# Patient Record
Sex: Female | Born: 1935 | Race: White | Hispanic: No | Smoking: Never smoker
Health system: Southern US, Community
[De-identification: ages and names within clinical notes are randomized; demographics above are authoritative.]

## PROBLEM LIST (undated history)

## (undated) DIAGNOSIS — E0590 Thyrotoxicosis, unspecified without thyrotoxic crisis or storm: Secondary | ICD-10-CM

## (undated) DIAGNOSIS — I509 Heart failure, unspecified: Secondary | ICD-10-CM

## (undated) DIAGNOSIS — J449 Chronic obstructive pulmonary disease, unspecified: Secondary | ICD-10-CM

## (undated) DIAGNOSIS — I10 Essential (primary) hypertension: Secondary | ICD-10-CM

## (undated) DIAGNOSIS — R12 Heartburn: Secondary | ICD-10-CM

## (undated) DIAGNOSIS — I499 Cardiac arrhythmia, unspecified: Secondary | ICD-10-CM

## (undated) DIAGNOSIS — E119 Type 2 diabetes mellitus without complications: Secondary | ICD-10-CM

## (undated) DIAGNOSIS — I519 Heart disease, unspecified: Secondary | ICD-10-CM

## (undated) DIAGNOSIS — I639 Cerebral infarction, unspecified: Secondary | ICD-10-CM

## (undated) DIAGNOSIS — J45909 Unspecified asthma, uncomplicated: Secondary | ICD-10-CM

## (undated) DIAGNOSIS — F32A Depression, unspecified: Secondary | ICD-10-CM

## (undated) DIAGNOSIS — E785 Hyperlipidemia, unspecified: Secondary | ICD-10-CM

## (undated) DIAGNOSIS — M1990 Unspecified osteoarthritis, unspecified site: Secondary | ICD-10-CM

## (undated) DIAGNOSIS — C801 Malignant (primary) neoplasm, unspecified: Secondary | ICD-10-CM

## (undated) DIAGNOSIS — I2510 Atherosclerotic heart disease of native coronary artery without angina pectoris: Secondary | ICD-10-CM

## (undated) DIAGNOSIS — N19 Unspecified kidney failure: Secondary | ICD-10-CM

## (undated) DIAGNOSIS — F329 Major depressive disorder, single episode, unspecified: Secondary | ICD-10-CM

## (undated) DIAGNOSIS — I4891 Unspecified atrial fibrillation: Secondary | ICD-10-CM

## (undated) DIAGNOSIS — I219 Acute myocardial infarction, unspecified: Secondary | ICD-10-CM

## (undated) DIAGNOSIS — G4730 Sleep apnea, unspecified: Secondary | ICD-10-CM

## (undated) HISTORY — DX: Heart disease, unspecified: I51.9

## (undated) HISTORY — DX: Heart failure, unspecified: I50.9

## (undated) HISTORY — DX: Unspecified asthma, uncomplicated: J45.909

## (undated) HISTORY — DX: Sleep apnea, unspecified: G47.30

## (undated) HISTORY — DX: Thyrotoxicosis, unspecified without thyrotoxic crisis or storm: E05.90

## (undated) HISTORY — DX: Acute myocardial infarction, unspecified: I21.9

## (undated) HISTORY — DX: Depression, unspecified: F32.A

## (undated) HISTORY — DX: Unspecified atrial fibrillation: I48.91

## (undated) HISTORY — DX: Cardiac arrhythmia, unspecified: I49.9

## (undated) HISTORY — DX: Hyperlipidemia, unspecified: E78.5

## (undated) HISTORY — DX: Unspecified osteoarthritis, unspecified site: M19.90

## (undated) HISTORY — DX: Unspecified kidney failure: N19

## (undated) HISTORY — PX: EYE SURGERY: SHX253

## (undated) HISTORY — DX: Heartburn: R12

## (undated) HISTORY — DX: Major depressive disorder, single episode, unspecified: F32.9

---

## 1961-05-30 HISTORY — PX: THYROID SURGERY: SHX805

## 1981-05-30 HISTORY — PX: CHOLECYSTECTOMY: SHX55

## 1997-12-05 ENCOUNTER — Emergency Department (HOSPITAL_COMMUNITY): Admission: EM | Admit: 1997-12-05 | Discharge: 1997-12-05 | Payer: Self-pay | Admitting: Emergency Medicine

## 2002-07-14 ENCOUNTER — Encounter: Payer: Self-pay | Admitting: Emergency Medicine

## 2002-07-15 ENCOUNTER — Encounter: Payer: Self-pay | Admitting: Cardiovascular Disease

## 2002-07-16 ENCOUNTER — Inpatient Hospital Stay (HOSPITAL_COMMUNITY): Admission: EM | Admit: 2002-07-16 | Discharge: 2002-07-19 | Payer: Self-pay | Admitting: Emergency Medicine

## 2002-07-19 ENCOUNTER — Inpatient Hospital Stay: Admission: RE | Admit: 2002-07-19 | Discharge: 2002-08-07 | Payer: Self-pay | Admitting: Internal Medicine

## 2002-07-26 ENCOUNTER — Encounter (HOSPITAL_COMMUNITY): Admission: RE | Admit: 2002-07-26 | Discharge: 2002-08-03 | Payer: Self-pay | Admitting: Internal Medicine

## 2002-07-30 ENCOUNTER — Encounter: Payer: Self-pay | Admitting: Neurology

## 2002-08-03 ENCOUNTER — Encounter: Payer: Self-pay | Admitting: Internal Medicine

## 2003-12-19 ENCOUNTER — Other Ambulatory Visit: Payer: Self-pay

## 2003-12-20 ENCOUNTER — Other Ambulatory Visit: Payer: Self-pay

## 2003-12-21 ENCOUNTER — Other Ambulatory Visit: Payer: Self-pay

## 2003-12-23 ENCOUNTER — Inpatient Hospital Stay (HOSPITAL_COMMUNITY): Admission: AD | Admit: 2003-12-23 | Discharge: 2003-12-24 | Payer: Self-pay | Admitting: Cardiovascular Disease

## 2004-02-18 ENCOUNTER — Inpatient Hospital Stay (HOSPITAL_COMMUNITY): Admission: RE | Admit: 2004-02-18 | Discharge: 2004-02-21 | Payer: Self-pay | Admitting: Cardiovascular Disease

## 2004-05-27 ENCOUNTER — Ambulatory Visit (HOSPITAL_COMMUNITY): Admission: RE | Admit: 2004-05-27 | Discharge: 2004-05-28 | Payer: Self-pay | Admitting: Cardiology

## 2005-02-22 ENCOUNTER — Ambulatory Visit: Payer: Self-pay | Admitting: Oncology

## 2005-03-09 ENCOUNTER — Encounter (HOSPITAL_COMMUNITY): Admission: RE | Admit: 2005-03-09 | Discharge: 2005-06-07 | Payer: Self-pay | Admitting: Oncology

## 2005-03-26 ENCOUNTER — Ambulatory Visit: Payer: Self-pay | Admitting: Internal Medicine

## 2005-04-18 ENCOUNTER — Ambulatory Visit: Payer: Self-pay | Admitting: Oncology

## 2005-06-21 ENCOUNTER — Ambulatory Visit: Payer: Self-pay | Admitting: Oncology

## 2005-08-02 ENCOUNTER — Ambulatory Visit: Payer: Self-pay | Admitting: Oncology

## 2005-08-15 ENCOUNTER — Inpatient Hospital Stay (HOSPITAL_COMMUNITY): Admission: EM | Admit: 2005-08-15 | Discharge: 2005-08-19 | Payer: Self-pay | Admitting: Emergency Medicine

## 2005-08-17 ENCOUNTER — Ambulatory Visit: Payer: Self-pay | Admitting: Pulmonary Disease

## 2005-08-19 ENCOUNTER — Encounter: Payer: Self-pay | Admitting: Pulmonary Disease

## 2005-08-26 ENCOUNTER — Ambulatory Visit: Admission: RE | Admit: 2005-08-26 | Discharge: 2005-10-10 | Payer: Self-pay | Admitting: Radiation Oncology

## 2005-08-29 ENCOUNTER — Ambulatory Visit: Payer: Self-pay | Admitting: Pulmonary Disease

## 2005-09-05 ENCOUNTER — Encounter (INDEPENDENT_AMBULATORY_CARE_PROVIDER_SITE_OTHER): Payer: Self-pay | Admitting: *Deleted

## 2005-09-05 ENCOUNTER — Ambulatory Visit (HOSPITAL_COMMUNITY): Admission: RE | Admit: 2005-09-05 | Discharge: 2005-09-05 | Payer: Self-pay | Admitting: Pulmonary Disease

## 2005-09-05 ENCOUNTER — Ambulatory Visit: Payer: Self-pay | Admitting: Pulmonary Disease

## 2005-09-12 ENCOUNTER — Ambulatory Visit: Payer: Self-pay | Admitting: Pulmonary Disease

## 2005-10-11 ENCOUNTER — Ambulatory Visit: Payer: Self-pay | Admitting: Pulmonary Disease

## 2005-10-14 ENCOUNTER — Other Ambulatory Visit: Admission: RE | Admit: 2005-10-14 | Discharge: 2005-10-14 | Payer: Self-pay | Admitting: Radiology

## 2005-10-14 ENCOUNTER — Encounter (INDEPENDENT_AMBULATORY_CARE_PROVIDER_SITE_OTHER): Payer: Self-pay | Admitting: Specialist

## 2005-10-14 ENCOUNTER — Encounter: Admission: RE | Admit: 2005-10-14 | Discharge: 2005-10-14 | Payer: Self-pay | Admitting: Orthopedic Surgery

## 2005-10-23 ENCOUNTER — Emergency Department (HOSPITAL_COMMUNITY): Admission: EM | Admit: 2005-10-23 | Discharge: 2005-10-23 | Payer: Self-pay | Admitting: Emergency Medicine

## 2005-10-25 ENCOUNTER — Ambulatory Visit: Payer: Self-pay | Admitting: Oncology

## 2005-10-26 LAB — CBC WITH DIFFERENTIAL (CANCER CENTER ONLY)
BASO#: 0.1 10*3/uL (ref 0.0–0.2)
Eosinophils Absolute: 0.3 10*3/uL (ref 0.0–0.5)
HCT: 35 % (ref 34.8–46.6)
HGB: 11.8 g/dL (ref 11.6–15.9)
LYMPH%: 24.2 % (ref 14.0–48.0)
MCH: 28.3 pg (ref 26.0–34.0)
MCV: 84 fL (ref 81–101)
MONO#: 0.7 10*3/uL (ref 0.1–0.9)
MONO%: 7.8 % (ref 0.0–13.0)
NEUT%: 63.3 % (ref 39.6–80.0)
Platelets: 213 10*3/uL (ref 145–400)
RBC: 4.17 10*6/uL (ref 3.70–5.32)
WBC: 8.7 10*3/uL (ref 3.9–10.0)

## 2005-10-31 ENCOUNTER — Ambulatory Visit: Payer: Self-pay | Admitting: Pulmonary Disease

## 2005-11-14 ENCOUNTER — Encounter: Admission: RE | Admit: 2005-11-14 | Discharge: 2005-11-14 | Payer: Self-pay | Admitting: Orthopedic Surgery

## 2005-11-14 ENCOUNTER — Encounter (INDEPENDENT_AMBULATORY_CARE_PROVIDER_SITE_OTHER): Payer: Self-pay | Admitting: Specialist

## 2005-12-05 LAB — CBC WITH DIFFERENTIAL (CANCER CENTER ONLY)
BASO#: 0 10*3/uL (ref 0.0–0.2)
HCT: 34.7 % — ABNORMAL LOW (ref 34.8–46.6)
HGB: 11.5 g/dL — ABNORMAL LOW (ref 11.6–15.9)
LYMPH#: 1.8 10*3/uL (ref 0.9–3.3)
MONO#: 0.4 10*3/uL (ref 0.1–0.9)
NEUT%: 65.8 % (ref 39.6–80.0)
RBC: 3.87 10*6/uL (ref 3.70–5.32)
WBC: 7.1 10*3/uL (ref 3.9–10.0)

## 2005-12-08 ENCOUNTER — Ambulatory Visit: Payer: Self-pay | Admitting: Pulmonary Disease

## 2005-12-23 ENCOUNTER — Ambulatory Visit: Payer: Self-pay | Admitting: Oncology

## 2005-12-26 LAB — CBC WITH DIFFERENTIAL (CANCER CENTER ONLY)
BASO#: 0 10*3/uL (ref 0.0–0.2)
EOS%: 3.2 % (ref 0.0–7.0)
Eosinophils Absolute: 0.2 10*3/uL (ref 0.0–0.5)
HCT: 36.6 % (ref 34.8–46.6)
HGB: 11.9 g/dL (ref 11.6–15.9)
LYMPH#: 1.4 10*3/uL (ref 0.9–3.3)
MCH: 28.2 pg (ref 26.0–34.0)
MCHC: 32.4 g/dL (ref 32.0–36.0)
NEUT%: 65.8 % (ref 39.6–80.0)

## 2006-01-04 ENCOUNTER — Encounter: Admission: RE | Admit: 2006-01-04 | Discharge: 2006-01-04 | Payer: Self-pay | Admitting: Orthopedic Surgery

## 2006-01-13 ENCOUNTER — Encounter: Admission: RE | Admit: 2006-01-13 | Discharge: 2006-01-13 | Payer: Self-pay | Admitting: Orthopedic Surgery

## 2006-01-16 LAB — CBC WITH DIFFERENTIAL (CANCER CENTER ONLY)
EOS%: 0.9 % (ref 0.0–7.0)
Eosinophils Absolute: 0.1 10*3/uL (ref 0.0–0.5)
LYMPH%: 10.9 % — ABNORMAL LOW (ref 14.0–48.0)
MCH: 28.3 pg (ref 26.0–34.0)
MCHC: 33.1 g/dL (ref 32.0–36.0)
MCV: 85 fL (ref 81–101)
MONO%: 5.6 % (ref 0.0–13.0)
Platelets: 296 10*3/uL (ref 145–400)
RDW: 12 % (ref 10.5–14.6)

## 2006-01-23 LAB — CBC WITH DIFFERENTIAL (CANCER CENTER ONLY)
BASO%: 0.6 % (ref 0.0–2.0)
LYMPH%: 18.2 % (ref 14.0–48.0)
MCV: 85 fL (ref 81–101)
MONO#: 0.7 10*3/uL (ref 0.1–0.9)
MONO%: 7.2 % (ref 0.0–13.0)
Platelets: 195 10*3/uL (ref 145–400)
RDW: 12.3 % (ref 10.5–14.6)
WBC: 9.6 10*3/uL (ref 3.9–10.0)

## 2006-01-24 ENCOUNTER — Inpatient Hospital Stay (HOSPITAL_COMMUNITY): Admission: RE | Admit: 2006-01-24 | Discharge: 2006-01-28 | Payer: Self-pay | Admitting: Orthopedic Surgery

## 2006-01-24 ENCOUNTER — Encounter (INDEPENDENT_AMBULATORY_CARE_PROVIDER_SITE_OTHER): Payer: Self-pay | Admitting: Specialist

## 2006-02-10 ENCOUNTER — Ambulatory Visit: Payer: Self-pay | Admitting: Oncology

## 2006-02-15 ENCOUNTER — Ambulatory Visit: Payer: Self-pay | Admitting: Orthopedic Surgery

## 2006-02-16 ENCOUNTER — Encounter (HOSPITAL_COMMUNITY): Admission: RE | Admit: 2006-02-16 | Discharge: 2006-02-22 | Payer: Self-pay | Admitting: Oncology

## 2006-02-16 LAB — CBC WITH DIFFERENTIAL (CANCER CENTER ONLY)
BASO%: 0.5 % (ref 0.0–2.0)
HCT: 28 % — ABNORMAL LOW (ref 34.8–46.6)
LYMPH#: 1.3 10*3/uL (ref 0.9–3.3)
MONO#: 0.5 10*3/uL (ref 0.1–0.9)
Platelets: 312 10*3/uL (ref 145–400)
RDW: 12.7 % (ref 10.5–14.6)
WBC: 7.5 10*3/uL (ref 3.9–10.0)

## 2006-04-06 ENCOUNTER — Ambulatory Visit: Payer: Self-pay | Admitting: Oncology

## 2006-04-10 LAB — CBC WITH DIFFERENTIAL (CANCER CENTER ONLY)
BASO#: 0 10*3/uL (ref 0.0–0.2)
Eosinophils Absolute: 0.4 10*3/uL (ref 0.0–0.5)
HGB: 12.2 g/dL (ref 11.6–15.9)
LYMPH%: 21.2 % (ref 14.0–48.0)
MCH: 28.1 pg (ref 26.0–34.0)
MCHC: 33.3 g/dL (ref 32.0–36.0)
MCV: 84 fL (ref 81–101)
MONO%: 4.5 % (ref 0.0–13.0)
NEUT%: 67.4 % (ref 39.6–80.0)
RBC: 4.35 10*6/uL (ref 3.70–5.32)

## 2006-04-10 LAB — IRON AND TIBC: Iron: 81 ug/dL (ref 42–145)

## 2006-05-08 LAB — CBC WITH DIFFERENTIAL (CANCER CENTER ONLY)
BASO#: 0.1 10*3/uL (ref 0.0–0.2)
BASO%: 0.5 % (ref 0.0–2.0)
EOS%: 6 % (ref 0.0–7.0)
Eosinophils Absolute: 0.6 10*3/uL — ABNORMAL HIGH (ref 0.0–0.5)
HCT: 29.6 % — ABNORMAL LOW (ref 34.8–46.6)
HGB: 9.8 g/dL — ABNORMAL LOW (ref 11.6–15.9)
LYMPH#: 1.7 10*3/uL (ref 0.9–3.3)
LYMPH%: 18.5 % (ref 14.0–48.0)
MCH: 27.6 pg (ref 26.0–34.0)
MCHC: 32.9 g/dL (ref 32.0–36.0)
MCV: 84 fL (ref 81–101)
MONO#: 0.5 10*3/uL (ref 0.1–0.9)
MONO%: 5.1 % (ref 0.0–13.0)
NEUT#: 6.4 10*3/uL (ref 1.5–6.5)
NEUT%: 69.9 % (ref 39.6–80.0)
Platelets: 280 10*3/uL (ref 145–400)
RBC: 3.54 10*6/uL — ABNORMAL LOW (ref 3.70–5.32)
RDW: 12.4 % (ref 10.5–14.6)
WBC: 9.2 10*3/uL (ref 3.9–10.0)

## 2006-05-12 ENCOUNTER — Inpatient Hospital Stay: Payer: Self-pay | Admitting: Internal Medicine

## 2006-05-13 ENCOUNTER — Other Ambulatory Visit: Payer: Self-pay

## 2006-05-30 HISTORY — PX: TOTAL HIP ARTHROPLASTY: SHX124

## 2006-06-02 ENCOUNTER — Ambulatory Visit: Payer: Self-pay | Admitting: Oncology

## 2006-06-05 LAB — CBC WITH DIFFERENTIAL (CANCER CENTER ONLY)
BASO#: 0 10*3/uL (ref 0.0–0.2)
Eosinophils Absolute: 0.3 10*3/uL (ref 0.0–0.5)
HGB: 12.2 g/dL (ref 11.6–15.9)
MCH: 28.3 pg (ref 26.0–34.0)
MONO#: 0.3 10*3/uL (ref 0.1–0.9)
NEUT#: 4.5 10*3/uL (ref 1.5–6.5)
Platelets: 164 10*3/uL (ref 145–400)
RBC: 4.31 10*6/uL (ref 3.70–5.32)
WBC: 6.4 10*3/uL (ref 3.9–10.0)

## 2006-06-28 ENCOUNTER — Encounter: Admission: RE | Admit: 2006-06-28 | Discharge: 2006-06-28 | Payer: Self-pay | Admitting: Thoracic Surgery

## 2006-07-04 LAB — CBC WITH DIFFERENTIAL (CANCER CENTER ONLY)
BASO#: 0 10*3/uL (ref 0.0–0.2)
EOS%: 5.7 % (ref 0.0–7.0)
HGB: 10.7 g/dL — ABNORMAL LOW (ref 11.6–15.9)
LYMPH%: 21.2 % (ref 14.0–48.0)
MCH: 29.4 pg (ref 26.0–34.0)
MCHC: 34.4 g/dL (ref 32.0–36.0)
MCV: 85 fL (ref 81–101)
MONO%: 6.2 % (ref 0.0–13.0)
NEUT#: 4.9 10*3/uL (ref 1.5–6.5)
NEUT%: 66.5 % (ref 39.6–80.0)

## 2006-07-21 ENCOUNTER — Ambulatory Visit: Payer: Self-pay | Admitting: Pulmonary Disease

## 2006-07-31 ENCOUNTER — Ambulatory Visit: Payer: Self-pay | Admitting: Oncology

## 2006-08-01 LAB — BASIC METABOLIC PANEL - CANCER CENTER ONLY
CO2: 32 mEq/L (ref 18–33)
Glucose, Bld: 150 mg/dL — ABNORMAL HIGH (ref 73–118)
Potassium: 3.6 mEq/L (ref 3.3–4.7)
Sodium: 137 mEq/L (ref 128–145)

## 2006-08-01 LAB — CBC WITH DIFFERENTIAL (CANCER CENTER ONLY)
BASO%: 0.3 % (ref 0.0–2.0)
HCT: 33.1 % — ABNORMAL LOW (ref 34.8–46.6)
LYMPH%: 23.7 % (ref 14.0–48.0)
MCH: 29.7 pg (ref 26.0–34.0)
MCV: 87 fL (ref 81–101)
MONO#: 0.3 10*3/uL (ref 0.1–0.9)
MONO%: 4.8 % (ref 0.0–13.0)
NEUT%: 66.4 % (ref 39.6–80.0)
Platelets: 251 10*3/uL (ref 145–400)
RDW: 14.1 % (ref 10.5–14.6)
WBC: 5.9 10*3/uL (ref 3.9–10.0)

## 2006-08-22 LAB — CBC WITH DIFFERENTIAL (CANCER CENTER ONLY)
BASO%: 0.5 % (ref 0.0–2.0)
EOS%: 4.3 % (ref 0.0–7.0)
HCT: 38 % (ref 34.8–46.6)
LYMPH#: 1.8 10*3/uL (ref 0.9–3.3)
LYMPH%: 23.6 % (ref 14.0–48.0)
MCHC: 33.6 g/dL (ref 32.0–36.0)
MCV: 87 fL (ref 81–101)
NEUT%: 64.6 % (ref 39.6–80.0)
Platelets: 212 10*3/uL (ref 145–400)
RDW: 11.9 % (ref 10.5–14.6)

## 2006-08-23 ENCOUNTER — Ambulatory Visit: Payer: Self-pay | Admitting: Pulmonary Disease

## 2006-09-12 LAB — CBC WITH DIFFERENTIAL (CANCER CENTER ONLY)
BASO%: 0.4 % (ref 0.0–2.0)
EOS%: 3.2 % (ref 0.0–7.0)
LYMPH#: 1.6 10*3/uL (ref 0.9–3.3)
MCHC: 34 g/dL (ref 32.0–36.0)
NEUT#: 5.1 10*3/uL (ref 1.5–6.5)
RDW: 11.9 % (ref 10.5–14.6)
WBC: 7.3 10*3/uL (ref 3.9–10.0)

## 2006-09-13 ENCOUNTER — Ambulatory Visit: Payer: BLUE CROSS/BLUE SHIELD

## 2006-09-27 ENCOUNTER — Ambulatory Visit: Payer: Self-pay | Admitting: Thoracic Surgery

## 2006-09-27 ENCOUNTER — Encounter: Admission: RE | Admit: 2006-09-27 | Discharge: 2006-09-27 | Payer: Self-pay | Admitting: Thoracic Surgery

## 2006-10-02 ENCOUNTER — Ambulatory Visit: Payer: Self-pay | Admitting: Oncology

## 2006-10-03 LAB — CBC WITH DIFFERENTIAL (CANCER CENTER ONLY)
BASO#: 0.1 10*3/uL (ref 0.0–0.2)
BASO%: 0.5 % (ref 0.0–2.0)
EOS%: 0.7 % (ref 0.0–7.0)
Eosinophils Absolute: 0.1 10*3/uL (ref 0.0–0.5)
HCT: 36.1 % (ref 34.8–46.6)
HGB: 12.1 g/dL (ref 11.6–15.9)
LYMPH#: 1.6 10*3/uL (ref 0.9–3.3)
LYMPH%: 14.5 % (ref 14.0–48.0)
MCH: 28.7 pg (ref 26.0–34.0)
MCHC: 33.4 g/dL (ref 32.0–36.0)
MCV: 86 fL (ref 81–101)
MONO#: 0.5 10*3/uL (ref 0.1–0.9)
MONO%: 4.2 % (ref 0.0–13.0)
NEUT#: 8.8 10*3/uL — ABNORMAL HIGH (ref 1.5–6.5)
NEUT%: 80.1 % — ABNORMAL HIGH (ref 39.6–80.0)
Platelets: 263 10*3/uL (ref 145–400)
RBC: 4.2 10*6/uL (ref 3.70–5.32)
RDW: 12.5 % (ref 10.5–14.6)
WBC: 11 10*3/uL — ABNORMAL HIGH (ref 3.9–10.0)

## 2006-10-11 ENCOUNTER — Ambulatory Visit: Payer: Self-pay | Admitting: Pulmonary Disease

## 2006-10-24 LAB — CBC WITH DIFFERENTIAL (CANCER CENTER ONLY)
BASO%: 0.7 % (ref 0.0–2.0)
LYMPH%: 24.2 % (ref 14.0–48.0)
MCH: 28.6 pg (ref 26.0–34.0)
MCHC: 34.4 g/dL (ref 32.0–36.0)
MCV: 83 fL (ref 81–101)
MONO%: 5.7 % (ref 0.0–13.0)
Platelets: 187 10*3/uL (ref 145–400)
RDW: 13.4 % (ref 10.5–14.6)

## 2006-11-20 ENCOUNTER — Ambulatory Visit: Payer: Self-pay | Admitting: Oncology

## 2006-11-21 LAB — CBC WITH DIFFERENTIAL (CANCER CENTER ONLY)
BASO%: 0.7 % (ref 0.0–2.0)
EOS%: 1.3 % (ref 0.0–7.0)
LYMPH#: 1.3 10*3/uL (ref 0.9–3.3)
LYMPH%: 11.6 % — ABNORMAL LOW (ref 14.0–48.0)
MCHC: 34.2 g/dL (ref 32.0–36.0)
MONO#: 0.7 10*3/uL (ref 0.1–0.9)
Platelets: 263 10*3/uL (ref 145–400)
RDW: 13.5 % (ref 10.5–14.6)
WBC: 11.6 10*3/uL — ABNORMAL HIGH (ref 3.9–10.0)

## 2006-12-18 LAB — CBC WITH DIFFERENTIAL (CANCER CENTER ONLY)
BASO%: 0.6 % (ref 0.0–2.0)
HCT: 37.5 % (ref 34.8–46.6)
HGB: 12.5 g/dL (ref 11.6–15.9)
LYMPH#: 1.7 10*3/uL (ref 0.9–3.3)
MONO#: 0.5 10*3/uL (ref 0.1–0.9)
NEUT#: 5.5 10*3/uL (ref 1.5–6.5)
NEUT%: 69.8 % (ref 39.6–80.0)
RDW: 12.9 % (ref 10.5–14.6)
WBC: 7.9 10*3/uL (ref 3.9–10.0)

## 2007-01-11 ENCOUNTER — Ambulatory Visit: Payer: Self-pay | Admitting: Oncology

## 2007-01-15 LAB — CBC WITH DIFFERENTIAL (CANCER CENTER ONLY)
BASO#: 0 10*3/uL (ref 0.0–0.2)
Eosinophils Absolute: 0.3 10*3/uL (ref 0.0–0.5)
HGB: 12 g/dL (ref 11.6–15.9)
MCH: 28.9 pg (ref 26.0–34.0)
MONO%: 5.6 % (ref 0.0–13.0)
NEUT#: 5.8 10*3/uL (ref 1.5–6.5)
RBC: 4.16 10*6/uL (ref 3.70–5.32)

## 2007-02-12 LAB — CBC WITH DIFFERENTIAL (CANCER CENTER ONLY)
BASO#: 0 10*3/uL (ref 0.0–0.2)
Eosinophils Absolute: 0.4 10*3/uL (ref 0.0–0.5)
HGB: 11.7 g/dL (ref 11.6–15.9)
LYMPH%: 22.6 % (ref 14.0–48.0)
MCH: 29.4 pg (ref 26.0–34.0)
MCV: 87 fL (ref 81–101)
MONO%: 6.5 % (ref 0.0–13.0)
NEUT%: 65.7 % (ref 39.6–80.0)
Platelets: 222 10*3/uL (ref 145–400)
RBC: 3.99 10*6/uL (ref 3.70–5.32)

## 2007-03-07 ENCOUNTER — Encounter: Admission: RE | Admit: 2007-03-07 | Discharge: 2007-03-07 | Payer: Self-pay | Admitting: Thoracic Surgery

## 2007-03-07 ENCOUNTER — Ambulatory Visit: Payer: Self-pay | Admitting: Thoracic Surgery

## 2007-03-09 ENCOUNTER — Ambulatory Visit: Payer: Self-pay | Admitting: Oncology

## 2007-03-13 LAB — CBC WITH DIFFERENTIAL (CANCER CENTER ONLY)
Eosinophils Absolute: 0.6 10*3/uL — ABNORMAL HIGH (ref 0.0–0.5)
LYMPH#: 1.9 10*3/uL (ref 0.9–3.3)
MONO#: 0.4 10*3/uL (ref 0.1–0.9)
NEUT#: 6.3 10*3/uL (ref 1.5–6.5)
Platelets: 275 10*3/uL (ref 145–400)
RBC: 4.02 10*6/uL (ref 3.70–5.32)
WBC: 9.2 10*3/uL (ref 3.9–10.0)

## 2007-03-15 DIAGNOSIS — J449 Chronic obstructive pulmonary disease, unspecified: Secondary | ICD-10-CM

## 2007-03-15 DIAGNOSIS — IMO0001 Reserved for inherently not codable concepts without codable children: Secondary | ICD-10-CM | POA: Insufficient documentation

## 2007-03-15 DIAGNOSIS — G4733 Obstructive sleep apnea (adult) (pediatric): Secondary | ICD-10-CM | POA: Insufficient documentation

## 2007-03-15 DIAGNOSIS — I2510 Atherosclerotic heart disease of native coronary artery without angina pectoris: Secondary | ICD-10-CM | POA: Insufficient documentation

## 2007-03-15 DIAGNOSIS — J4489 Other specified chronic obstructive pulmonary disease: Secondary | ICD-10-CM | POA: Insufficient documentation

## 2007-04-09 LAB — CBC WITH DIFFERENTIAL (CANCER CENTER ONLY)
Eosinophils Absolute: 0.5 10*3/uL (ref 0.0–0.5)
HCT: 32 % — ABNORMAL LOW (ref 34.8–46.6)
HGB: 10.9 g/dL — ABNORMAL LOW (ref 11.6–15.9)
LYMPH%: 19.9 % (ref 14.0–48.0)
MCV: 87 fL (ref 81–101)
MONO#: 0.5 10*3/uL (ref 0.1–0.9)
Platelets: 255 10*3/uL (ref 145–400)
RBC: 3.68 10*6/uL — ABNORMAL LOW (ref 3.70–5.32)
WBC: 8.1 10*3/uL (ref 3.9–10.0)

## 2007-05-02 ENCOUNTER — Ambulatory Visit: Payer: BLUE CROSS/BLUE SHIELD | Admitting: Gastroenterology

## 2007-05-04 ENCOUNTER — Ambulatory Visit: Payer: Self-pay | Admitting: Oncology

## 2007-05-07 LAB — CBC WITH DIFFERENTIAL (CANCER CENTER ONLY)
BASO%: 0.6 % (ref 0.0–2.0)
Eosinophils Absolute: 0.5 10*3/uL (ref 0.0–0.5)
HCT: 33.8 % — ABNORMAL LOW (ref 34.8–46.6)
LYMPH%: 21.3 % (ref 14.0–48.0)
MCV: 85 fL (ref 81–101)
MONO#: 0.5 10*3/uL (ref 0.1–0.9)
NEUT%: 65.6 % (ref 39.6–80.0)
RDW: 12.1 % (ref 10.5–14.6)
WBC: 7.5 10*3/uL (ref 3.9–10.0)

## 2007-06-04 LAB — CBC WITH DIFFERENTIAL (CANCER CENTER ONLY)
EOS%: 5.3 % (ref 0.0–7.0)
MCH: 28.1 pg (ref 26.0–34.0)
MCHC: 33.8 g/dL (ref 32.0–36.0)
MONO%: 5.2 % (ref 0.0–13.0)
NEUT#: 5.5 10*3/uL (ref 1.5–6.5)
Platelets: 251 10*3/uL (ref 145–400)
RBC: 4.21 10*6/uL (ref 3.70–5.32)

## 2007-06-28 ENCOUNTER — Ambulatory Visit: Payer: Self-pay | Admitting: Oncology

## 2007-07-02 LAB — CBC WITH DIFFERENTIAL (CANCER CENTER ONLY)
BASO#: 0.1 10*3/uL (ref 0.0–0.2)
BASO%: 0.7 % (ref 0.0–2.0)
EOS%: 5.2 % (ref 0.0–7.0)
HCT: 34.8 % (ref 34.8–46.6)
HGB: 11.7 g/dL (ref 11.6–15.9)
LYMPH#: 1.8 10*3/uL (ref 0.9–3.3)
MCHC: 33.5 g/dL (ref 32.0–36.0)
MONO#: 0.5 10*3/uL (ref 0.1–0.9)
NEUT#: 5.2 10*3/uL (ref 1.5–6.5)
NEUT%: 66.1 % (ref 39.6–80.0)
WBC: 7.9 10*3/uL (ref 3.9–10.0)

## 2007-07-04 ENCOUNTER — Encounter: Admission: RE | Admit: 2007-07-04 | Discharge: 2007-07-04 | Payer: Self-pay | Admitting: Thoracic Surgery

## 2007-07-04 ENCOUNTER — Ambulatory Visit: Payer: Self-pay | Admitting: Thoracic Surgery

## 2007-07-31 LAB — CBC WITH DIFFERENTIAL (CANCER CENTER ONLY)
BASO%: 0.5 % (ref 0.0–2.0)
EOS%: 3.7 % (ref 0.0–7.0)
LYMPH#: 2 10*3/uL (ref 0.9–3.3)
LYMPH%: 24 % (ref 14.0–48.0)
MCHC: 33 g/dL (ref 32.0–36.0)
MCV: 83 fL (ref 81–101)
MONO#: 0.6 10*3/uL (ref 0.1–0.9)
Platelets: 266 10*3/uL (ref 145–400)
RDW: 13.5 % (ref 10.5–14.6)
WBC: 8.4 10*3/uL (ref 3.9–10.0)

## 2007-07-31 LAB — IRON AND TIBC
%SAT: 27 % (ref 20–55)
Iron: 81 ug/dL (ref 42–145)
TIBC: 296 ug/dL (ref 250–470)
UIBC: 215 ug/dL

## 2007-07-31 LAB — FERRITIN: Ferritin: 90 ng/mL (ref 10–291)

## 2007-08-03 ENCOUNTER — Ambulatory Visit (HOSPITAL_COMMUNITY): Admission: RE | Admit: 2007-08-03 | Discharge: 2007-08-03 | Payer: Self-pay | Admitting: Thoracic Surgery

## 2007-08-03 ENCOUNTER — Encounter: Payer: Self-pay | Admitting: Thoracic Surgery

## 2007-08-07 ENCOUNTER — Ambulatory Visit: Payer: Self-pay | Admitting: Thoracic Surgery

## 2007-08-15 ENCOUNTER — Observation Stay (HOSPITAL_COMMUNITY): Admission: AD | Admit: 2007-08-15 | Discharge: 2007-08-17 | Payer: Self-pay | Admitting: Thoracic Surgery

## 2007-08-16 ENCOUNTER — Encounter (INDEPENDENT_AMBULATORY_CARE_PROVIDER_SITE_OTHER): Payer: Self-pay | Admitting: Interventional Radiology

## 2007-08-21 ENCOUNTER — Ambulatory Visit: Payer: Self-pay | Admitting: Thoracic Surgery

## 2007-08-21 ENCOUNTER — Encounter: Admission: RE | Admit: 2007-08-21 | Discharge: 2007-08-21 | Payer: Self-pay | Admitting: Thoracic Surgery

## 2007-08-22 ENCOUNTER — Ambulatory Visit: Payer: Self-pay | Admitting: Oncology

## 2007-08-23 ENCOUNTER — Ambulatory Visit (HOSPITAL_COMMUNITY): Admission: RE | Admit: 2007-08-23 | Discharge: 2007-08-23 | Payer: Self-pay | Admitting: Thoracic Surgery

## 2007-08-27 ENCOUNTER — Ambulatory Visit: Admission: RE | Admit: 2007-08-27 | Discharge: 2007-11-25 | Payer: Self-pay | Admitting: Radiation Oncology

## 2007-08-27 LAB — CBC WITH DIFFERENTIAL (CANCER CENTER ONLY)
BASO%: 0.6 % (ref 0.0–2.0)
EOS%: 5.3 % (ref 0.0–7.0)
LYMPH#: 1.8 10*3/uL (ref 0.9–3.3)
MCHC: 33.6 g/dL (ref 32.0–36.0)
MONO#: 0.5 10*3/uL (ref 0.1–0.9)
NEUT#: 5.6 10*3/uL (ref 1.5–6.5)
NEUT%: 67 % (ref 39.6–80.0)
Platelets: 248 10*3/uL (ref 145–400)
RDW: 14 % (ref 10.5–14.6)
WBC: 8.3 10*3/uL (ref 3.9–10.0)

## 2007-09-04 ENCOUNTER — Ambulatory Visit: Payer: Self-pay | Admitting: Thoracic Surgery

## 2007-09-11 LAB — CBC WITH DIFFERENTIAL (CANCER CENTER ONLY)
BASO%: 0.5 % (ref 0.0–2.0)
EOS%: 3.7 % (ref 0.0–7.0)
HCT: 35.4 % (ref 34.8–46.6)
LYMPH%: 21.2 % (ref 14.0–48.0)
MCHC: 33.4 g/dL (ref 32.0–36.0)
MCV: 83 fL (ref 81–101)
MONO#: 0.5 10*3/uL (ref 0.1–0.9)
MONO%: 6 % (ref 0.0–13.0)
NEUT%: 68.6 % (ref 39.6–80.0)
Platelets: 241 10*3/uL (ref 145–400)
RDW: 13.8 % (ref 10.5–14.6)
WBC: 8.2 10*3/uL (ref 3.9–10.0)

## 2007-09-24 LAB — CBC WITH DIFFERENTIAL (CANCER CENTER ONLY)
BASO%: 0.7 % (ref 0.0–2.0)
HCT: 34.8 % (ref 34.8–46.6)
LYMPH#: 2 10*3/uL (ref 0.9–3.3)
MONO#: 0.5 10*3/uL (ref 0.1–0.9)
NEUT#: 6.2 10*3/uL (ref 1.5–6.5)
NEUT%: 68 % (ref 39.6–80.0)
RDW: 14 % (ref 10.5–14.6)
WBC: 9.1 10*3/uL (ref 3.9–10.0)

## 2007-10-18 ENCOUNTER — Ambulatory Visit: Payer: Self-pay | Admitting: Oncology

## 2007-12-14 ENCOUNTER — Ambulatory Visit: Payer: Self-pay | Admitting: Oncology

## 2007-12-17 LAB — CBC WITH DIFFERENTIAL (CANCER CENTER ONLY)
BASO#: 0.1 10*3/uL (ref 0.0–0.2)
Eosinophils Absolute: 0.2 10*3/uL (ref 0.0–0.5)
HCT: 31.8 % — ABNORMAL LOW (ref 34.8–46.6)
LYMPH%: 22.5 % (ref 14.0–48.0)
MCHC: 34.2 g/dL (ref 32.0–36.0)
MONO%: 6.7 % (ref 0.0–13.0)
NEUT#: 8.2 10*3/uL — ABNORMAL HIGH (ref 1.5–6.5)
NEUT%: 68.7 % (ref 39.6–80.0)
Platelets: 275 10*3/uL (ref 145–400)
RDW: 13.8 % (ref 10.5–14.6)
WBC: 11.9 10*3/uL — ABNORMAL HIGH (ref 3.9–10.0)

## 2008-01-14 LAB — CBC WITH DIFFERENTIAL (CANCER CENTER ONLY)
BASO%: 0.5 % (ref 0.0–2.0)
LYMPH%: 24.4 % (ref 14.0–48.0)
MCH: 28.3 pg (ref 26.0–34.0)
MCV: 83 fL (ref 81–101)
MONO%: 5.2 % (ref 0.0–13.0)
Platelets: 307 10*3/uL (ref 145–400)
RDW: 13.9 % (ref 10.5–14.6)

## 2008-02-06 ENCOUNTER — Ambulatory Visit: Payer: Self-pay | Admitting: Oncology

## 2008-02-11 LAB — CBC WITH DIFFERENTIAL (CANCER CENTER ONLY)
BASO%: 0.5 % (ref 0.0–2.0)
EOS%: 3.6 % (ref 0.0–7.0)
HCT: 33.8 % — ABNORMAL LOW (ref 34.8–46.6)
LYMPH%: 20.9 % (ref 14.0–48.0)
MCHC: 33.7 g/dL (ref 32.0–36.0)
MCV: 85 fL (ref 81–101)
MONO#: 0.4 10*3/uL (ref 0.1–0.9)
NEUT%: 70 % (ref 39.6–80.0)
Platelets: 265 10*3/uL (ref 145–400)
RDW: 12.7 % (ref 10.5–14.6)
WBC: 8.2 10*3/uL (ref 3.9–10.0)

## 2008-03-10 LAB — CBC WITH DIFFERENTIAL (CANCER CENTER ONLY)
BASO%: 0.6 % (ref 0.0–2.0)
HCT: 36.5 % (ref 34.8–46.6)
LYMPH%: 20.1 % (ref 14.0–48.0)
MCH: 28.3 pg (ref 26.0–34.0)
MCV: 84 fL (ref 81–101)
MONO%: 4.1 % (ref 0.0–13.0)
NEUT%: 70.9 % (ref 39.6–80.0)
Platelets: 274 10*3/uL (ref 145–400)
RDW: 12.4 % (ref 10.5–14.6)

## 2008-04-04 ENCOUNTER — Ambulatory Visit: Payer: Self-pay | Admitting: Oncology

## 2008-04-07 LAB — CBC WITH DIFFERENTIAL (CANCER CENTER ONLY)
BASO#: 0 10*3/uL (ref 0.0–0.2)
Eosinophils Absolute: 0.3 10*3/uL (ref 0.0–0.5)
HCT: 33.1 % — ABNORMAL LOW (ref 34.8–46.6)
HGB: 11.1 g/dL — ABNORMAL LOW (ref 11.6–15.9)
LYMPH%: 21.1 % (ref 14.0–48.0)
MCH: 28.5 pg (ref 26.0–34.0)
MCV: 85 fL (ref 81–101)
MONO#: 0.4 10*3/uL (ref 0.1–0.9)
MONO%: 4.8 % (ref 0.0–13.0)
NEUT%: 69.2 % (ref 39.6–80.0)
RBC: 3.91 10*6/uL (ref 3.70–5.32)

## 2008-05-06 LAB — CBC WITH DIFFERENTIAL (CANCER CENTER ONLY)
BASO#: 0 10*3/uL (ref 0.0–0.2)
Eosinophils Absolute: 0.3 10*3/uL (ref 0.0–0.5)
HGB: 11.3 g/dL — ABNORMAL LOW (ref 11.6–15.9)
LYMPH#: 1.6 10*3/uL (ref 0.9–3.3)
MCH: 28.1 pg (ref 26.0–34.0)
MONO#: 0.4 10*3/uL (ref 0.1–0.9)
MONO%: 4.8 % (ref 0.0–13.0)
NEUT#: 5.2 10*3/uL (ref 1.5–6.5)
RBC: 4.03 10*6/uL (ref 3.70–5.32)
WBC: 7.5 10*3/uL (ref 3.9–10.0)

## 2008-06-09 ENCOUNTER — Ambulatory Visit: Payer: Self-pay | Admitting: Oncology

## 2008-06-10 LAB — CBC WITH DIFFERENTIAL (CANCER CENTER ONLY)
BASO#: 0 10*3/uL (ref 0.0–0.2)
EOS%: 4.5 % (ref 0.0–7.0)
HGB: 11.3 g/dL — ABNORMAL LOW (ref 11.6–15.9)
LYMPH#: 1.3 10*3/uL (ref 0.9–3.3)
MCHC: 33.6 g/dL (ref 32.0–36.0)
NEUT#: 6 10*3/uL (ref 1.5–6.5)
RBC: 4 10*6/uL (ref 3.70–5.32)

## 2008-07-03 ENCOUNTER — Ambulatory Visit (HOSPITAL_COMMUNITY): Admission: RE | Admit: 2008-07-03 | Discharge: 2008-07-03 | Payer: Self-pay | Admitting: Oncology

## 2008-07-08 ENCOUNTER — Ambulatory Visit: Admission: RE | Admit: 2008-07-08 | Discharge: 2008-08-18 | Payer: Self-pay | Admitting: Radiation Oncology

## 2008-07-08 LAB — CBC WITH DIFFERENTIAL (CANCER CENTER ONLY)
BASO%: 0.6 % (ref 0.0–2.0)
EOS%: 4.8 % (ref 0.0–7.0)
LYMPH#: 1.3 10*3/uL (ref 0.9–3.3)
MCHC: 33 g/dL (ref 32.0–36.0)
NEUT#: 5.1 10*3/uL (ref 1.5–6.5)
RDW: 12.8 % (ref 10.5–14.6)

## 2008-07-08 LAB — CMP (CANCER CENTER ONLY)
ALT(SGPT): 15 U/L (ref 10–47)
AST: 20 U/L (ref 11–38)
Albumin: 3.7 g/dL (ref 3.3–5.5)
BUN, Bld: 14 mg/dL (ref 7–22)
CO2: 32 mEq/L (ref 18–33)
Calcium: 9.6 mg/dL (ref 8.0–10.3)
Chloride: 102 mEq/L (ref 98–108)
Creat: 1 mg/dl (ref 0.6–1.2)
Potassium: 4.5 mEq/L (ref 3.3–4.7)

## 2008-07-08 LAB — LACTATE DEHYDROGENASE: LDH: 174 U/L (ref 94–250)

## 2008-07-15 ENCOUNTER — Ambulatory Visit (HOSPITAL_COMMUNITY): Admission: RE | Admit: 2008-07-15 | Discharge: 2008-07-15 | Payer: Self-pay | Admitting: Oncology

## 2008-07-21 ENCOUNTER — Ambulatory Visit: Payer: Self-pay | Admitting: Oncology

## 2008-07-22 LAB — CBC WITH DIFFERENTIAL/PLATELET
Basophils Absolute: 0 10*3/uL (ref 0.0–0.1)
EOS%: 4 % (ref 0.0–7.0)
Eosinophils Absolute: 0.3 10*3/uL (ref 0.0–0.5)
HCT: 31.9 % — ABNORMAL LOW (ref 34.8–46.6)
HGB: 11 g/dL — ABNORMAL LOW (ref 11.6–15.9)
MCH: 29.4 pg (ref 25.1–34.0)
MONO#: 0.4 10*3/uL (ref 0.1–0.9)
NEUT#: 6.3 10*3/uL (ref 1.5–6.5)
NEUT%: 77 % — ABNORMAL HIGH (ref 38.4–76.8)
RDW: 13.9 % (ref 11.2–14.5)
WBC: 8.2 10*3/uL (ref 3.9–10.3)
lymph#: 1.1 10*3/uL (ref 0.9–3.3)

## 2008-07-22 LAB — COMPREHENSIVE METABOLIC PANEL
AST: 20 U/L (ref 0–37)
Albumin: 3.6 g/dL (ref 3.5–5.2)
BUN: 16 mg/dL (ref 6–23)
CO2: 30 mEq/L (ref 19–32)
Calcium: 8.9 mg/dL (ref 8.4–10.5)
Chloride: 103 mEq/L (ref 96–112)
Creatinine, Ser: 0.77 mg/dL (ref 0.40–1.20)
Glucose, Bld: 221 mg/dL — ABNORMAL HIGH (ref 70–99)
Potassium: 3.7 mEq/L (ref 3.5–5.3)

## 2008-07-23 ENCOUNTER — Inpatient Hospital Stay (HOSPITAL_COMMUNITY): Admission: EM | Admit: 2008-07-23 | Discharge: 2008-07-27 | Payer: Self-pay | Admitting: Emergency Medicine

## 2008-07-24 ENCOUNTER — Encounter (INDEPENDENT_AMBULATORY_CARE_PROVIDER_SITE_OTHER): Payer: Self-pay | Admitting: Cardiology

## 2008-07-24 ENCOUNTER — Ambulatory Visit: Payer: Self-pay | Admitting: Thoracic Surgery

## 2008-07-25 ENCOUNTER — Ambulatory Visit: Payer: Self-pay | Admitting: Hematology and Oncology

## 2008-07-25 ENCOUNTER — Ambulatory Visit: Payer: Self-pay | Admitting: Oncology

## 2008-07-29 LAB — BASIC METABOLIC PANEL - CANCER CENTER ONLY
BUN, Bld: 48 mg/dL — ABNORMAL HIGH (ref 7–22)
Calcium: 9.5 mg/dL (ref 8.0–10.3)
Potassium: 5.4 mEq/L — ABNORMAL HIGH (ref 3.3–4.7)
Sodium: 139 mEq/L (ref 128–145)

## 2008-07-29 LAB — CBC WITH DIFFERENTIAL (CANCER CENTER ONLY)
BASO%: 0.6 % (ref 0.0–2.0)
EOS%: 4.5 % (ref 0.0–7.0)
LYMPH#: 1.7 10*3/uL (ref 0.9–3.3)
LYMPH%: 17.7 % (ref 14.0–48.0)
MCV: 84 fL (ref 81–101)
MONO#: 0.6 10*3/uL (ref 0.1–0.9)
Platelets: 239 10*3/uL (ref 145–400)
RDW: 13.3 % (ref 10.5–14.6)
WBC: 9.7 10*3/uL (ref 3.9–10.0)

## 2008-08-05 LAB — CBC WITH DIFFERENTIAL (CANCER CENTER ONLY)
BASO%: 0.3 % (ref 0.0–2.0)
HCT: 31 % — ABNORMAL LOW (ref 34.8–46.6)
LYMPH#: 0.7 10*3/uL — ABNORMAL LOW (ref 0.9–3.3)
MONO#: 0.3 10*3/uL (ref 0.1–0.9)
NEUT#: 5.7 10*3/uL (ref 1.5–6.5)
NEUT%: 82.3 % — ABNORMAL HIGH (ref 39.6–80.0)
RDW: 13.8 % (ref 10.5–14.6)
WBC: 7 10*3/uL (ref 3.9–10.0)

## 2008-08-05 LAB — CMP (CANCER CENTER ONLY)
ALT(SGPT): 17 U/L (ref 10–47)
Albumin: 3.4 g/dL (ref 3.3–5.5)
Alkaline Phosphatase: 55 U/L (ref 26–84)
CO2: 27 mEq/L (ref 18–33)
Glucose, Bld: 186 mg/dL — ABNORMAL HIGH (ref 73–118)
Potassium: 4.3 mEq/L (ref 3.3–4.7)
Sodium: 140 mEq/L (ref 128–145)
Total Protein: 6.8 g/dL (ref 6.4–8.1)

## 2008-08-13 LAB — CMP (CANCER CENTER ONLY)
AST: 19 U/L (ref 11–38)
Albumin: 3.3 g/dL (ref 3.3–5.5)
Alkaline Phosphatase: 53 U/L (ref 26–84)
BUN, Bld: 16 mg/dL (ref 7–22)
Glucose, Bld: 251 mg/dL — ABNORMAL HIGH (ref 73–118)
Potassium: 5 mEq/L — ABNORMAL HIGH (ref 3.3–4.7)
Total Bilirubin: 0.5 mg/dl (ref 0.20–1.60)

## 2008-08-13 LAB — CBC WITH DIFFERENTIAL (CANCER CENTER ONLY)
BASO%: 0.5 % (ref 0.0–2.0)
EOS%: 3.1 % (ref 0.0–7.0)
HCT: 33.1 % — ABNORMAL LOW (ref 34.8–46.6)
LYMPH%: 10.2 % — ABNORMAL LOW (ref 14.0–48.0)
MCHC: 33.1 g/dL (ref 32.0–36.0)
MCV: 85 fL (ref 81–101)
NEUT%: 77.2 % (ref 39.6–80.0)
RDW: 13.6 % (ref 10.5–14.6)

## 2008-09-03 LAB — CBC WITH DIFFERENTIAL (CANCER CENTER ONLY)
BASO#: 0 10*3/uL (ref 0.0–0.2)
Eosinophils Absolute: 0.1 10*3/uL (ref 0.0–0.5)
HGB: 10.9 g/dL — ABNORMAL LOW (ref 11.6–15.9)
LYMPH%: 13.1 % — ABNORMAL LOW (ref 14.0–48.0)
MCH: 29.7 pg (ref 26.0–34.0)
MCV: 87 fL (ref 81–101)
MONO%: 8.9 % (ref 0.0–13.0)
RBC: 3.66 10*6/uL — ABNORMAL LOW (ref 3.70–5.32)

## 2008-09-09 ENCOUNTER — Ambulatory Visit: Payer: Self-pay | Admitting: Oncology

## 2008-09-10 LAB — CMP (CANCER CENTER ONLY)
ALT(SGPT): 23 U/L (ref 10–47)
Albumin: 3.5 g/dL (ref 3.3–5.5)
CO2: 29 mEq/L (ref 18–33)
Glucose, Bld: 302 mg/dL — ABNORMAL HIGH (ref 73–118)
Potassium: 3.8 mEq/L (ref 3.3–4.7)
Sodium: 139 mEq/L (ref 128–145)
Total Protein: 6.8 g/dL (ref 6.4–8.1)

## 2008-09-10 LAB — CBC WITH DIFFERENTIAL (CANCER CENTER ONLY)
BASO%: 0.4 % (ref 0.0–2.0)
LYMPH%: 13.2 % — ABNORMAL LOW (ref 14.0–48.0)
MCH: 29.7 pg (ref 26.0–34.0)
MCV: 88 fL (ref 81–101)
MONO%: 8.4 % (ref 0.0–13.0)
Platelets: 188 10*3/uL (ref 145–400)
RDW: 16.6 % — ABNORMAL HIGH (ref 10.5–14.6)

## 2008-10-16 LAB — CMP (CANCER CENTER ONLY)
ALT(SGPT): 18 U/L (ref 10–47)
AST: 27 U/L (ref 11–38)
Alkaline Phosphatase: 57 U/L (ref 26–84)
Calcium: 9.3 mg/dL (ref 8.0–10.3)
Chloride: 100 mEq/L (ref 98–108)
Creat: 0.9 mg/dl (ref 0.6–1.2)
Total Bilirubin: 0.8 mg/dl (ref 0.20–1.60)

## 2008-10-16 LAB — CBC WITH DIFFERENTIAL (CANCER CENTER ONLY)
BASO#: 0 10*3/uL (ref 0.0–0.2)
EOS%: 3.5 % (ref 0.0–7.0)
Eosinophils Absolute: 0.2 10*3/uL (ref 0.0–0.5)
HGB: 12 g/dL (ref 11.6–15.9)
LYMPH#: 0.7 10*3/uL — ABNORMAL LOW (ref 0.9–3.3)
MCHC: 34 g/dL (ref 32.0–36.0)
MONO#: 0.3 10*3/uL (ref 0.1–0.9)
NEUT#: 4.6 10*3/uL (ref 1.5–6.5)
RBC: 4.02 10*6/uL (ref 3.70–5.32)
WBC: 5.8 10*3/uL (ref 3.9–10.0)

## 2008-10-23 ENCOUNTER — Ambulatory Visit: Payer: Self-pay | Admitting: Oncology

## 2008-10-23 LAB — CMP (CANCER CENTER ONLY)
BUN, Bld: 15 mg/dL (ref 7–22)
CO2: 30 mEq/L (ref 18–33)
Creat: 1.1 mg/dl (ref 0.6–1.2)
Glucose, Bld: 161 mg/dL — ABNORMAL HIGH (ref 73–118)
Total Bilirubin: 0.5 mg/dl (ref 0.20–1.60)

## 2008-10-23 LAB — CBC WITH DIFFERENTIAL (CANCER CENTER ONLY)
BASO#: 0 10*3/uL (ref 0.0–0.2)
Eosinophils Absolute: 0.2 10*3/uL (ref 0.0–0.5)
HCT: 32.7 % — ABNORMAL LOW (ref 34.8–46.6)
HGB: 11.4 g/dL — ABNORMAL LOW (ref 11.6–15.9)
MCH: 30.3 pg (ref 26.0–34.0)
MONO%: 6 % (ref 0.0–13.0)
NEUT#: 5.1 10*3/uL (ref 1.5–6.5)
RBC: 3.75 10*6/uL (ref 3.70–5.32)

## 2008-10-28 LAB — CBC WITH DIFFERENTIAL (CANCER CENTER ONLY)
BASO%: 0.5 % (ref 0.0–2.0)
Eosinophils Absolute: 0.2 10*3/uL (ref 0.0–0.5)
HCT: 32.6 % — ABNORMAL LOW (ref 34.8–46.6)
LYMPH%: 14.2 % (ref 14.0–48.0)
MCH: 30.1 pg (ref 26.0–34.0)
MCV: 87 fL (ref 81–101)
MONO#: 0.5 10*3/uL (ref 0.1–0.9)
MONO%: 7.3 % (ref 0.0–13.0)
NEUT%: 74.5 % (ref 39.6–80.0)
Platelets: 246 10*3/uL (ref 145–400)
RDW: 13 % (ref 10.5–14.6)
WBC: 6.8 10*3/uL (ref 3.9–10.0)

## 2008-11-05 LAB — CBC WITH DIFFERENTIAL (CANCER CENTER ONLY)
BASO%: 0.5 % (ref 0.0–2.0)
EOS%: 3.8 % (ref 0.0–7.0)
HCT: 33.5 % — ABNORMAL LOW (ref 34.8–46.6)
LYMPH%: 12.5 % — ABNORMAL LOW (ref 14.0–48.0)
MCHC: 34.7 g/dL (ref 32.0–36.0)
MCV: 86 fL (ref 81–101)
NEUT%: 75.4 % (ref 39.6–80.0)
Platelets: 286 10*3/uL (ref 145–400)
RDW: 13.1 % (ref 10.5–14.6)
WBC: 7.7 10*3/uL (ref 3.9–10.0)

## 2008-11-05 LAB — CMP (CANCER CENTER ONLY)
ALT(SGPT): 22 U/L (ref 10–47)
AST: 25 U/L (ref 11–38)
Calcium: 10 mg/dL (ref 8.0–10.3)
Chloride: 104 mEq/L (ref 98–108)
Creat: 1.2 mg/dl (ref 0.6–1.2)
Potassium: 4.3 mEq/L (ref 3.3–4.7)

## 2008-11-21 ENCOUNTER — Ambulatory Visit: Payer: Self-pay | Admitting: Oncology

## 2008-11-25 LAB — CBC WITH DIFFERENTIAL (CANCER CENTER ONLY)
BASO%: 0.5 % (ref 0.0–2.0)
EOS%: 3.1 % (ref 0.0–7.0)
LYMPH#: 0.9 10*3/uL (ref 0.9–3.3)
MCHC: 34.7 g/dL (ref 32.0–36.0)
MONO#: 0.4 10*3/uL (ref 0.1–0.9)
NEUT#: 5.7 10*3/uL (ref 1.5–6.5)
Platelets: 258 10*3/uL (ref 145–400)
RDW: 13.1 % (ref 10.5–14.6)
WBC: 7.3 10*3/uL (ref 3.9–10.0)

## 2008-11-25 LAB — BASIC METABOLIC PANEL - CANCER CENTER ONLY
CO2: 29 mEq/L (ref 18–33)
Calcium: 9.9 mg/dL (ref 8.0–10.3)
Creat: 0.8 mg/dl (ref 0.6–1.2)

## 2008-12-03 ENCOUNTER — Ambulatory Visit (HOSPITAL_COMMUNITY): Admission: RE | Admit: 2008-12-03 | Discharge: 2008-12-03 | Payer: Self-pay | Admitting: Oncology

## 2008-12-10 LAB — CBC WITH DIFFERENTIAL (CANCER CENTER ONLY)
BASO#: 0 10*3/uL (ref 0.0–0.2)
Eosinophils Absolute: 0.3 10*3/uL (ref 0.0–0.5)
HGB: 11.9 g/dL (ref 11.6–15.9)
LYMPH#: 0.7 10*3/uL — ABNORMAL LOW (ref 0.9–3.3)
MCH: 28.2 pg (ref 26.0–34.0)
MONO#: 0.4 10*3/uL (ref 0.1–0.9)
MONO%: 6 % (ref 0.0–13.0)
NEUT#: 5.1 10*3/uL (ref 1.5–6.5)
Platelets: 248 10*3/uL (ref 145–400)
RBC: 4.22 10*6/uL (ref 3.70–5.32)
WBC: 6.5 10*3/uL (ref 3.9–10.0)

## 2008-12-10 LAB — BASIC METABOLIC PANEL - CANCER CENTER ONLY
BUN, Bld: 15 mg/dL (ref 7–22)
Calcium: 9.1 mg/dL (ref 8.0–10.3)
Creat: 0.8 mg/dl (ref 0.6–1.2)

## 2008-12-22 ENCOUNTER — Ambulatory Visit: Payer: Self-pay | Admitting: Oncology

## 2008-12-24 LAB — CMP (CANCER CENTER ONLY)
ALT(SGPT): 18 U/L (ref 10–47)
AST: 24 U/L (ref 11–38)
Albumin: 3 g/dL — ABNORMAL LOW (ref 3.3–5.5)
Alkaline Phosphatase: 58 U/L (ref 26–84)
BUN, Bld: 17 mg/dL (ref 7–22)
CO2: 31 mEq/L (ref 18–33)
Calcium: 9.2 mg/dL (ref 8.0–10.3)
Chloride: 101 mEq/L (ref 98–108)
Creat: 0.8 mg/dl (ref 0.6–1.2)
Glucose, Bld: 197 mg/dL — ABNORMAL HIGH (ref 73–118)
Potassium: 4.6 mEq/L (ref 3.3–4.7)
Sodium: 143 mEq/L (ref 128–145)
Total Bilirubin: 0.4 mg/dl (ref 0.20–1.60)
Total Protein: 7 g/dL (ref 6.4–8.1)

## 2008-12-24 LAB — CBC WITH DIFFERENTIAL (CANCER CENTER ONLY)
BASO#: 0 10*3/uL (ref 0.0–0.2)
Eosinophils Absolute: 0.4 10*3/uL (ref 0.0–0.5)
HCT: 34.1 % — ABNORMAL LOW (ref 34.8–46.6)
HGB: 11.7 g/dL (ref 11.6–15.9)
LYMPH%: 13.3 % — ABNORMAL LOW (ref 14.0–48.0)
MCH: 27.6 pg (ref 26.0–34.0)
MCV: 80 fL — ABNORMAL LOW (ref 81–101)
MONO#: 0.4 10*3/uL (ref 0.1–0.9)
MONO%: 5.7 % (ref 0.0–13.0)
NEUT%: 75.1 % (ref 39.6–80.0)
Platelets: 267 10*3/uL (ref 145–400)
RBC: 4.24 10*6/uL (ref 3.70–5.32)
WBC: 6.9 10*3/uL (ref 3.9–10.0)

## 2008-12-24 LAB — URINALYSIS, MICROSCOPIC (CHCC SATELLITE)
Ketones: NEGATIVE mg/dL
Nitrite: NEGATIVE
Protein: NEGATIVE mg/dL
Specific Gravity, Urine: 1.02 (ref 1.003–1.035)
pH: 5 (ref 4.60–8.00)

## 2008-12-28 LAB — URINE CULTURE

## 2008-12-30 ENCOUNTER — Ambulatory Visit: Payer: BLUE CROSS/BLUE SHIELD | Admitting: Oncology

## 2009-03-09 ENCOUNTER — Ambulatory Visit: Payer: Self-pay | Admitting: Oncology

## 2009-03-10 LAB — CBC WITH DIFFERENTIAL (CANCER CENTER ONLY)
BASO%: 0.4 % (ref 0.0–2.0)
HCT: 28.5 % — ABNORMAL LOW (ref 34.8–46.6)
LYMPH%: 13.4 % — ABNORMAL LOW (ref 14.0–48.0)
MCHC: 33.8 g/dL (ref 32.0–36.0)
MCV: 86 fL (ref 81–101)
MONO#: 0.4 10*3/uL (ref 0.1–0.9)
NEUT%: 76.7 % (ref 39.6–80.0)
RDW: 13.8 % (ref 10.5–14.6)

## 2009-03-10 LAB — BASIC METABOLIC PANEL - CANCER CENTER ONLY
CO2: 31 mEq/L (ref 18–33)
Calcium: 9.3 mg/dL (ref 8.0–10.3)
Chloride: 99 mEq/L (ref 98–108)
Creat: 0.9 mg/dl (ref 0.6–1.2)
Glucose, Bld: 137 mg/dL — ABNORMAL HIGH (ref 73–118)

## 2009-06-08 ENCOUNTER — Ambulatory Visit: Payer: Self-pay | Admitting: Oncology

## 2009-06-10 LAB — CBC WITH DIFFERENTIAL (CANCER CENTER ONLY)
BASO%: 0.3 % (ref 0.0–2.0)
EOS%: 5.2 % (ref 0.0–7.0)
HCT: 30.2 % — ABNORMAL LOW (ref 34.8–46.6)
LYMPH#: 1 10*3/uL (ref 0.9–3.3)
MCHC: 33.4 g/dL (ref 32.0–36.0)
NEUT%: 76.1 % (ref 39.6–80.0)
RDW: 12.6 % (ref 10.5–14.6)

## 2009-06-10 LAB — BASIC METABOLIC PANEL - CANCER CENTER ONLY
Calcium: 9.3 mg/dL (ref 8.0–10.3)
Chloride: 103 mEq/L (ref 98–108)
Creat: 0.9 mg/dl (ref 0.6–1.2)

## 2009-06-25 ENCOUNTER — Emergency Department: Payer: BLUE CROSS/BLUE SHIELD | Admitting: Emergency Medicine

## 2009-09-10 ENCOUNTER — Ambulatory Visit: Payer: Self-pay | Admitting: Oncology

## 2009-10-06 LAB — CBC WITH DIFFERENTIAL (CANCER CENTER ONLY)
BASO#: 0 10*3/uL (ref 0.0–0.2)
Eosinophils Absolute: 0.2 10*3/uL (ref 0.0–0.5)
HGB: 11.1 g/dL — ABNORMAL LOW (ref 11.6–15.9)
LYMPH#: 0.9 10*3/uL (ref 0.9–3.3)
MCH: 29.8 pg (ref 26.0–34.0)
MONO#: 0.4 10*3/uL (ref 0.1–0.9)
NEUT#: 4.8 10*3/uL (ref 1.5–6.5)
RBC: 3.72 10*6/uL (ref 3.70–5.32)

## 2009-10-06 LAB — CMP (CANCER CENTER ONLY)
Albumin: 3.7 g/dL (ref 3.3–5.5)
BUN, Bld: 15 mg/dL (ref 7–22)
CO2: 31 mEq/L (ref 18–33)
Calcium: 9.3 mg/dL (ref 8.0–10.3)
Chloride: 100 mEq/L (ref 98–108)
Glucose, Bld: 157 mg/dL — ABNORMAL HIGH (ref 73–118)
Potassium: 4.1 mEq/L (ref 3.3–4.7)

## 2009-11-02 ENCOUNTER — Ambulatory Visit: Payer: Self-pay | Admitting: Oncology

## 2009-12-15 ENCOUNTER — Ambulatory Visit: Payer: Self-pay | Admitting: Oncology

## 2010-01-19 ENCOUNTER — Ambulatory Visit: Payer: BLUE CROSS/BLUE SHIELD | Admitting: Ophthalmology

## 2010-01-25 ENCOUNTER — Ambulatory Visit: Payer: BLUE CROSS/BLUE SHIELD | Admitting: Ophthalmology

## 2010-01-26 ENCOUNTER — Ambulatory Visit: Payer: Self-pay | Admitting: Cardiovascular Disease

## 2010-02-04 ENCOUNTER — Ambulatory Visit: Payer: Self-pay | Admitting: Oncology

## 2010-04-01 ENCOUNTER — Ambulatory Visit: Payer: Self-pay | Admitting: Oncology

## 2010-06-20 ENCOUNTER — Encounter: Payer: Self-pay | Admitting: Thoracic Surgery

## 2010-08-11 IMAGING — PT NM PET TUM IMG RESTAG (PS) SKULL BASE T - THIGH
1 of 5 series · 1 of 25 positions shown · non-contrast
Comparison: 08/23/2007

CLINICAL DATA: Subsequent treatment strategy for lung carcinoma.

NUCLEAR MEDICINE PET CT RESTAGING (PS) SKULL BASE TO THIGH
TECHNIQUE: 19.3 mCi F-18 FDG was injected intravenously via the
right hand.  Full-ring PET imaging was performed from the skull
base through the mid-thighs 60  minutes after injection.  CT data
was obtained and used for attenuation correction and anatomic
localization only.  (This was not acquired as a diagnostic CT
examination.)
Fasting Blood Glucose:  78

[Series 1: pet ac · axial · 3.3mm · 4.69mm/px · 1 of 267 slices shown]
[im 153/267]
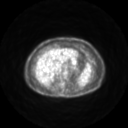

[1 of 25 positions shown; findings below may reference images not displayed]

FINDINGS: In the neck, there is increased hypermetabolic activity
seen in the in the left thyroid lobe, which now has a maximum SUV
11.1 compared with 3.4 on prior study.  This corresponds with a
small left-sided nodule in the left thyroid bed, which may
represent a small thyroid nodule or paratracheal lymph node.  No
other abnormal sites of hypermetabolic activity are identified in
the neck.

In the thorax, there is hypermetabolic adenopathy in the
mediastinum and right hilum.  This corresponds with increased in
size of adenopathy seen at these sites on corresponding noncontrast
CT images. The largest area of the right hilar region now has a SUV
max of 7.5 compared to 3.8 on prior exam.

No hypermetabolic soft tissue masses or adenopathy are identified
within the abdomen or pelvis.  Increased hypermetabolic activity is
seen throughout the bone marrow in the axial skeleton, which is
consistent with response to chemotherapy or granulocyte stimulating
factor.
IMPRESSION: 1. Increased hypermetabolic adenopathy in mediastinum and right
hilar region.
2.  Increased hypermetabolic activity within the tiny soft tissue
nodule in the left thyroid bed, which may represent a small thyroid
nodule or paratracheal lymph node.  Consider thyroid ultrasound for
further evaluation if clinically warranted.

REF:W2 DICTATED: 07/03/2008 [DATE]

## 2010-08-16 ENCOUNTER — Other Ambulatory Visit: Payer: Self-pay | Admitting: Oncology

## 2010-08-16 ENCOUNTER — Encounter (HOSPITAL_BASED_OUTPATIENT_CLINIC_OR_DEPARTMENT_OTHER): Payer: Medicare Other | Admitting: Oncology

## 2010-08-16 DIAGNOSIS — D649 Anemia, unspecified: Secondary | ICD-10-CM

## 2010-08-16 DIAGNOSIS — C3430 Malignant neoplasm of lower lobe, unspecified bronchus or lung: Secondary | ICD-10-CM

## 2010-08-16 DIAGNOSIS — N289 Disorder of kidney and ureter, unspecified: Secondary | ICD-10-CM

## 2010-08-16 LAB — CBC WITH DIFFERENTIAL/PLATELET
Basophils Absolute: 0 10*3/uL (ref 0.0–0.1)
EOS%: 1.6 % (ref 0.0–7.0)
Eosinophils Absolute: 0.1 10*3/uL (ref 0.0–0.5)
HGB: 10.4 g/dL — ABNORMAL LOW (ref 11.6–15.9)
NEUT#: 3.8 10*3/uL (ref 1.5–6.5)
RBC: 3.45 10*6/uL — ABNORMAL LOW (ref 3.70–5.45)
RDW: 13.2 % (ref 11.2–14.5)
lymph#: 1.1 10*3/uL (ref 0.9–3.3)

## 2010-08-16 LAB — COMPREHENSIVE METABOLIC PANEL
AST: 17 U/L (ref 0–37)
Albumin: 4.2 g/dL (ref 3.5–5.2)
BUN: 22 mg/dL (ref 6–23)
Calcium: 9.4 mg/dL (ref 8.4–10.5)
Chloride: 100 mEq/L (ref 96–112)
Potassium: 4.8 mEq/L (ref 3.5–5.3)
Sodium: 139 mEq/L (ref 135–145)
Total Protein: 6.7 g/dL (ref 6.0–8.3)

## 2010-09-14 LAB — APTT: aPTT: 27 seconds (ref 24–37)

## 2010-09-14 LAB — BASIC METABOLIC PANEL
BUN: 21 mg/dL (ref 6–23)
BUN: 35 mg/dL — ABNORMAL HIGH (ref 6–23)
CO2: 23 mEq/L (ref 19–32)
CO2: 33 mEq/L — ABNORMAL HIGH (ref 19–32)
Calcium: 8.9 mg/dL (ref 8.4–10.5)
Calcium: 9.4 mg/dL (ref 8.4–10.5)
Chloride: 93 mEq/L — ABNORMAL LOW (ref 96–112)
Creatinine, Ser: 1.03 mg/dL (ref 0.4–1.2)
Creatinine, Ser: 1.16 mg/dL (ref 0.4–1.2)
GFR calc Af Amer: 52 mL/min — ABNORMAL LOW (ref >60)
GFR calc Af Amer: >60 mL/min (ref >60)
GFR calc non Af Amer: 43 mL/min — ABNORMAL LOW (ref >60)
GFR calc non Af Amer: 45 mL/min — ABNORMAL LOW (ref >60)
Glucose, Bld: 105 mg/dL — ABNORMAL HIGH (ref 70–99)
Glucose, Bld: 253 mg/dL — ABNORMAL HIGH (ref 70–99)
Potassium: 4 mEq/L (ref 3.5–5.1)
Potassium: 4.1 mEq/L (ref 3.5–5.1)
Sodium: 135 mEq/L (ref 135–145)
Sodium: 139 mEq/L (ref 135–145)

## 2010-09-14 LAB — CBC
HCT: 35.8 % — ABNORMAL LOW (ref 36.0–46.0)
HCT: 37.3 % (ref 36.0–46.0)
Hemoglobin: 11.5 g/dL — ABNORMAL LOW (ref 12.0–15.0)
Hemoglobin: 12.4 g/dL (ref 12.0–15.0)
Hemoglobin: 12.8 g/dL (ref 12.0–15.0)
MCHC: 34.6 g/dL (ref 30.0–36.0)
MCV: 85.8 fL (ref 78.0–100.0)
Platelets: 236 10*3/uL (ref 150–400)
Platelets: 250 10*3/uL (ref 150–400)
RBC: 4.17 MIL/uL (ref 3.87–5.11)
RDW: 13.8 % (ref 11.5–15.5)
WBC: 9.8 10*3/uL (ref 4.0–10.5)
WBC: 9.8 10*3/uL (ref 4.0–10.5)

## 2010-09-14 LAB — TSH: TSH: 0.633 u[IU]/mL (ref 0.350–4.500)

## 2010-09-14 LAB — GLUCOSE, CAPILLARY
Glucose-Capillary: 105 mg/dL — ABNORMAL HIGH (ref 70–99)
Glucose-Capillary: 111 mg/dL — ABNORMAL HIGH (ref 70–99)
Glucose-Capillary: 130 mg/dL — ABNORMAL HIGH (ref 70–99)
Glucose-Capillary: 184 mg/dL — ABNORMAL HIGH (ref 70–99)
Glucose-Capillary: 85 mg/dL (ref 70–99)

## 2010-09-14 LAB — COMPREHENSIVE METABOLIC PANEL
ALT: 16 U/L (ref 0–35)
AST: 20 U/L (ref 0–37)
Albumin: 3.4 g/dL — ABNORMAL LOW (ref 3.5–5.2)
CO2: 27 mEq/L (ref 19–32)
Chloride: 102 mEq/L (ref 96–112)
Creatinine, Ser: 1.02 mg/dL (ref 0.4–1.2)
GFR calc Af Amer: >60 mL/min (ref >60)
GFR calc non Af Amer: 52 mL/min — ABNORMAL LOW (ref >60)
Sodium: 137 mEq/L (ref 135–145)
Total Bilirubin: 0.2 mg/dL — ABNORMAL LOW (ref 0.3–1.2)

## 2010-09-14 LAB — LIPID PANEL
HDL: 31 mg/dL — ABNORMAL LOW (ref >39)
Triglycerides: 174 mg/dL — ABNORMAL HIGH (ref <150)
VLDL: 35 mg/dL (ref 0–40)

## 2010-09-14 LAB — POCT CARDIAC MARKERS

## 2010-09-14 LAB — CARDIAC PANEL(CRET KIN+CKTOT+MB+TROPI)
CK, MB: 1.6 ng/mL (ref 0.3–4.0)
Total CK: 51 U/L (ref 7–177)
Total CK: 62 U/L (ref 7–177)
Troponin I: 0.01 ng/mL (ref 0.00–0.06)

## 2010-09-14 LAB — DIFFERENTIAL
Basophils Absolute: 0.5 10*3/uL — ABNORMAL HIGH (ref 0.0–0.1)
Basophils Relative: 3 % — ABNORMAL HIGH (ref 0–1)
Lymphocytes Relative: 5 % — ABNORMAL LOW (ref 12–46)
Neutro Abs: 13.2 10*3/uL — ABNORMAL HIGH (ref 1.7–7.7)
Neutrophils Relative %: 88 % — ABNORMAL HIGH (ref 43–77)

## 2010-09-14 LAB — BRAIN NATRIURETIC PEPTIDE: Pro B Natriuretic peptide (BNP): <30 pg/mL (ref 0.0–100.0)

## 2010-09-14 LAB — TROPONIN I: Troponin I: <0.01 ng/mL (ref 0.00–0.06)

## 2010-09-14 LAB — CK TOTAL AND CKMB (NOT AT ARMC): CK, MB: 1.5 ng/mL (ref 0.3–4.0)

## 2010-09-14 LAB — PROTIME-INR
INR: 1.1 (ref 0.00–1.49)
Prothrombin Time: 14 seconds (ref 11.6–15.2)

## 2010-10-12 NOTE — Letter (Signed)
August 07, 2007   Evelene Croon, M.D.  [address] [place].  [place], [place]  [address]   Re:  CHARLETT, MERKLE                 DOB:  [birth year]   Dear Dr. Lacie Scotts:   I saw Mr. Trentham back today after her bronchoscopy.  Fortunately, the 2  other times it was done, our biopsies were negative.  Since this  segmental right lower lobe lesion has gotten bigger.  I think we need to  consider a needle biopsy of this.  I have made arrangements to admit her  to the hospital to have a needle biopsy done in the next week or two.  I  will let you know our findings.   Her blood pressure was 195/85.  Pulse 8.  Respirations 22.  Saturations  were 96% on 2 L of oxygen.  Her temperature was 98.5.   I did give her a prescription for 5 Levaquin, as she has been coughing  up purulent sputum since her bronchoscopy.   Ines Bloomer, M.D.  Electronically Signed   DPB/MEDQ  D:  08/07/2007  T:  08/08/2007  Job:  [id number]

## 2010-10-12 NOTE — Letter (Signed)
July 04, 2007   [name]  [address]  [place], [place] [address]   Re:  [name], [name]                 DOB:  Nov 14, 1925   Dear Dr. Lacie Scotts:   I saw [name] back for followup today and repeated her CT scan.  Unfortunately this right hilar mass has still slowly increased in size.  This has increased gradually over almost 2 years.  She still has some  intermittent lower lobe bronchitis and there is some narrowing of the  bronchus intermedius in the right lower lobe and right middle lobe  bronchus.  I think it would be worth repeating her bronchoscopy, and I  plan to do this February 25, and try to do a transbronchial biopsy of  this to try to determine what we are dealing with.  I will let you know  what our findings are.   Her blood pressure was 141/66, pulse 60, respirations 18.  Saturations  were 98%.   Ines Bloomer, M.D.  Electronically Signed   DPB/MEDQ  D:  07/04/2007  T:  07/04/2007  Job:  [id number]   cc:   Cristy Hilts. Jacinto Halim, MD

## 2010-10-12 NOTE — Letter (Signed)
March 07, 2007   Dr. Evelene Croon  [address]  [place], [place] [address]   Re:  [name], [name]                 DOB:  1926-03-07   Dr. Lacie Scotts:   I saw [name] back in the office today and reviewed her CT scan.  It  has been 6 months since her last CT scan and the nodule on the right  lower lobe may be a little more prominent, but did not feel that it was  enlarged that much.  We will elect to just continue to follow her and we  will see her back again in 4 months with a CT scan.  She is having some  more problems as far as her pulmonary problems.  Her saturations were  98% on oxygen, but her blood pressure is 144/78, pulse 88 and  respirations 20.  Lungs were clear to auscultation and percussion.   Ines Bloomer, M.D.  Electronically Signed   DPB/MEDQ  D:  03/07/2007  T:  03/08/2007  Job:  [id number]

## 2010-10-12 NOTE — Consult Note (Signed)
[name], [name]                 ACCOUNT NO.:  [id number]   MEDICAL RECORD NO.:  [id number]          PATIENT TYPE:  INP   LOCATION:  2921                         FACILITY:  [facility]   PHYSICIAN:  Ines Bloomer, M.D. DATE OF BIRTH:  [birth year]   DATE OF CONSULTATION:  DATE OF DISCHARGE:                                 CONSULTATION   CHIEF COMPLAINT:  Chest pain.   HISTORY OF PRESENT ILLNESS:  This is an 77 year old patient who is well  known to me.  She has a slowly enlarging right perihilar mass, the  biopsy shows as adenocarcinoma.  It was very difficult to get the  diagnosis as it required multiple bronchoscopies and finally was  obtained on a needle biopsy.  Since then, she has been followed by Dr.  Welton Flakes.  She also has a history of coronary artery disease and chronic  obstructive pulmonary disease and is on oxygen.  She decided to have her  first chemo, and had her first chemo approximately 2 days ago and  immediately the next day developed severe chest pain after using  albuterol rather than Xopenex with radiation into her shoulders and  arms.  She came to the emergency room, had a CT scan that showed slight  increase in adenocarcinoma of the lung, and a small right pericardial  effusion.  A 2-D echo done today showed small effusion with no evidence  of tamponade.  There was some thickening of the pericardium.  She also  had some mild aortic stenosis.  She is still having some ongoing chest  pain, but has been hemodynamically stable.  She is intolerant to  multiple medications.  The surgery she has had are cholecystectomy,  tonsillectomy.  She had anemia, hypertension, dyslipidemia.  She had  atherosclerotic cardiovascular disease and a history of 3 CVAs.   MEDICATIONS:  norvase, benazepril, Plavix, Crestor, , aspirin,  levothyroxine, Protonix, torsemide, Xopenex.  She is also on Imdur,  nitroglycerin, insulin, Spiriva, hydrochlorothiazide, Crestor,  Aldactone, Glucotrol, and  ferrous sulfate.   FAMILY HISTORY:  Positive for coronary artery disease and cancer.   SOCIAL HISTORY:  Lives at home with her son.  She does not smoke, does  not drink alcohol.   REVIEW OF SYSTEMS:  CARDIAC:  See history of present illness.  PULMONARY:  See history of present illness.  GI:  No nausea, vomiting,  constipation, or diarrhea.  GU:  No kidney disease, dysuria, or frequent  urination.  VASCULAR:  No claudication.  There are previous CVAs.  No  DVT.  MUSCULOSKELETAL:  Severe arthritis.  HEMATOLOGICAL:  Anemia.  No  clotting disorders.  PSYCHIATRIC:  No psychiatric illness.  EYES/ENT:  No change in her eyesight or hearing.   PHYSICAL EXAMINATION:  VITAL SIGNS:  Blood pressure is 160/80, pulse  100, respirations 18, sats are 99% on 2 L.  HEAD, EYES, EARS, NOSE, AND THROAT:  Unremarkable.  NECK:  Supple without thyromegaly.  No supraclavicular or axillary  adenopathy.  CHEST:  Clear to auscultation and percussion.  HEART:  Regular sinus rhythm with a 2/6 systolic ejection murmur.  ABDOMEN:  Obese.  Bowel sounds are normal.  EXTREMITIES:  Pulses are 1+ edema.  No clubbing.  NEUROLOGIC:  She is oriented x3.  Sensory and motor intact.   IMPRESSION:  1. Non-small cell lung cancer.  2. Coronary artery disease.  3. New small pericardial effusion.  4. Hypertension.  5. Diabetes mellitus type 2.  6. Dyslipidemia.  7. Severe chronic obstructive pulmonary disease.   PLAN:  I do not feel that she needs any type of a pericardial window.  If anything, a paracentesis might be necessary for diagnosis, but there  is a good chance that this may be related either to pericarditis or  possibly given this occurred after chemotherapy some type of reaction to  chemotherapy and I am not sure whether she got Neulasta or not, which  does have cause a lot of muscular aches.  We suggested to have Dr. Welton Flakes  see her.  We will continue to follow her.      Ines Bloomer, M.D.   Electronically Signed     DPB/MEDQ  D:  07/24/2008  T:  07/25/2008  Job:  [id number]

## 2010-10-12 NOTE — Assessment & Plan Note (Signed)
[facility]                             PULMONARY OFFICE NOTE   NAME:[name], [name]                        MRN:          [id number]  DATE:10/11/2006                            DOB:          08/03/1925    HISTORY OF PRESENT ILLNESS:  [name] is a very pleasant 77 year old  female who I follow here abnormal chest x-ray with previously negative  bronchoscopy in April of 2007. She also has O2 dependency due to obesity  with obesity hypoventilation syndrome, and mild obstructive sleep apnea,  and she also has issues with chronic obstructive asthma. THE PATIENT IS  INTOLERANT OF LONG ACTING BETA AGONISTS and is maintained on Spiriva due  to this. She uses Singulair for rhinitis symptoms. Patient presents  today for followup also followup on her sleep study which was done at  [facility] read by Dr. Rexene Edison. Meredeth Ide.  This showed  that the patient had very mild obstructive sleep apnea, however, she did  have nocturnal desaturations which require her to continue on oxygen.  The patient did not meet split night criteria because of this. The  patient continues to have issues with daytime sleepiness and fatigue  also. She has a miriad of symptoms which appear to be related to  fibromyalgia and does feel increased issues with pain particularly upon  awaking.   As far as her dyspnea is concerned she is doing relatively well, voices  no complaint in this regard.   CURRENT MEDICATIONS:  Are as noted on the intake sheet.   REVIEW OF SYSTEMS:  Again is remarkable for arthralgias and myalgias  throughout. In the past she has been tried on Lyrica which she considers  extremely helpful for her pain but she was concerned about taking due to  her cardiac issues.   PHYSICAL EXAMINATION:  VITALS: Are as noted. Oxygen saturation is 98% on  2 liters nasal cannula O2.  IN GENERAL: This is an obese white female who is in no acute distress.  HEENT  EXAMINATION: Unremarkable for age.  NECK: Supple. No JVD noted.  LUNGS: Clear to auscultation bilaterally.  CARDIAC EXAMINATION: Regular rate and rhythm. No rubs, murmurs, or  gallops heard.  EXTREMITIES: Patient has no cyanosis, no clubbing, no edema noted.   IMPRESSION:  1. Mild obstructive sleep apnea with an RDI of 12. The patient however      is exceedingly symptomatic with this despite nocturnal oxygen      therapy.  2. Abnormal chest x-ray being followed currently by Dr. Edwyna Shell. The      patient has had chronic changes that have been stable by CT. As      noted previously she has had negative bronchoscopy in April of      2007.  3. Chronic pain syndrome, possibly secondary to fibromyalgia.   PLAN:  1. Because of the patient's symptoms of daytime somnolence and fatigue      on oxygen therapy and despite the fact that she has mild      obstructive apnea, I think that a therapeutic trial of CPAP  in this      situation is indicated, and therefore we will try her on Aut[employer]et      for 2 weeks with download . She will be tried with Opus Fisher      Paykel mask with oxygen bled in to see how she does with regards to      her symptoms.  2. The patient would like to try Lyrica again for her chronic pain.      She states that this helped her in the past. Since she is concerned      about potential side effects I recommended that she start at a very      small dose of 25 mg at bedtime, and discuss further dose adjustment      with her primary care physician Dr. Lacie Scotts.  3. Continue other medications as they are.  4. Follow up will be at this office on a p.r.n. basis. The patient      requests her follow up will maintained with Dr. Lacie Scotts with only      sub specialty consultation as needed. She is trying to consolidate      her visits to physicians.  5. Patient is to contact Dr. Lacie Scotts should any new problems arise      given the fact that I will be leaving the practice. The  patient and      her daughter understand this.     Gailen Shelter, MD  Electronically Signed    CLG/MedQ  DD: 10/11/2006  DT: 10/11/2006  Job #: [id number]   cc:   [name], MD  [place] Lacie Scotts

## 2010-10-12 NOTE — Discharge Summary (Signed)
[name], [name]                 ACCOUNT NO.:  [id number]   MEDICAL RECORD NO.:  [id number]          PATIENT TYPE:  INP   LOCATION:  3741                         FACILITY:  [facility]   PHYSICIAN:  Ritta Slot, MD     DATE OF BIRTH:  08/24/1925   DATE OF ADMISSION:  07/23/2008  DATE OF DISCHARGE:  07/27/2008                               DISCHARGE SUMMARY   DISCHARGE DIAGNOSES:  1. Chest pain, atypical, but does have history of coronary artery      disease.  She ruled out for an myocardial infarction.  2. Small pericardial effusion.  3. History of nonsmall cell lung carcinoma, stage IV, being treated      currently with radiation and chemotherapy.  4. Diabetes mellitus type 2.  5. Hypertension.  6. Hypothyroidism.  7. History of anemia.  8. History of cerebrovascular accident.  9. History of surgeries including partial thyroidectomy,      cholecystectomy, and tonsillectomy.   LABORATORY DATA:  Hemoglobin was on admission 11.5 and hematocrit 33.2;  at discharge hemoglobin 12.8 and hematocrit 37.3, WBCs 9.8, platelets  236.  ESR is 27.  Sodium 135, potassium 4.1, chloride 93, glucose 110,  BUN 35, creatinine 1.21, calcium was 9.4, albumin 3.4, and total protein  6.1.  Hemoglobin A1c 7.0.  CK-MBs and troponins were negative x3.  Her  BNP was 186 and then on the day of discharge, less than 30.  Total  cholesterol 126, triglycerides 174, HDL was 31, and LDL was 60.  TSH was  0.633.   CT of the chest showed no evidence of acute pulmonary embolism.  No  pericardial effusion, slight decreased in the right parahilar mass.   HOSPITAL COURSE:  [name] is a 77 year old female, patient of  Dr. Jacinto Halim, who had coronary artery disease.  She came to the [facility]  Emergency Room by EMS because of chest pain.  She has a history of  coronary artery disease with overlapping-type stent to circumflex and  Taxus stent to RCA proximal and cutting balloon angioplasty to PDA on  May 27, 2004.  Her last cath was in 2007.  Her stents were patent  at that time.  She does have some chronic angina, thought to be related  to peripheral vascular disease.  She apparently had had a nap and it was  just being watched.  Apparently more recently there had been some  changes, and she is now being treated for lung CA.  She had started  chemo the day before admission and also requests to start radiation on  the day of admission.  She noted that she had some wheezing and went  home to use an albuterol treatment recommended by her oncologist and  after that she has had chest pain.  Currently, she has had chest pain  before, was using albuterol.  She tried taking nitroglycerin.  Apparently, the pain kept coming back, thus she came to the emergency  room.  She ruled out for MI because of the pericardial effusion and her  lung CA, Dr. Edwyna Shell was consulted.  He felt that Dr. Welton Flakes should be  notified and consulted.  He was wondering if her chemotherapy agent  could be related to her chest discomfort.  Dr. Welton Flakes was out of town and  then another one of the oncologist came, they felt that her chest pain  was not related to her chemotherapy.  The patient was encouraged to  follow up with Dr. Welton Flakes at discharge.  No changes were made to her  medications.  She is apparently on single agent, carboplatin.  During  the hospital course, her chest pain symptoms improved.  She was taking  some Dilaudid.  On July 27, 2008, she was seen by Dr. Lynnea Ferrier, who  thought she was stable for discharge home.  To note, she did not get a  CT scan during the hospitalization and this ruled out her pulmonary  embolus.  Dr. Lynnea Ferrier recommended that she have a Myoview done as an  outpatient and then follow up with Dr. Jacinto Halim.  She also was treated with  Dilaudid for pain until she is able to go back to see her oncologist and  further pain medications should be ordered by them.   DISCHARGE MEDICATIONS:  She is to stop  her amlodipine and benazepril.  1. She will take plain amlodipine 5 mg a day.  2. Plavix 75 mg a day.  3. Crestor 10 mg a day.  4. Metoprolol succinate 25 mg a day.  5. Meclizine 25 mg a day.  6. Aspirin 81 mg a day.  7. Levothyroxine 100 mcg a day.  8. Protonix 40 mg a day.  9. __________ 10 mg MDI p.r.n.  10.Torsemide 20 mg a day.  11.Potassium chloride 10 mEq a day.  12.Xopenex 0.63 mg 3 mL as needed.  13.Iron 1 daily.  14.Imdur 60 mg every morning.  15.Imdur 30 mg every evening.  16.Atrovent daily.  17.Glipizide 5 mg daily.  18.Telmisartan 40/12.5 mg daily.  19.Nitroglycerin sublingual p.r.n.  20.__________ inhaler daily.  21.Spironolactone 25 mg daily.   Office will give her a call for an appointment for a stress test and  then she will follow up with Dr. Jacinto Halim.      Lezlie Octave, N.P.      Ritta Slot, MD  Electronically Signed    BB/MEDQ  D:  08/19/2008  T:  08/20/2008  Job:  [id number]   cc:   [name], MD  Ines Bloomer, M.D.  [place] Lacie Scotts

## 2010-10-12 NOTE — Letter (Signed)
August 21, 2007   [name], MD  [address] [place], [place] [address]   Re:  [name], [name]                 DOB:  [birth year]   Dear. Kalsoom:   I saw [name] back today. You have been following her for her  hematological problems. I have been following her for this right lower  lobe sphere segmental lesion, which she has had multiple biopsies, which  have been negative. I went ahead and since this has increased in size  since her last time, got a needle biopsy done of this and it turns out  this is adenocarcinoma.  Will repeat her PET scan and then refer her to  you. She is obviously not an operative candidate. She may be a candidate  for some radiation chemotherapy or possibly even Tarceva.   I will see her back again in 2 weeks.   Ines Bloomer, M.D.  Electronically Signed   DPB/MEDQ  D:  08/21/2007  T:  08/21/2007  Job:  [id number]   cc:   Evelene Croon

## 2010-10-12 NOTE — Letter (Signed)
September 04, 2007   [name], MD  [address] [place], [place] [address]   Re:  Debbie, [name]                   DOB:  28-Sep-1925   Dear Konrad Dolores:   I saw [name] back today and we discussed her options regarding her  adenocarcinoma.  She is definitely not an operative candidate given her  dependency, her chronic obstructive pulmonary disease, and her other  medical problems.  Apparently, she saw Dr. Roselind Messier who did not feel that  radiation would be beneficial although I do think she might be a  candidate for Cyberknife and I have suggested that she go to [facility]  for Cyberknife consultation.  Her only other option that we discussed  today would be doing nothing or doing chemotherapy and I encouraged to  consider chemotherapy, although I did tell her that I thought given the  growth rate of her cancer and since her PET scan only showed maybe a  small hilar node that she would have a year to 2 years before she would  have significant widespread disease.  We have been following this for  over 2-1/2 to 3 years with minimal change.  Of course, I did tell her  that I could not really predict the future.  I will be happy to see her  again if there I can be of any further assistance.   Ines Bloomer, M.D.  Electronically Signed   DPB/MEDQ  D:  09/04/2007  T:  09/04/2007  Job:  [id number]   cc:   Billie Lade, M.D.

## 2010-10-12 NOTE — Op Note (Signed)
[name], [name]                 ACCOUNT NO.:  [id number]   MEDICAL RECORD NO.:  [id number]          PATIENT TYPE:  AMB   LOCATION:  SDS                          FACILITY:  [facility]   PHYSICIAN:  Ines Bloomer, M.D. DATE OF BIRTH:  28-Aug-1925   DATE OF PROCEDURE:  08/03/2007  DATE OF DISCHARGE:                               OPERATIVE REPORT   PREOPERATIVE DIAGNOSIS:  Enlarging superior segmental mass.   POSTOPERATIVE DIAGNOSIS:  Enlarging superior segmental mass.   OPERATION PERFORMED:  Video bronchoscopy.   This patient has had two previous bronchoscopies trying to get a  diagnosis for a large superior segmental mass.  It was very slow growing  but appeared to progress with successive CT scans.  We brought her to  the operating room for her third bronchoscopy. After local anesthesia  with Cetacaine, Xylocaine, and IV sedation, the video bronchoscope was  passed through the mouth.  The carina was in the midline.  The right  main stem and right upper lobe orifices were normal.  The left main  stem, left lower lobe, and left upper lobe orifices were normal.  The  right middle lobe orifice was normal and the basilar segments of the  right lower lobe were normal.  There was some extrinsic narrowing of the  takeoff of the superior segment of the right upper lobe.  We put a brush  in there but could not get the brush to go very far because of the angle  but brushed that and also did biopsies of the takeoff of the right  superior segment and then using a Wang needle going posteriorly to try  and obtain cytology from the posterior superior segmental mass.  The  patient tolerated the procedure well and was returned to the recovery  room in stable condition.      Ines Bloomer, M.D.  Electronically Signed     DPB/MEDQ  D:  08/03/2007  T:  08/03/2007  Job:  [id number]

## 2010-10-15 NOTE — Cardiovascular Report (Signed)
[name], [name]                 ACCOUNT NO.:  [id number]   MEDICAL RECORD NO.:  [id number]          PATIENT TYPE:  INP   LOCATION:  2006                         FACILITY:  [facility]   PHYSICIAN:  Cristy Hilts. Jacinto Halim, MD       DATE OF BIRTH:  Mar 18, 1926   DATE OF PROCEDURE:  08/15/2005  DATE OF DISCHARGE:                              CARDIAC CATHETERIZATION   PROCEDURE PERFORMED:  1.  Left ventriculography.  2.  Selective right and left coronary angiography.  3.  Ascending aortogram.  4.  Abdominal aortogram.  5.  Right femoral angiography.   INDICATIONS FOR PROCEDURE:  [name] is a 77 year old fairly active  female with a history of known coronary artery disease with history of PTCA  and stenting of the circumflex coronary artery on May 27, 2004, with a  3 by 23 mm and 2.5 by 28 mm overlapping stents in the mid circumflex and a 3  by 32 mm Taxus stent implantation in the proximal RCA and a cutting balloon  angioplasty with a 2.25 by 10 mm into the PDA branch of the RCA done on December 23, 2003, who was admitted through [facility]  complaining of chest pain.  She had typical angina that she had prior to her  angioplasties.  She also had positive cardiac markers.  Given her ongoing  chest pain, she was brought to the cardiac catheterization lab for  definitive diagnosis of coronary artery disease.  The patient has been  compliant with her medications.   The ascending aortogram was performed to verify ascending aortic aneurysm  and aortic dissection and abdominal aortogram was performed for abdominal  atherosclerosis and to evaluate for renal artery stenosis.   HEMODYNAMIC DATA:  Left ventricular  pressure 129/1 with end diastolic  pressure of 75 mmHg.  Post LV-gram, the hemodynamics were 141/9 with end  diastolic pressure of 18 mmHg.  Central aortic pressure was 120/45 with a  mean of 89 mmHg.  There was no pressure gradient across the aortic valve.   ANGIOGRAPHIC DATA:  Left ventricle:  The left ventricular systolic function was normal with  ejection fraction estimated at 60-65%.  There was no significant mitral  regurgitation.  There was no wall motion abnormality.   Right coronary artery:  The right coronary artery is a large vessel and  dominant vessel.  It gives origin to a large PDA and large PLA.  The  previously placed stent in the proximal to mid RCA and the cutting balloon  angioplasty site at the PDA ostium are widely patent.  There is mild luminal  irregularity in the ostium.   Left main:  The left main is large caliber vessel.  It is normal.   Circumflex:  The circumflex is a large caliber vessel.  This gives origin to  a large obtuse marginal one, a small size obtuse marginal two, and continues  at [facility].  The previously placed stent in the mid circumflex is widely patent.  There is mild disease noted in the obtuse marginal one which was previously  noted and actually looks better.   Left anterior descending:  The left anterior descending  is a large caliber  vessel.  It gives origin to a tiny diagonal one and a moderate to large size  diagonal two.  It is mildly tortuous.  The proximal segment of the LAD has a  10-20% luminal irregularity.  The distal LAD  has 60-70% diffuse disease.  This is unchanged from prior cardiac catheterization, in fact, it looks even  better.   Ascending aortogram:  The ascending aortogram revealed the presence of three  aortic valve cusps.  There was no evidence of aortic dissection or ascending  aortic aneurysm or aortic regurgitation.   Abdominal aortogram:  The abdominal aortogram revealed the presence of two  renal arteries, one on either side that are widely patent.  The aortoiliac  bifurcation is widely patent.  There was no evidence of abdominal aortic  aneurysm.   Right femoral angiography:  Right femoral angiography revealed widely patent  right iliac and right femoral artery.   This was done to evaluate for patency  as the patient previously has had significant trauma to her right groin  during catheterization.   IMPRESSION:  Widely patent stents, no new disease, no loss of any major  branches compared to the catheterization done in 2005. I suspect small  vessel disease for unstable angina and non-Q wave myocardial infarction.  We  will also check a D-dimer, if they are highly elevated, may need CT scan of  her chest to evaluate for  pulmonary embolism as an etiology for elevated  cardiac enzymes and troponins.  Of significance, she has no significant  shortness of breath or syncope.   A total of 100 mL of contrast was utilized for diagnostic angiography.   TECHNIQUES OF PROCEDURE:  In the usual sterile technique, using a 6 French  right femoral artery access,  a 6 [place] multipurpose B2 catheter was  advanced through the ascending aorta over a 0.035 inch J-wire.  The catheter  was gently advanced to the left ventricle.  Left ventricular pressure was  monitored.  Hand contrast injection of the left ventricle was performed,  both in LAO and RAO projections.  The catheter was flushed with saline and  pulled back into the ascending aorta and pressure gradient across the aortic  valve was monitored.  The right coronary artery was selectively engaged and  angiography was performed.  Then, the left main coronary artery was  selectively engaged and angiography was performed.  Then, the catheter was  pulled into the root of the aorta and ascending aortogram in the LAO  projection was performed.  Then, the catheter was pulled back into the  abdominal aorta and abdominal aortogram was performed.  Right femoral  angiography was performed through the arterial access sheath.  The patient  tolerated the procedure well.  No immediate complications were noted.      Cristy Hilts. Jacinto Halim, MD  Electronically Signed     JRG/MEDQ  D:  08/15/2005  T:  08/16/2005  Job:  [id number]    cc:   Dr. Lacie Scotts  Fax [phone]

## 2010-10-15 NOTE — H&P (Signed)
NAMESUZANE, VANDERWEIDE                 ACCOUNT NO.:  [id number]   MEDICAL RECORD NO.:  [id number]          PATIENT TYPE:   LOCATION:                                 FACILITY:   PHYSICIAN:  Madlyn Frankel. Charlann Boxer, M.D.  DATE OF BIRTH:  Oct 01, 1925   DATE OF ADMISSION:  01/24/2006  DATE OF DISCHARGE:                                HISTORY & PHYSICAL   PRINCIPAL DIAGNOSIS:  Left hip pain with radiographic findings concerning  for an impending fracture in the femoral neck.   HISTORY OF PRESENT ILLNESS:  Ms. Keeler is a very pleasant 77 year old female  who was initially seen in consultation for left hip pain.  At the time of  the initial consultation, she had been diagnosed with a concern for  pulmonary nodule.  With the onset of hip pain, a CT scan was ordered which  revealed a lesion into the anterior lateral aspect or tension portion of the  femoral neck.  The concern with this was a metastatic lesion.  Further  workup and evaluation with Ms. Banta included a bronchial lavage which was  negative for any potential malignancy but was concerning for fungus.  Further workup of the hip included further imaging time as well as biopsy  which was negative for cancer or infection.  Nonetheless, Ms. Trovato has  continued to have pain in the left hip, worse with activities and quite  severe at times.  We have had discussions along the way about her overall  health and condition.  At times she is very certain that her pain is quite  severe and limiting her function and she wishes to have something done.  At  times she thinks that she is too old or too sick to have something done.  Nonetheless, we have provided ourselves and the service to help.  Initially,  I thought with the concern for metastatic lesion then surgery would clearly  have been helpful in identifying pathology with biopsy.  At this point with  the workup we have right now, the pure purpose of any surgical intervention  would be to obtain  information to rule out pathology but to help with pain.   PAST MEDICAL HISTORY:  1. COPD.  2. Asthma.  3. History of bronchitis.  4. She has had a history of stroke in 1979 and 1996, with some right sided      weakness that has improved with time.  5. She has a history of migraines.  6. History of hypertension.  7. History of MI.  8. Congestive heart failure.  9. Peripheral vascular disease.  10.Generalized coronary artery disease.  11.She has a history of reflux disease.  12.Gastritis.  13.Diverticulitis.  14.She has had a history of urinary tract infections.   PAST SURGICAL HISTORY:  1. Thyroidectomy, 1963.  2. Cholecystectomy.   FAMILY MEDICAL HISTORY:  Includes coronary artery disease, hypertension,  cancer, stroke.   DRUG ALLERGIES:  1. CODEINE.  2. TETRACYCLINE.  3. ULTRAM.  4. COMBIVENT.  5. LATEX ON HER SKIN.   PRIMARY CARE PHYSICIANS:  1. Valentino Hue. Magrinat, M.D.  2. Dr. Haywood Filler.   CURRENT MEDICATIONS:  At this point which will be listed and identified  through the medical reconciliation form include, amlodipine, benazepril,  aspirin, oxycodone, lorazepam, meclizine, levalbuterol, metoprolol,  pantoprazole, Protonix, Spiriva, Hyzaar, iron replacement, Singulair,  Crestor, Demadex, Plavix, K-Tabs, Synthroid, Imdur.   REVIEW OF SYSTEMS:  Otherwise unremarkable other than that noted in the HPI.   Please note that a recent CT scan had been ordered of her hip to evaluate  for any pathologic fracture given her persistent pain, none was identified.  The lesion in the proximal femoral neck is still present.  It is unchanged.   PHYSICAL EXAMINATION:  GENERAL:  In the office today Ms. Barnhard is awake,  alert, and oriented, and very cooperative.  She presents with her daughter.  She does use oxygen in transport.  VITAL SIGNS:  Her temperature is 98.4, pulse 68, blood pressure 160/82.  CHEST:  Clear to auscultation bilaterally.  HEART:  Regular rate and rhythm.   MUSCULOSKELETAL:  Examination of her hip revealed that she tolerates gentle  hip range of motion but flexion, internal rotation produce pain.  She has  more pain with weightbearing and certain activities.  She has palpable  pulses, intact sensibility.  She has no pain with right hip range of motion.   Review of all her radiographs was carried out including the most recent CT  scan report.   ASSESSMENT:  Left hip pain with a presumptive diagnosis of a left proximal  femoral lesion resulting in a near impending or stress type reaction.   The differential diagnoses would include metastatic disease versus infection  versus a stress reaction within a resulting cyst.   PLAN:  At this point, we are going to continue to watch Ms. Wesolowski.  She  wished to have an injection prior to her surgical intervention and this will  be scheduled as possible.  The surgery may be cancelled prior to that date.  If she has persistent recurrent problems without relief from the injection,  she may wish to consider surgical intervention and that is the reason for  this visit today.  Questions were encouraged, answered and reviewed.      Madlyn Frankel Charlann Boxer, M.D.  Electronically Signed     MDO/MEDQ  D:  01/10/2006  T:  01/10/2006  Job:  [id number]

## 2011-02-17 ENCOUNTER — Ambulatory Visit: Payer: BLUE CROSS/BLUE SHIELD | Admitting: Cardiovascular Disease

## 2011-02-21 LAB — COMPREHENSIVE METABOLIC PANEL
Albumin: 4
BUN: 16
Calcium: 9.8
Creatinine, Ser: 0.86
Total Protein: 7

## 2011-02-21 LAB — AFB CULTURE WITH SMEAR (NOT AT ARMC): Acid Fast Smear: NONE SEEN

## 2011-02-21 LAB — CBC
HCT: 38.2
HCT: 37.2
MCHC: 33.8
MCHC: 34.2
MCV: 83.6
MCV: 83
Platelets: 292
Platelets: 255
RBC: 4.49
RDW: 14.6
WBC: 10.3

## 2011-02-21 LAB — APTT: aPTT: 28

## 2011-02-21 LAB — PROTIME-INR: INR: 1

## 2011-02-21 LAB — FUNGUS CULTURE W SMEAR

## 2011-02-21 LAB — BASIC METABOLIC PANEL
BUN: 15
CO2: 29
Chloride: 102
Potassium: 3.4 — ABNORMAL LOW

## 2011-02-21 LAB — URINALYSIS, ROUTINE W REFLEX MICROSCOPIC
Glucose, UA: NEGATIVE
pH: 6

## 2011-02-21 LAB — CULTURE, RESPIRATORY W GRAM STAIN

## 2011-02-21 LAB — URINE MICROSCOPIC-ADD ON

## 2011-11-08 ENCOUNTER — Emergency Department: Payer: Self-pay | Admitting: Emergency Medicine

## 2011-11-08 LAB — CBC
HCT: 31.8 % — ABNORMAL LOW (ref 35.0–47.0)
MCHC: 33.3 g/dL (ref 32.0–36.0)
MCV: 89 fL (ref 80–100)
Platelet: 250 10*3/uL (ref 150–440)
RBC: 3.59 10*6/uL — ABNORMAL LOW (ref 3.80–5.20)
WBC: 4.6 10*3/uL (ref 3.6–11.0)

## 2011-11-08 LAB — COMPREHENSIVE METABOLIC PANEL
Alkaline Phosphatase: 54 U/L (ref 50–136)
Anion Gap: 8 (ref 7–16)
BUN: 17 mg/dL (ref 7–18)
Chloride: 105 mmol/L (ref 98–107)
Co2: 26 mmol/L (ref 21–32)
EGFR (Non-African Amer.): 59 — ABNORMAL LOW
Potassium: 4 mmol/L (ref 3.5–5.1)
SGOT(AST): 25 U/L (ref 15–37)
Sodium: 139 mmol/L (ref 136–145)

## 2011-11-08 LAB — PROTIME-INR: Prothrombin Time: 12.8 secs (ref 11.5–14.7)

## 2011-11-08 LAB — URINALYSIS, COMPLETE
Ketone: NEGATIVE
Specific Gravity: 1.004 (ref 1.003–1.030)
WBC UR: 1 /HPF (ref 0–5)

## 2012-05-18 ENCOUNTER — Encounter: Payer: Self-pay | Admitting: Cardiovascular Disease

## 2012-10-29 ENCOUNTER — Inpatient Hospital Stay: Payer: Self-pay | Admitting: Internal Medicine

## 2012-10-29 LAB — CBC WITH DIFFERENTIAL/PLATELET
Basophil %: 0.8 %
Eosinophil %: 1.2 %
Lymphocyte %: 19 %
MCH: 30.6 pg (ref 26.0–34.0)
Monocyte #: 0.3 x10 3/mm (ref 0.2–0.9)
Monocyte %: 7.9 %
Neutrophil %: 71.1 %
Platelet: 282 10*3/uL (ref 150–440)
RBC: 3.64 10*6/uL — ABNORMAL LOW (ref 3.80–5.20)

## 2012-10-29 LAB — COMPREHENSIVE METABOLIC PANEL
Alkaline Phosphatase: 38 U/L — ABNORMAL LOW (ref 50–136)
Anion Gap: 8 (ref 7–16)
BUN: 26 mg/dL — ABNORMAL HIGH (ref 7–18)
Bilirubin,Total: 0.3 mg/dL (ref 0.2–1.0)
Calcium, Total: 9.6 mg/dL (ref 8.5–10.1)
Chloride: 104 mmol/L (ref 98–107)
Creatinine: 1.07 mg/dL (ref 0.60–1.30)
EGFR (African American): 55 — ABNORMAL LOW
Glucose: 121 mg/dL — ABNORMAL HIGH (ref 65–99)
Potassium: 4.7 mmol/L (ref 3.5–5.1)
SGPT (ALT): 21 U/L (ref 12–78)

## 2012-10-29 LAB — RAPID INFLUENZA A&B ANTIGENS

## 2012-10-29 LAB — URINALYSIS, COMPLETE
Bilirubin,UR: NEGATIVE
Protein: NEGATIVE
Squamous Epithelial: 1

## 2012-10-30 LAB — CBC WITH DIFFERENTIAL/PLATELET
Basophil %: 0.6 %
Eosinophil %: 1.3 %
MCH: 30.9 pg (ref 26.0–34.0)
MCV: 91 fL (ref 80–100)
Monocyte #: 0.4 x10 3/mm (ref 0.2–0.9)
Monocyte %: 10.8 %
Neutrophil %: 64.1 %
Platelet: 267 10*3/uL (ref 150–440)
RDW: 13.4 % (ref 11.5–14.5)

## 2012-11-04 LAB — CULTURE, BLOOD (SINGLE)

## 2013-01-29 ENCOUNTER — Ambulatory Visit: Payer: Self-pay | Admitting: Internal Medicine

## 2013-03-11 ENCOUNTER — Ambulatory Visit: Payer: Self-pay | Admitting: Oncology

## 2013-03-11 LAB — FERRITIN: Ferritin (ARMC): 174 ng/mL (ref 8–388)

## 2013-03-11 LAB — IRON AND TIBC
Iron: 91 ug/dL (ref 50–170)
Unbound Iron-Bind.Cap.: 322 ug/dL

## 2013-03-11 LAB — CBC CANCER CENTER
Basophil #: 0 x10 3/mm (ref 0.0–0.1)
Basophil %: 0.4 %
Eosinophil #: 0.1 x10 3/mm (ref 0.0–0.7)
HCT: 32.7 % — ABNORMAL LOW (ref 35.0–47.0)
HGB: 11.2 g/dL — ABNORMAL LOW (ref 12.0–16.0)
Lymphocyte %: 15.9 %
MCH: 31.1 pg (ref 26.0–34.0)
MCHC: 34.3 g/dL (ref 32.0–36.0)
Monocyte #: 0.4 x10 3/mm (ref 0.2–0.9)
Monocyte %: 7 %
Platelet: 268 x10 3/mm (ref 150–440)
WBC: 5.8 x10 3/mm (ref 3.6–11.0)

## 2013-03-11 LAB — FOLATE: Folic Acid: 65.9 ng/mL (ref 3.1–100.0)

## 2013-03-30 ENCOUNTER — Ambulatory Visit: Payer: Self-pay | Admitting: Oncology

## 2013-04-29 ENCOUNTER — Ambulatory Visit: Payer: Self-pay | Admitting: Oncology

## 2013-05-06 ENCOUNTER — Ambulatory Visit: Payer: Self-pay | Admitting: Oncology

## 2013-05-30 ENCOUNTER — Ambulatory Visit: Payer: Self-pay | Admitting: Oncology

## 2013-06-04 LAB — CANCER CENTER HEMOGLOBIN: HGB: 10 g/dL — ABNORMAL LOW (ref 12.0–16.0)

## 2013-06-30 ENCOUNTER — Ambulatory Visit: Payer: Self-pay | Admitting: Oncology

## 2013-07-02 LAB — CBC CANCER CENTER
Basophil #: 0 x10 3/mm (ref 0.0–0.1)
Basophil %: 0.7 %
Eosinophil #: 0.1 x10 3/mm (ref 0.0–0.7)
Eosinophil %: 1.6 %
HCT: 31.8 % — AB (ref 35.0–47.0)
HGB: 10.7 g/dL — AB (ref 12.0–16.0)
LYMPHS ABS: 0.9 x10 3/mm — AB (ref 1.0–3.6)
Lymphocyte %: 17.8 %
MCH: 30.1 pg (ref 26.0–34.0)
MCHC: 33.7 g/dL (ref 32.0–36.0)
MCV: 89 fL (ref 80–100)
MONOS PCT: 8.6 %
Monocyte #: 0.4 x10 3/mm (ref 0.2–0.9)
Neutrophil #: 3.6 x10 3/mm (ref 1.4–6.5)
Neutrophil %: 71.3 %
PLATELETS: 313 x10 3/mm (ref 150–440)
RBC: 3.56 10*6/uL — ABNORMAL LOW (ref 3.80–5.20)
RDW: 13.5 % (ref 11.5–14.5)
WBC: 5.1 x10 3/mm (ref 3.6–11.0)

## 2013-07-28 ENCOUNTER — Ambulatory Visit: Payer: Self-pay | Admitting: Oncology

## 2013-10-11 ENCOUNTER — Ambulatory Visit: Payer: Self-pay | Admitting: Oncology

## 2013-10-24 LAB — BASIC METABOLIC PANEL
ANION GAP: 9 (ref 7–16)
BUN: 22 mg/dL — ABNORMAL HIGH (ref 7–18)
Calcium, Total: 8.9 mg/dL (ref 8.5–10.1)
Chloride: 100 mmol/L (ref 98–107)
Co2: 30 mmol/L (ref 21–32)
Creatinine: 1.21 mg/dL (ref 0.60–1.30)
EGFR (African American): 47 — ABNORMAL LOW
GFR CALC NON AF AMER: 41 — AB
Glucose: 194 mg/dL — ABNORMAL HIGH (ref 65–99)
Osmolality: 286 (ref 275–301)
Potassium: 4.5 mmol/L (ref 3.5–5.1)
Sodium: 139 mmol/L (ref 136–145)

## 2013-10-24 LAB — CBC CANCER CENTER
BASOS ABS: 0 x10 3/mm (ref 0.0–0.1)
BASOS PCT: 0.6 %
EOS ABS: 0.1 x10 3/mm (ref 0.0–0.7)
Eosinophil %: 1.1 %
HCT: 28.9 % — AB (ref 35.0–47.0)
HGB: 9.9 g/dL — ABNORMAL LOW (ref 12.0–16.0)
LYMPHS PCT: 15.6 %
Lymphocyte #: 0.9 x10 3/mm — ABNORMAL LOW (ref 1.0–3.6)
MCH: 31 pg (ref 26.0–34.0)
MCHC: 34.2 g/dL (ref 32.0–36.0)
MCV: 91 fL (ref 80–100)
MONO ABS: 0.5 x10 3/mm (ref 0.2–0.9)
Monocyte %: 7.6 %
NEUTROS PCT: 75.1 %
Neutrophil #: 4.5 x10 3/mm (ref 1.4–6.5)
Platelet: 292 x10 3/mm (ref 150–440)
RBC: 3.19 10*6/uL — ABNORMAL LOW (ref 3.80–5.20)
RDW: 12.6 % (ref 11.5–14.5)
WBC: 6 x10 3/mm (ref 3.6–11.0)

## 2013-10-24 LAB — IRON AND TIBC
Iron Bind.Cap.(Total): 417 ug/dL (ref 250–450)
Iron Saturation: 16 %
Iron: 66 ug/dL (ref 50–170)
UNBOUND IRON-BIND. CAP.: 351 ug/dL

## 2013-10-24 LAB — FERRITIN: FERRITIN (ARMC): 205 ng/mL (ref 8–388)

## 2013-10-28 ENCOUNTER — Ambulatory Visit: Payer: Self-pay | Admitting: Oncology

## 2013-12-10 ENCOUNTER — Observation Stay: Payer: Self-pay | Admitting: Internal Medicine

## 2013-12-10 LAB — URINALYSIS, COMPLETE
BILIRUBIN, UR: NEGATIVE
BLOOD: NEGATIVE
Bacteria: NONE SEEN
Glucose,UR: NEGATIVE mg/dL (ref 0–75)
KETONE: NEGATIVE
Nitrite: NEGATIVE
PROTEIN: NEGATIVE
Ph: 5 (ref 4.5–8.0)
RBC,UR: <1 /HPF (ref 0–5)
SPECIFIC GRAVITY: 1.01 (ref 1.003–1.030)
Squamous Epithelial: 1

## 2013-12-10 LAB — CBC
HCT: 29.1 % — AB (ref 35.0–47.0)
HGB: 9.6 g/dL — ABNORMAL LOW (ref 12.0–16.0)
MCH: 30.4 pg (ref 26.0–34.0)
MCHC: 33.1 g/dL (ref 32.0–36.0)
MCV: 92 fL (ref 80–100)
PLATELETS: 253 10*3/uL (ref 150–440)
RBC: 3.17 10*6/uL — ABNORMAL LOW (ref 3.80–5.20)
RDW: 13.1 % (ref 11.5–14.5)
WBC: 4.6 10*3/uL (ref 3.6–11.0)

## 2013-12-10 LAB — COMPREHENSIVE METABOLIC PANEL
ALBUMIN: 3.5 g/dL (ref 3.4–5.0)
ALT: 27 U/L (ref 12–78)
ANION GAP: 7 (ref 7–16)
AST: 31 U/L (ref 15–37)
Alkaline Phosphatase: 34 U/L — ABNORMAL LOW
BUN: 29 mg/dL — AB (ref 7–18)
Bilirubin,Total: 0.2 mg/dL (ref 0.2–1.0)
CHLORIDE: 107 mmol/L (ref 98–107)
CO2: 25 mmol/L (ref 21–32)
Calcium, Total: 8.8 mg/dL (ref 8.5–10.1)
Creatinine: 1.38 mg/dL — ABNORMAL HIGH (ref 0.60–1.30)
EGFR (African American): 40 — ABNORMAL LOW
GFR CALC NON AF AMER: 35 — AB
GLUCOSE: 99 mg/dL (ref 65–99)
Osmolality: 283 (ref 275–301)
POTASSIUM: 5 mmol/L (ref 3.5–5.1)
Sodium: 139 mmol/L (ref 136–145)
Total Protein: 7.2 g/dL (ref 6.4–8.2)

## 2013-12-10 LAB — TROPONIN I

## 2013-12-10 LAB — PROTIME-INR
INR: 1
PROTHROMBIN TIME: 13.2 s (ref 11.5–14.7)

## 2013-12-11 ENCOUNTER — Telehealth: Payer: Self-pay

## 2013-12-11 LAB — CBC WITH DIFFERENTIAL/PLATELET
BASOS ABS: 0 10*3/uL (ref 0.0–0.1)
Basophil %: 0.8 %
Eosinophil #: 0.1 10*3/uL (ref 0.0–0.7)
Eosinophil %: 1.2 %
HCT: 29.1 % — ABNORMAL LOW (ref 35.0–47.0)
HGB: 9.6 g/dL — AB (ref 12.0–16.0)
LYMPHS ABS: 1.2 10*3/uL (ref 1.0–3.6)
Lymphocyte %: 27.2 %
MCH: 30.1 pg (ref 26.0–34.0)
MCHC: 33 g/dL (ref 32.0–36.0)
MCV: 91 fL (ref 80–100)
MONO ABS: 0.5 x10 3/mm (ref 0.2–0.9)
Monocyte %: 11.4 %
Neutrophil #: 2.6 10*3/uL (ref 1.4–6.5)
Neutrophil %: 59.4 %
Platelet: 256 10*3/uL (ref 150–440)
RBC: 3.19 10*6/uL — ABNORMAL LOW (ref 3.80–5.20)
RDW: 13.3 % (ref 11.5–14.5)
WBC: 4.3 10*3/uL (ref 3.6–11.0)

## 2013-12-11 LAB — BASIC METABOLIC PANEL
ANION GAP: 6 — AB (ref 7–16)
BUN: 26 mg/dL — ABNORMAL HIGH (ref 7–18)
CALCIUM: 9 mg/dL (ref 8.5–10.1)
CREATININE: 1.32 mg/dL — AB (ref 0.60–1.30)
Chloride: 107 mmol/L (ref 98–107)
Co2: 25 mmol/L (ref 21–32)
EGFR (African American): 42 — ABNORMAL LOW
EGFR (Non-African Amer.): 36 — ABNORMAL LOW
Glucose: 98 mg/dL (ref 65–99)
Osmolality: 280 (ref 275–301)
POTASSIUM: 4.9 mmol/L (ref 3.5–5.1)
Sodium: 138 mmol/L (ref 136–145)

## 2013-12-11 LAB — LIPID PANEL
Cholesterol: 168 mg/dL (ref 0–200)
HDL: 35 mg/dL — AB (ref 40–60)
LDL CHOLESTEROL, CALC: 75 mg/dL (ref 0–100)
Triglycerides: 291 mg/dL — ABNORMAL HIGH (ref 0–200)
VLDL Cholesterol, Calc: 58 mg/dL — ABNORMAL HIGH (ref 5–40)

## 2013-12-11 LAB — HEMOGLOBIN A1C: HEMOGLOBIN A1C: 7.2 % — AB (ref 4.2–6.3)

## 2013-12-11 NOTE — Telephone Encounter (Signed)
Received by fax dtd 12/11/13 from [place] - Echocardiogram.  Original to scan.  Copy to [place].

## 2014-03-11 ENCOUNTER — Other Ambulatory Visit: Payer: Self-pay | Admitting: Orthopedic Surgery

## 2014-03-11 DIAGNOSIS — G8929 Other chronic pain: Secondary | ICD-10-CM

## 2014-03-11 DIAGNOSIS — M545 Low back pain: Principal | ICD-10-CM

## 2014-07-31 ENCOUNTER — Ambulatory Visit
Admit: 2014-07-31 | Disposition: A | Discharge status: Home / Self Care | Payer: Self-pay | Attending: Oncology | Admitting: Oncology

## 2014-08-07 ENCOUNTER — Ambulatory Visit: Payer: Self-pay | Admitting: Ophthalmology

## 2014-08-18 ENCOUNTER — Ambulatory Visit: Payer: Self-pay | Admitting: Ophthalmology

## 2014-08-29 ENCOUNTER — Ambulatory Visit
Admit: 2014-08-29 | Disposition: A | Discharge status: Home / Self Care | Payer: Self-pay | Attending: Oncology | Admitting: Oncology

## 2014-09-19 NOTE — H&P (Signed)
PATIENT NAMEWALAA, [name] MR#:  [id number] DATE OF BIRTH:  10-Sep-1925  DATE OF ADMISSION:  10/29/2012  ADDENDUM   HOME MEDICATIONS:  1.  Xopenex 1 puff 4 times a day. 2.  Trilipix 135 mg oral once a day. 3.  Torsemide 20 mg tablet once a day as needed. 4.  Spironolactone 25 mg once a day. 5.  Spiriva 18 mcg inhalation capsule once a day daily. 6.  Montelukast 10 mg oral tablet once a day. 7.  Metoprolol 25 mg 2 times a day. 8.  Meclizine 25 mg oral tablet 2 times a day. 9.  Levothyroxine 100 mcg once a day. 10.  Januvia 100 mg oral tablet once a day.  11.  Integra  once a day.  12.  Imdur 60 mg 2 times a day.  13.  Glipizide 10 mg once a day. 14.  Gabapentin 100 mg oral tablet 2 times a day.  15.  Crestor 10 mg once a day.  16.  Aggrenox 25 mg/200 mg take 1 capsule 2 times a day.     ____________________________ Ceasar Lund Anselm Jungling, MD vgv:jm D: 10/29/2012 17:14:08 ET T: 10/29/2012 19:03:43 ET JOB#: [id number]  cc: Ceasar Lund. Anselm Jungling, MD, <Dictator> Vaughan Basta MD ELECTRONICALLY SIGNED 10/30/2012 20:11

## 2014-09-19 NOTE — H&P (Signed)
PATIENT NAMELAASYA, [name] MR#:  [id number] DATE OF BIRTH:  [birth year]  DATE OF ADMISSION:  10/29/2012  PRIMARY CARE PHYSICIAN: Lorelee Market, MD  ER REFERRING PHYSICIAN: Orlie Dakin, MD  CHIEF COMPLAINT: Shortness of breath and weakness.   HISTORY OF PRESENT ILLNESS: The patient is an 81 year old female with history of COPD, diabetes, hypothyroidism, chronic oxygen use at home who is living a very independent and active life, walking with a walker.  For the last 2 to 3 days, she has started feeling weak and tired with some shortness of breath. She has been using oxygen chronically at home up to 3 liters via nasal cannula, but with the same oxygen use she has been feeling short of breath for the last 2 to 3 days. She tried her regular nebulizer treatment at home but was not helping enough and so decided to come to the Emergency Room. She has a history of lung cancer which was treated by chemotherapy and then radiation 3 years ago, and after that she had follow-up scans which were cancer free almost every year.  Now for the last few months, she started following again with oncologist and hematologist for her low cell count and anemia, and the plan was to do a CAT scan to see the cancer relapse in the near future. On work-up over here on CAT scan, she was found having some questionable infiltrate on the perihilar region, and so she is being admitted with a diagnosis of pneumonia for further management.   REVIEW OF SYSTEMS:  CONSTITUTIONAL: Negative for fever, fatigue, but positive for weakness. No pain or weight loss.  EYES: No blurring or double vision or discharge.  EARS, NOSE, THROAT: No tinnitus, ear pain or hearing loss.  RESPIRATORY: Has no cough or wheezing but feeling mildly short of breath.  CARDIOVASCULAR: No chest pain, orthopnea, edema or palpitations.  GASTROINTESTINAL: Has been feeling nauseous but no vomiting, diarrhea, abdominal pain.  GENITOURINARY: No dysuria, hematuria or  increased frequency. No polyuria, heat or cold intolerance.  SKIN: No acne or rashes.  MUSCULOSKELETAL: No pain, no swelling in the joints.  NEUROLOGICAL: No numbness, weakness, dysarthria or tremors.  PSYCHIATRIC: No anxiety, insomnia.  PAST MEDICAL HISTORY: COPD, has been using oxygen chronically 3 liters, diabetes, hypertension, hyperlipidemia, coronary artery disease status post stent x 3.   PAST SURGICAL HISTORY: None.   ALLERGIES: MULTIPLE ALLERGIES REVIEWED, ALBUTEROL, DARVON, FENTANYL, TETRACYCLINE, LATEX.    SOCIAL HISTORY: Lives with her son, walks with walker and almost independent in her day-to-day activities. She was never a smoker but was exposed to secondhand smoking, has been using supplemental oxygen for the last few years. Denies drinking alcohol or illegal drug use.   FAMILY HISTORY: Positive for diabetes and coronary artery disease in multiple family members.   PHYSICAL EXAMINATION: VITAL SIGNS: In ER, temperature 98, pulse rate 75, respirations 20, and blood pressure 131/67 and pulse ox 98 on 4 liters nasal cannula supplementation,   HEENT: Head and neck atraumatic. Conjunctivae pink. Oral mucosa moist.  NECK: Supple.  CARDIOVASCULAR: S1, S2 present, regular. No murmur.  RESPIRATORY: Bilateral clear and equal air entry.  ABDOMEN: Soft, nontender. Bowel sounds present. No organomegaly.  SKIN: No rashes.  MUSCULOSKELETAL:  Legs: No edema. Joints: No swelling or tenderness.  NEUROLOGICAL: Power 5 out of 5, moves all 4 limbs. No tremors.  PSYCHIATRIC: Does not appear in any acute psychiatric illness.   LABORATORY AND RADIOLOGICAL DATA: Glucose 121. BNP 443.  BUN 26, creatinine 1.07,  sodium 138, potassium 4.7, total protein 7.5, albumin 3.9, bilirubin 0.3, SGOT 26, SGPT 21. Troponin less than 0.02. WBC 4.1, hemoglobin 11.1, platelet count 282, MCV 90.  Urinalysis is grossly negative with 1 WBC and 1+ leukocyte esterase.   X-ray of the chest, portable, showed spiculated  density in the right hilar region, absence of clinical signs and symptoms of infection.  This finding is concerning for a mass.  The patient has a reported history of prior lung neoplasm, and recurrence of the diagnosis is a consideration.  CT of the chest with contrast was done due to the above mentioned finding on x-ray and reported as no thoracic aortic aneurysm or thoracic artery dissection. No pulmonary embolism.  Perihilar infiltrate likely present in right.  Malignancy is felt to be less likely but cannot be excluded.  Subpleural nodular density in  lower lobe.  Following in 3 to 6 months is recommended    ASSESSMENT/PLAN:  An 81 year old female with a history of chronic obstructive pulmonary disease, diabetes, hypertension, coronary artery disease, oxygen dependent at home, history of lung cancer successfully treated 3 years ago, came to the hospital for the last 2 to 3 days with complaint of not feeling well, tired, fatigued, and short of breath, found having some infiltrate in perihilar region on chest CT.   1.  Pneumonia:  We will give her Levaquin, get blood culture and follow for the improvement.  2.  Diabetes:  We will not give oral antidiabetic medication at this time and just need a glucose check, insulin sliding scale coverage.  3.  History of chronic obstructive pulmonary disease and chronic oxygen use: Currently, there is no wheezing and no exacerbation symptoms. We will continue baseline Spiriva and nebulizer treatment and oxygen supplementation here.  4.  History of coronary artery disease:  We will continue Aggrenox, statin, and metoprolol as she was taking at home.  5.  Hypertension:  We will give metoprolol and monitor the blood pressure for now.  6.  History of lung cancer:  Status post chemo and radiation 3 years ago. Chest CT does not show any recurrence of the malignancy. The patient is already following with her oncologist.  She was advised to give her report of the CAT scan on  discharge to be taken with her oncologist, and so he can compare with the previous scan.   TOTAL TIME SPENT ON THIS ADMISSION: 50 minutes.   CODE STATUS:  FULL CODE.     ____________________________ Ceasar Lund Anselm Jungling, MD vgv:cb D: 10/29/2012 17:12:03 ET T: 10/29/2012 17:39:24 ET JOB#: [id number]  cc: Ceasar Lund. Anselm Jungling, MD, <Dictator>   Lorelee Market, MD  Vaughan Basta MD ELECTRONICALLY SIGNED 10/30/2012 20:11

## 2014-09-19 NOTE — Discharge Summary (Signed)
PATIENT NAMEKEILEY, [name] MR#:  [id number] DATE OF BIRTH:  [birth year]  DATE OF ADMISSION:  10/29/2012 DATE OF DISCHARGE:  11/01/2012  PRIMARY CARE PHYSICIAN: Dr. Brunetta Genera.   ONCOLOGIST: Dr. Murvin Natal.   FINAL DIAGNOSES:  1. Acute on chronic respiratory failure.  2. Pneumonia.  3. Weakness.  4. History of lung cancer.  5. Diabetes.  6. Hypothyroidism.  7. Headaches.  8. Right shoulder pain.   9. Constipation.   I do recommend a followup CT scan of the chest in 6 weeks to ensure pneumonia clearing.   MEDICATIONS ON DISCHARGE: Include Lotrel 10/20 one tablet daily, spironolactone 25 mg daily, Trilipix 135 mg daily, levothyroxine 100 mcg daily, Imdur 60 mg twice a day, glipizide 10 mg daily, Singulair 10 mg daily, Januvia 100 mg daily, Dexilant 60 mg daily, Aggrenox 25/200 one tablet twice a day, Integra F oral capsule 1 capsule daily, Crestor 20 mg 1/2 tablet daily, gabapentin 100 mg twice a day, metoprolol ER 50 mg 1/2 tablet twice a day, meloxicam 7.5 mg every other day, Linzess 290 mcg capsule 1 capsule daily, torsemide 20 mg 1/2 to 1 tablet daily as needed, Spiriva 1 inhalation daily, levalbuterol 3 mL inhalation every 4 hours as needed, Fioricet 1 tablet every 4 hours as needed for headache, Flonase 50 mcg per inhalation 1 to 2 sprays each nostril daily, Xopenex HFA CFC 1 puff every 4 hours as needed, meclizine 25 mg twice a day as needed for dizziness, levofloxacin 750 mg every 48 hours for 4 more doses, hydromorphone 2 mg 1 tablet twice a day as needed for pain.   HOME HEALTH: Yes. Physical therapy, occupational therapy, nurse and nurse aid.   OXYGEN: 2 liters nasal cannula.   DIET: Low-sodium, carbohydrate-controlled diet, regular consistency.   ACTIVITY: As tolerated.   FOLLOWUP: With home health, Dr. Murvin Natal, oncology, as scheduled, 1 to 2 weeks with Dr. Brunetta Genera.   HOSPITAL COURSE: The patient was admitted 10/29/2012 and discharged 11/01/2012. Came in with shortness of breath  and weakness. Admitted with pneumonia and started on IV Levaquin.   LABORATORY AND RADIOLOGICAL DATA DURING THE HOSPITAL COURSE: Included an EKG that showed sinus rhythm, first-degree AV block. Chest x-ray showed spiculated density right hilar region. Troponin negative. Glucose 121, BUN 26, creatinine 1.07, sodium 138, potassium 4.7, chloride 104, CO2 26, calcium 9.6. Liver function tests normal. White blood cell count 4.1, H and H 11.1 and 32.8, platelet count of 282. Urinalysis 1+ leukocyte esterase. CT angiography chest showed no thoracic aortic aneurysm. No pulmonary embolism. Perihilar infiltrate present on the right. Malignancy less likely but cannot be excluded. Subpleural nodular density left lower lobe. Followup 3 to 6 months recommended. Blood cultures negative. Influenza A and B negative. White blood cell count upon discharge 4.1.   HOSPITAL COURSE PER PROBLEM LIST:  1. For the patient's acute on chronic respiratory failure, the patient is on chronic oxygen via nasal cannula. COPD had remained relatively stable on her inhalers. No need for steroids.  2. Pneumonia: The patient was treated with IV Levaquin. Developed a swelling at the IV site. The patient was switched over to p.o. Levaquin. The patient tolerated this well and will be discharged home on that. A CT scan of the chest did show pneumonia. I do recommend a CT scan of the chest in 6 weeks to ensure pneumonia clearing with her history of lung cancer.  3. Weakness: Physical therapy saw her. Recommended rehab. The patient did not want to go to rehab  and wanted to go home. She was set up with home health. She does live with her son who is there. The patient, again, wanted to go home.  4. History of lung cancer: Recommend a repeat CT scan of the chest in 6 weeks to ensure pneumonia clearing.  5. Diabetes: She was kept on her usual meds.  6. Hypothyroidism: She is on levothyroxine.  7. For her headache, she is on Fioricet.   8. Right  shoulder pain: Will get occupational therapy to see.  9. Constipation: She does use p.r.n. Senna at home.   TIME SPENT ON DISCHARGE: 35 minutes.   ____________________________ Tana Conch. Leslye Peer, MD rjw:gb D: 11/01/2012 17:38:47 ET T: 11/01/2012 22:55:01 ET JOB#: [id number]  cc: Tana Conch. Leslye Peer, MD, <Dictator> Meindert A. Brunetta Genera, MD Levonne Hubert, MD  Marisue Brooklyn MD ELECTRONICALLY SIGNED 11/03/2012 15:13

## 2014-09-20 NOTE — Discharge Summary (Signed)
PATIENT NAMEDELOROS, [name] MR#:  [id number] DATE OF BIRTH:  1925/06/06  DATE OF ADMISSION:  12/10/2013 DATE OF DISCHARGE:  12/11/2013  DISCHARGE DIAGNOSES:  1. B12 deficiency. 2. Hisotory  of previous stroke. 3. History of hypertension. 4. History of lung cancer. 5. Diabetes mellitus type 2. 6. Allergies.  MEDICATIONS: Spironolactone 25 mg p.o. daily, lotrel 10-20 mg p.o. daily, trilipix 135 mg p.o. daily, Imdur 60 mg p.o. b.i.d., glipizide 10 mg p.o. daily, montelukast 10 mg p.o. daily, Januvia 100 mg p.o. daily, Dexilant 60 mg p.o. daily, aggrenox 25 mg p.o. b.i.d., integra F oral capsule 1 capsule daily, crestor 20 mg half tablet daily, gabapentin 100 mg 1 capsule p.o. b.i.d., metoprolol succinate 50 mg half tablet p.o. b.i.d., Mobic 7.5 mg every other day, torsemide 20 mg half to 1 tablet p.o. daily as needed, Spiriva 18 mcg p.o. daily, Levalbuterol 1.25 mg/3 mL inhaled every 4 hours, fioricet 325 mg 1 tablet every 4 hours for headache, flonese 50 mcg 1-2 sprays each nostril daily, Xopenex HFA CFC Free 45 mcg 1 puff inhaled every 4 hours, meclizine 25 mg p.o. b.i.d., levothyroxine 150 mcg p.o. daily, vitamin B12 injection every week for  4 weeks.  DISPOSITION:  Discharge home with home physical therapy.  HOSPITAL COURSE:  This patient  with  multiple medical problems of htn, hyperlipidemia, type 2 diabetes mellitus,  history of previous strokes, h.olung cancer who follows up with  dr.Neimeyer comes in with confusion and slurred speech.please look at H` P for full details.  CT head was unremarkable.MRI brain did not show any new strokes.carotid sono was negative for hemodynimically significant stenosis.echo showed EF more than 55%.MRi brain negative for new strokes.b12 level was 132. advised her to follow  up with ENT for vestbular vertigo.b12 injection was given and gave prescription for Vit b12.     ____________________________ Epifanio Lesches, MD sk:dd D: 12/11/2013 22:18:00  ET T: 12/12/2013 04:27:33 ET JOB#: [id number]  cc: Epifanio Lesches, MD, <Dictator> Epifanio Lesches MD ELECTRONICALLY SIGNED 12/27/2013 8:29

## 2014-09-20 NOTE — Discharge Summary (Signed)
PATIENT NAME:  [name], [name] MR#:  [id number] DATE OF BIRTH:  1925-07-20  DATE OF ADMISSION:  12/10/2013 DATE OF DISCHARGE:  12/11/2013  The patient's original discharge summary done by me on July 15th, but with the dictation anomalies please disregard that and  consider this as  the corrected  discharge summary.    DISCHARGE DIAGNOSES: 1.  Left facial numbness due to B12 deficiency. 2.  History of previous stroke.  3.  Hypertension.  4.  History of lung cancer. 5.  Hyperlipidemia.  DISCHARGE MEDICATIONS: 1.  Lotrel 10/20 mg 1 capsule daily.  2.  Aldactone 25 mg p.o. daily.  3.  Trilipix 135 mg p.o. daily.  4.  Imdur 60 mg p.o. b.i.d.  5.  Glipizide XL 10 mg p.o. daily.  6.  Singulair 10 mg p.o. daily.  7.  Januvia 100 mg p.o. daily.  8.  Dexilant 60 mg p.o. daily.  9.  Aggrenox 25/200 mg 1 capsule p.o. b.i.d.  10.  Integra 1 capsule daily. 11.  Crestor 20 mg half tablet daily.  12.  Metoprolol succinate 50 mg half tablet p.o. b.i.d.  13.  Mobic 7.5 mg p.o. every other day.  14.  Torsemide 20 mg half to 1 tablet every day as needed.  15.  Spiriva 18 mcg inhalation daily.  16.  Albuterol nebulizer as needed for wheezing every 4 hours.  17.  Fioricet 1 tablet every 4 hours as needed for headache.  18.  Flonase 50 mcg 1 to 2 sprays each nostril daily.  19.  Xopenex 1 puff every 4 hours as needed.  20.  Meclizine 25 mg p.o. b.i.d.  21.  Levothyroxine 150 mcg p.o. daily.  22.  Vitamin B12 1000 mcg injection every week for 4 weeks, and she also had 1000 mcg one dose here IM.   CONSULTATIONS: None.   HOSPITAL COURSE: The patient is an 81 year old female with multiple medical problems of hypertension, hyperlipidemia, and previous stroke who came in because of left-sided numbness. The patient noted to have left-sided numbness and some slurred speech, as per the daughter. Look at the history and physical for full details. The patient was admitted to hospitalist service for TIA  evaluation. The patient's vitals were normal. The patient's CT head was unremarkable on admission. EKG did not show any acute ST-T changes. Labs were normal. The patient continued on aspirin and also statins, and the patient was continued on Aggrenox and her statins and neuro checks were followed every 4 hours. The patient had a MRI of the brain, carotid ultrasound and echocardiogram. The patient's echo showed an EF of 65% to 70% . The patient's ultrasound of the carotids showed no hemodynamically significant stenosis. MRA of the brain showed previous strokes effecting thalamus and basal ganglia, but no acute stroke. Brain atrophy was evident. The patient's LDL was at goal at 75. The patient's chest x-ray did not reveal any pneumonias. The patient has history of lung cancer and it showed lung cancer only. B12 levels were sent and B12 level was very low at 132. The patient's workup was negative and I told her the numbness could be secondary to B12 and I ordered a B12 injection for her at 1000 mcg and I wrote a prescription to take the injection of 1000 mcg of B12 every week for 4 weeks and then follow up with her primary doctor, Dr. Brunetta Genera. The patient also has an appointment with Dr. Jennings Books from neurology. The patient is battling with vertigo,  which is refractory to meclizine. We asked physical therapy to see her and physical therapy recommended home physical therapy and the physical therapist spoke to me and said she needs vestibular physical therapy and also ENT appointment, so I told the patient that she needs to see the ENT doctor regarding her vertigo and possible vestibular exercises. The patient is discharged home with home health.  She is advised to continue medications for her previous stroke, hyperlipidemia, COPD and hypertension.   DISCHARGE VITALS: Temperature 98.3, heart rate 62, blood pressure 120/77, and sats 96%.  TIME SPENT ON DISCHARGE PREPARATION: More than 30 minutes.    ____________________________ Epifanio Lesches, MD sk:sb D: 12/13/2013 11:38:45 ET T: 12/13/2013 13:52:41 ET JOB#: [id number]  cc: Epifanio Lesches, MD, <Dictator> Epifanio Lesches MD ELECTRONICALLY SIGNED 12/20/2013 11:17

## 2014-09-20 NOTE — H&P (Signed)
PATIENT NAME:  [name], REID MR#:  [id number] DATE OF BIRTH:  07-31-1925  DATE OF ADMISSION:  12/10/2013  PRIMARY CARE PROVIDER: Dr. Brunetta Genera   PRIMARY CARDIOLOGIST: Dr. Humphrey Rolls   PRIMARY NEUROLOGY: Dr. Manuella Ghazi  CHIEF COMPLAINT: Left-sided numbness.   HISTORY OF PRESENTING ILLNESS: An 81 year old Caucasian female patient with history of chronic headaches, vertigo, and balance problems who walks with a walker, presents to the Emergency Room complaining of persistent left-sided numbness since today morning. Her daughter, who is presently at bedside, was talking to her on the phone in the morning, thought she had slurred speech and was confused. Presently, she does not have the slurred speech.  This has resolved and the patient is alert and oriented x 3.   The patient has also been feeling a little weak with left leg weakness. Presently feels numb all through the left side. The patient has had balance problems for a long time, is on B12 shots, walks with rollator.   She also has history of lung cancer, which is not being treated by Dr. Grayland Ormond.   She had a CT scan of the head done today in the ER which showed no evidence of acute intracranial abnormality.   PAST MEDICAL HISTORY:  1. Chronic obstructive pulmonary disease.  2. Coronary artery disease.  3. Lung cancer, not on treatment.  4. Anemia.  5. Hypertension.  6. CVA.  7. Diabetes mellitus.  8. CHF. 9. Total left hip replacement.  10. Left cataract surgery. 11. Cholecystectomy.  12. Partial thyroidectomy.  13. B12 deficiency.  14. Chronic vertigo. 15. Pernicious anemia.   FAMILY HISTORY:  Son had lung cancer.   SOCIAL HISTORY: Does not smoke. No alcohol. No illicit drugs. Walks with a rollator.   CODE STATUS: Full code.   REVIEW OF SYSTEMS:  CONSTITUTIONAL: Complains of some fatigue.  EYES: No blurred vision, pain or redness.  EARS, NOSE, AND THROAT:  No tinnitus. Has hearing loss. Has vertigo.  RESPIRATORY: Has chronic  obstructive pulmonary disease.  CARDIOVASCULAR: No chest pain, orthopnea, edema.  GASTROINTESTINAL: No nausea, vomiting, diarrhea, abdominal pain.  GENITOURINARY: No dysuria, hematuria, or frequency.  ENDOCRINE: No polyuria, nocturia, thyroid problems.  HEMATOLOGIC AND LYMPHATIC: No anemia, easy bruising, dizziness.  INTEGUMENTARY: No acne, rash, lesion.  MUSCULOSKELETAL: Had some neck pain.  NEUROLOGIC: Has numbness on the left side along with left lower extremity weakness.  PSYCHIATRIC: Has anxiety.   HOME MEDICATIONS:  1. Aggrenox 25/200 one capsule 2 times a day.  2. Crestor 20 mg half a tablet daily.  3. Dexilant 60 mg daily.  4. Fioricet 1 tablet every 4 hours as needed.  5. Flonase 1 or 2 sprays nasal each nostril once a day.  6. Gabapentin 100 mg 2 times a day.  7. Glipizide XL 10 mg daily.  8. Cymbalta 60 mg 2 times a day.  9. Integra F oral once a day.  10. Januvia 100 mg daily.  11. Xopenex 3 mL inhaled every 4 hours as needed.  12. Levothyroxine 150 mcg daily.  13. Lotrel 10/20 one tablet oral once a day. 14. Meclizine 25 mg oral 2 times a day as needed for dizziness.  15. Meloxicam 7.5 mg oral once a day.  16. Metoprolol succinate 50 mg extended release 0.5 tablets oral 2 times a day.  17. Montelukast 10 mg oral once a day.   18. Spiriva 18 mcg inhaled once a day.  19. Torsemide 20 mg half a tablet every day as needed.  20.  Trilipix 135 mg oral once a day.   PHYSICAL EXAMINATION:  VITAL SIGNS: Temperature 98.4, pulse 83, blood pressure 148/68, saturating 100% on oxygen.  GENERAL: Obese Caucasian female patient lying in bed, seems comfortable, conversational, cooperative with exam.  PSYCHIATRIC: Alert and oriented x 3. Mood and affect appropriate. Judgment intact.  HEENT:  Head normocephalic.  oral mucosa moist and pink. External ears and nose normal. No pallor. No icterus. Pupils were equal and react to light.  NECK: Supple. No thyromegaly or palpable lymph nodes. .  Trachea midline. No carotid bruit, JVD.  CARDIOVASCULAR: S1, S2, without any murmurs. Peripheral pulses 2+. No edema.  RESPIRATORY: Normal work of breathing. Clear to auscultation on both sides.  GASTROINTESTINAL: Soft abdomen, nontender. Bowel sounds present. No hepatosplenomegaly palpable.  GENITOURINARY: No CVA tenderness or bladder distention.  SKIN:  Warm and dry.  No petechiae, rash, ulcers.  MUSCULOSKELETAL: No joint swelling, redness or effusion in large joints. Normal muscle tone.  NEUROLOGICAL: Motor strength 5/5 on the right side and left upper extremity; 4/5 in the left lower extremity.  Numbness on the left side with decreased sensation to fine touch. Cranial nerves II-XII intact.  LYMPHATIC: No cervical lymphadenopathy.   LABORATORY AND RADIOLOGICAL DATA:   Showed chest x-ray with right infrahilar mass. CT scan of the head without contrast showed nothing acute.   Glucose 99, BUN 29, creatinine 1.38, with AST, ALT, alkaline phosphatase, and bilirubin normal. Troponin less than 0.02. WBC 4.8, hemoglobin 9.6, platelets of 253,000 with INR of 1.   Urinalysis shows no bacteria.   EKG shows normal sinus rhythm. No acute ST-T wave changes.   ASSESSMENT AND PLAN:  1. Left-sided numbness with left lower extremity weakness. Concerning for cerebrovascular accident or mets from her prior existing lung cancer. We will put her on a tele floor with neuro checks q. 4 hours. Continue the Aggrenox, statin patient is on. CT does not show any bleed. Will be on Lovenox for deep vein thrombosis prophylaxis. We will get an MRI, carotid Doppler, echocardiogram. We will also have physical therapy see the patient. Her numbness and the weakness could be from a B12 deficiency that she has had for a long time. We will check a B12 level. The patient will need physical therapy while going home and maybe home health.  2. Chronic vertigo.  The patient is following up with Dr. Manuella Ghazi of neurology who she saw 2 days  prior. We will need followup as outpatient. Continue meclizine p.r.n.  3. Chronic obstructive pulmonary disease.  4. Chronic respiratory failure, stable.  5. Diabetes mellitus. Put her on sliding scale insulin. Continue glipizide.  6. Congestive heart failure, stable. No fluid overload.  7. Lung cancer. Follow up with Dr. Grayland Ormond.  8. Deep venous thrombosis prophylaxis, Lovenox.  CODE STATUS: Full code.    TIME SPENT ON THIS CASE:  Was 45 minutes.    ____________________________ Leia Alf Mickelle Goupil, MD srs:dd D: 12/10/2013 17:55:05 ET T: 12/10/2013 18:09:54 ET JOB#: [id number]  cc: Alveta Heimlich R. Jens Siems, MD, <Dictator> Meindert A. Brunetta Genera, MD Dionisio David, MD Kathlene November. Grayland Ormond, MD Hemang K. Manuella Ghazi, MD Neita Carp MD ELECTRONICALLY SIGNED 12/18/2013 17:21

## 2014-09-20 NOTE — Discharge Summary (Signed)
PATIENT NAMECALINA, [name] MR#:  [id number] DATE OF BIRTH:  June 24, 1925  DATE OF ADMISSION:  12/10/2013 DATE OF DISCHARGE:  12/11/2013  ADDENDUM:  Patient seen by physical therapy.   They recommended home physical therapy.   Patient needs ENT followup for vestibular  cause of vertigo.> .   I have also told the patient that patient needs ENT workup ,her dizziness thought to be secondary to her vertigo. The patient has been having terrible vertigo and she was taking meclizine without any relief. The patient needs ENT and that can be arranged through Dr. Brunetta Genera and we have requested physical therapy evaluation here and physical therapy saw the patient and they recommended home physical therapy for balance issues and also same is discussed with the daughter.   TIME SPENT: More than 35 minutes.   DISCHARGE VITAL SIGNS: Temperature 98.3, heart rate 62, blood pressure is 120/77, sats 96% on 3 liters.   ____________________________ Epifanio Lesches, MD sk:dd D: 12/11/2013 22:20:48 ET T: 12/12/2013 04:58:18 ET JOB#: [id number]  cc: Epifanio Lesches, MD, <Dictator> Epifanio Lesches MD ELECTRONICALLY SIGNED 12/27/2013 8:17

## 2014-09-28 NOTE — Op Note (Signed)
PATIENT NAMESHARISSA, [name] MR#:  [id number] DATE OF BIRTH:  1925/12/31  DATE OF PROCEDURE:  08/18/2014  PREOPERATIVE DIAGNOSIS:  Nuclear sclerotic cataract, right eye.   POSTOPERATIVE DIAGNOSIS:  Nuclear sclerotic cataract, right eye.  PROCEDURE PERFORMED:  Extracapsular cataract extraction using phacoemulsification with placement of an Alcon SN60WF, 22.0 diopter posterior chamber lens, serial #[id number].    ANESTHESIA:  4% lidocaine and 0.75% Marcaine in a 50/50 mixture with 10 units per mL of Hylenex added given as a peribulbar. CDE 44.99.  ANESTHESIOLOGIST:  Dr. Andree Elk.  COMPLICATIONS:  None.  ESTIMATED BLOOD LOSS:  Less than 1 ml.  DESCRIPTION OF PROCEDURE:  The patient was brought to the operating room and given a peribulbar block.  The patient was then prepped and draped in the usual fashion.  The vertical rectus muscles were imbricated using 5-0 silk sutures.  These sutures were then clamped to the sterile drapes as bridle sutures.  A limbal peritomy was performed extending two clock hours and hemostasis was obtained with cautery.  A partial thickness scleral groove was made at the surgical limbus and dissected anteriorly in a lamellar dissection using an Alcon crescent knife.  The anterior chamber was entered superonasally with a Superblade and through the lamellar dissection with a 2.6 mm keratome.  DisCoVisc was used to replace the aqueous and a continuous tear capsulorrhexis was carried out.  Hydrodissection and hydrodelineation were carried out with balanced salt and a 27 gauge canula.  The nucleus was rotated to confirm the effectiveness of the hydrodissection.  Phacoemulsification was carried out using a divide-and-conquer technique.  Total ultrasound time was 1 minute and 44.4 seconds with an average power of 24.5%.  Irrigation/aspiration was used to remove the residual cortex.  DisCoVisc was used to inflate the capsule and the internal incision was enlarged to 3 mm with the  crescent knife.  The intraocular lens was folded and inserted into the capsular bag using the Acrysert delivery system.  Irrigation/aspiration was used to remove the residual DisCoVisc.  Miostat was injected into the anterior chamber through the paracentesis track to inflate the anterior chamber and induce miosis.  Cefuroxime 0.1 mL containing 1 mg of drug was injected via the paracentesis tract.  The wound was checked for leaks and none were found. The conjunctiva was closed with cautery and the bridle sutures were removed.  Two drops of 0.3% Vigamox were placed on the eye.   An eye shield was placed on the eye.  The patient was discharged to the recovery room in good condition.   ____________________________ Loura Back Rue Tinnel, MD sad:sp D: 08/18/2014 14:07:56 ET T: 08/18/2014 14:36:06 ET JOB#: [id number]  cc: Remo Lipps A. Jennine Peddy, MD, <Dictator> Martie Lee MD ELECTRONICALLY SIGNED 08/25/2014 11:14

## 2014-12-17 ENCOUNTER — Other Ambulatory Visit: Payer: Self-pay | Admitting: Internal Medicine

## 2014-12-17 DIAGNOSIS — C3490 Malignant neoplasm of unspecified part of unspecified bronchus or lung: Secondary | ICD-10-CM

## 2014-12-25 ENCOUNTER — Ambulatory Visit: Payer: BLUE CROSS/BLUE SHIELD

## 2015-01-01 ENCOUNTER — Other Ambulatory Visit: Payer: Self-pay | Admitting: Internal Medicine

## 2015-01-01 ENCOUNTER — Ambulatory Visit
Admission: RE | Admit: 2015-01-01 | Discharge: 2015-01-01 | Disposition: A | Discharge status: Home / Self Care | Payer: Medicare Other | Source: Ambulatory Visit | Attending: Internal Medicine | Admitting: Internal Medicine

## 2015-01-01 DIAGNOSIS — Z85118 Personal history of other malignant neoplasm of bronchus and lung: Secondary | ICD-10-CM | POA: Diagnosis present

## 2015-01-01 DIAGNOSIS — I2510 Atherosclerotic heart disease of native coronary artery without angina pectoris: Secondary | ICD-10-CM | POA: Diagnosis not present

## 2015-01-01 DIAGNOSIS — R911 Solitary pulmonary nodule: Secondary | ICD-10-CM | POA: Insufficient documentation

## 2015-01-01 DIAGNOSIS — J984 Other disorders of lung: Secondary | ICD-10-CM | POA: Diagnosis not present

## 2015-01-01 DIAGNOSIS — C3490 Malignant neoplasm of unspecified part of unspecified bronchus or lung: Secondary | ICD-10-CM

## 2015-02-03 ENCOUNTER — Ambulatory Visit: Payer: BLUE CROSS/BLUE SHIELD

## 2015-02-03 ENCOUNTER — Other Ambulatory Visit: Payer: BLUE CROSS/BLUE SHIELD

## 2015-02-03 ENCOUNTER — Ambulatory Visit: Payer: BLUE CROSS/BLUE SHIELD | Admitting: Oncology

## 2015-03-23 ENCOUNTER — Other Ambulatory Visit: Payer: Self-pay | Admitting: Internal Medicine

## 2015-03-23 DIAGNOSIS — C3491 Malignant neoplasm of unspecified part of right bronchus or lung: Secondary | ICD-10-CM

## 2015-03-30 ENCOUNTER — Encounter
Admission: RE | Admit: 2015-03-30 | Discharge: 2015-03-30 | Disposition: A | Discharge status: Home / Self Care | Payer: Medicare Other | Source: Ambulatory Visit | Attending: Internal Medicine | Admitting: Internal Medicine

## 2015-03-30 ENCOUNTER — Other Ambulatory Visit: Payer: Self-pay | Admitting: Internal Medicine

## 2015-03-30 DIAGNOSIS — M545 Low back pain, unspecified: Secondary | ICD-10-CM

## 2015-03-30 DIAGNOSIS — C3491 Malignant neoplasm of unspecified part of right bronchus or lung: Secondary | ICD-10-CM | POA: Diagnosis present

## 2015-03-30 DIAGNOSIS — G8929 Other chronic pain: Secondary | ICD-10-CM

## 2015-03-30 HISTORY — DX: Type 2 diabetes mellitus without complications: E11.9

## 2015-03-30 LAB — GLUCOSE, CAPILLARY: Glucose-Capillary: 81 mg/dL (ref 65–99)

## 2015-03-30 MED ORDER — FLUDEOXYGLUCOSE F - 18 (FDG) INJECTION
11.8200 | Freq: Once | INTRAVENOUS | Status: DC | PRN
  Administered 2015-03-30: 11.82 via INTRAVENOUS
  Filled 2015-03-30: qty 11.82

## 2015-06-02 ENCOUNTER — Other Ambulatory Visit: Payer: Self-pay | Admitting: Internal Medicine

## 2015-06-02 DIAGNOSIS — C3491 Malignant neoplasm of unspecified part of right bronchus or lung: Secondary | ICD-10-CM

## 2015-06-05 ENCOUNTER — Encounter
Admission: RE | Admit: 2015-06-05 | Discharge: 2015-06-05 | Disposition: A | Discharge status: Home / Self Care | Payer: Medicare Other | Source: Ambulatory Visit | Attending: Internal Medicine | Admitting: Internal Medicine

## 2015-06-05 DIAGNOSIS — C3491 Malignant neoplasm of unspecified part of right bronchus or lung: Secondary | ICD-10-CM | POA: Diagnosis not present

## 2015-06-05 LAB — GLUCOSE, CAPILLARY: Glucose-Capillary: 81 mg/dL (ref 65–99)

## 2015-06-05 MED ORDER — FLUDEOXYGLUCOSE F - 18 (FDG) INJECTION
12.4300 | Freq: Once | INTRAVENOUS | Status: AC | PRN
  Administered 2015-06-05: 12.43 via INTRAVENOUS

## 2015-06-27 ENCOUNTER — Inpatient Hospital Stay
Admission: EM | Admit: 2015-06-27 | Discharge: 2015-06-29 | DRG: 683 | Disposition: A | Discharge status: Home Health | Payer: Medicare Other | Attending: Specialist | Admitting: Specialist

## 2015-06-27 ENCOUNTER — Emergency Department: Payer: Medicare Other

## 2015-06-27 ENCOUNTER — Encounter: Payer: Self-pay | Admitting: Emergency Medicine

## 2015-06-27 DIAGNOSIS — R109 Unspecified abdominal pain: Secondary | ICD-10-CM

## 2015-06-27 DIAGNOSIS — Z888 Allergy status to other drugs, medicaments and biological substances status: Secondary | ICD-10-CM

## 2015-06-27 DIAGNOSIS — I10 Essential (primary) hypertension: Secondary | ICD-10-CM | POA: Diagnosis present

## 2015-06-27 DIAGNOSIS — N179 Acute kidney failure, unspecified: Secondary | ICD-10-CM | POA: Diagnosis not present

## 2015-06-27 DIAGNOSIS — D72819 Decreased white blood cell count, unspecified: Secondary | ICD-10-CM | POA: Diagnosis present

## 2015-06-27 DIAGNOSIS — E039 Hypothyroidism, unspecified: Secondary | ICD-10-CM | POA: Diagnosis present

## 2015-06-27 DIAGNOSIS — N390 Urinary tract infection, site not specified: Secondary | ICD-10-CM | POA: Diagnosis present

## 2015-06-27 DIAGNOSIS — E875 Hyperkalemia: Secondary | ICD-10-CM | POA: Diagnosis present

## 2015-06-27 DIAGNOSIS — I2510 Atherosclerotic heart disease of native coronary artery without angina pectoris: Secondary | ICD-10-CM | POA: Diagnosis present

## 2015-06-27 DIAGNOSIS — Z9981 Dependence on supplemental oxygen: Secondary | ICD-10-CM | POA: Diagnosis not present

## 2015-06-27 DIAGNOSIS — E162 Hypoglycemia, unspecified: Secondary | ICD-10-CM

## 2015-06-27 DIAGNOSIS — E11649 Type 2 diabetes mellitus with hypoglycemia without coma: Secondary | ICD-10-CM | POA: Diagnosis present

## 2015-06-27 DIAGNOSIS — Z85118 Personal history of other malignant neoplasm of bronchus and lung: Secondary | ICD-10-CM

## 2015-06-27 DIAGNOSIS — D649 Anemia, unspecified: Secondary | ICD-10-CM | POA: Diagnosis present

## 2015-06-27 DIAGNOSIS — Z8673 Personal history of transient ischemic attack (TIA), and cerebral infarction without residual deficits: Secondary | ICD-10-CM | POA: Diagnosis not present

## 2015-06-27 DIAGNOSIS — J449 Chronic obstructive pulmonary disease, unspecified: Secondary | ICD-10-CM | POA: Diagnosis present

## 2015-06-27 DIAGNOSIS — E860 Dehydration: Secondary | ICD-10-CM | POA: Diagnosis present

## 2015-06-27 DIAGNOSIS — E785 Hyperlipidemia, unspecified: Secondary | ICD-10-CM | POA: Diagnosis present

## 2015-06-27 DIAGNOSIS — Z8249 Family history of ischemic heart disease and other diseases of the circulatory system: Secondary | ICD-10-CM

## 2015-06-27 DIAGNOSIS — Z79899 Other long term (current) drug therapy: Secondary | ICD-10-CM

## 2015-06-27 DIAGNOSIS — Z833 Family history of diabetes mellitus: Secondary | ICD-10-CM | POA: Diagnosis not present

## 2015-06-27 DIAGNOSIS — Z91040 Latex allergy status: Secondary | ICD-10-CM | POA: Diagnosis not present

## 2015-06-27 HISTORY — DX: Malignant (primary) neoplasm, unspecified: C80.1

## 2015-06-27 HISTORY — DX: Atherosclerotic heart disease of native coronary artery without angina pectoris: I25.10

## 2015-06-27 HISTORY — DX: Cerebral infarction, unspecified: I63.9

## 2015-06-27 HISTORY — DX: Essential (primary) hypertension: I10

## 2015-06-27 HISTORY — DX: Chronic obstructive pulmonary disease, unspecified: J44.9

## 2015-06-27 LAB — CBC
HEMATOCRIT: 31.1 % — AB (ref 35.0–47.0)
Hemoglobin: 10.1 g/dL — ABNORMAL LOW (ref 12.0–16.0)
MCH: 29.4 pg (ref 26.0–34.0)
MCHC: 32.6 g/dL (ref 32.0–36.0)
MCV: 90.3 fL (ref 80.0–100.0)
PLATELETS: 232 10*3/uL (ref 150–440)
RBC: 3.44 MIL/uL — ABNORMAL LOW (ref 3.80–5.20)
RDW: 15.2 % — AB (ref 11.5–14.5)
WBC: 2.1 10*3/uL — ABNORMAL LOW (ref 3.6–11.0)

## 2015-06-27 LAB — GLUCOSE, CAPILLARY
GLUCOSE-CAPILLARY: 39 mg/dL — AB (ref 65–99)
GLUCOSE-CAPILLARY: 39 mg/dL — AB (ref 65–99)
GLUCOSE-CAPILLARY: 53 mg/dL — AB (ref 65–99)
GLUCOSE-CAPILLARY: 102 mg/dL — AB (ref 65–99)
GLUCOSE-CAPILLARY: 153 mg/dL — AB (ref 65–99)

## 2015-06-27 LAB — BASIC METABOLIC PANEL
Anion gap: 7 (ref 5–15)
BUN: 32 mg/dL — AB (ref 6–20)
CHLORIDE: 104 mmol/L (ref 101–111)
CO2: 25 mmol/L (ref 22–32)
CREATININE: 2.38 mg/dL — AB (ref 0.44–1.00)
Calcium: 9 mg/dL (ref 8.9–10.3)
GFR calc Af Amer: 20 mL/min — ABNORMAL LOW (ref >60)
GFR calc non Af Amer: 17 mL/min — ABNORMAL LOW (ref >60)
GLUCOSE: 81 mg/dL (ref 65–99)
POTASSIUM: 5.6 mmol/L — AB (ref 3.5–5.1)
SODIUM: 136 mmol/L (ref 135–145)

## 2015-06-27 LAB — URINALYSIS COMPLETE WITH MICROSCOPIC (ARMC ONLY)
BACTERIA UA: NONE SEEN
BILIRUBIN URINE: NEGATIVE
GLUCOSE, UA: NEGATIVE mg/dL
HGB URINE DIPSTICK: NEGATIVE
Ketones, ur: NEGATIVE mg/dL
Leukocytes, UA: NEGATIVE
Nitrite: NEGATIVE
PH: 6 (ref 5.0–8.0)
Protein, ur: NEGATIVE mg/dL
RBC / HPF: NONE SEEN RBC/hpf (ref 0–5)
Specific Gravity, Urine: 1.008 (ref 1.005–1.030)
WBC, UA: NONE SEEN WBC/hpf (ref 0–5)

## 2015-06-27 LAB — HEPATIC FUNCTION PANEL
ALK PHOS: 33 U/L — AB (ref 38–126)
ALT: 11 U/L — ABNORMAL LOW (ref 14–54)
AST: 21 U/L (ref 15–41)
Albumin: 3.9 g/dL (ref 3.5–5.0)
Bilirubin, Direct: <0.1 mg/dL — ABNORMAL LOW (ref 0.1–0.5)
TOTAL PROTEIN: 7.3 g/dL (ref 6.5–8.1)

## 2015-06-27 MED ORDER — INSULIN ASPART 100 UNIT/ML ~~LOC~~ SOLN: 0.0000 [IU] | Freq: Every day | SUBCUTANEOUS | Status: DC

## 2015-06-27 MED ORDER — FLUCONAZOLE 100 MG PO TABS
150.0000 mg | ORAL_TABLET | Freq: Once | ORAL | Status: AC
  Administered 2015-06-27: 150 mg via ORAL
  Filled 2015-06-27: qty 2

## 2015-06-27 MED ORDER — LEVOTHYROXINE SODIUM 150 MCG PO TABS
150.0000 ug | ORAL_TABLET | Freq: Every day | ORAL | Status: DC
  Administered 2015-06-28 – 2015-06-29 (×2): 150 ug via ORAL
  Filled 2015-06-27 (×2): qty 1

## 2015-06-27 MED ORDER — ACETAMINOPHEN 325 MG PO TABS
650.0000 mg | ORAL_TABLET | Freq: Four times a day (QID) | ORAL | Status: DC | PRN
Start: 2015-06-27 — End: 2015-06-29
  Administered 2015-06-28: 650 mg via ORAL
  Filled 2015-06-27: qty 2

## 2015-06-27 MED ORDER — ASPIRIN-DIPYRIDAMOLE ER 25-200 MG PO CP12
1.0000 | ORAL_CAPSULE | Freq: Two times a day (BID) | ORAL | Status: DC
  Administered 2015-06-27 – 2015-06-29 (×4): 1 via ORAL
  Filled 2015-06-27 (×4): qty 1

## 2015-06-27 MED ORDER — INSULIN ASPART 100 UNIT/ML ~~LOC~~ SOLN: 0.0000 [IU] | Freq: Three times a day (TID) | SUBCUTANEOUS | Status: DC

## 2015-06-27 MED ORDER — ONDANSETRON HCL 4 MG/2ML IJ SOLN: 4.0000 mg | Freq: Four times a day (QID) | INTRAMUSCULAR | Status: DC | PRN

## 2015-06-27 MED ORDER — DEXTROSE-NACL 5-0.45 % IV SOLN
INTRAVENOUS | Status: DC
  Administered 2015-06-27 – 2015-06-28 (×2): via INTRAVENOUS

## 2015-06-27 MED ORDER — ISOSORBIDE MONONITRATE ER 30 MG PO TB24
60.0000 mg | ORAL_TABLET | Freq: Every day | ORAL | Status: DC
  Administered 2015-06-27 – 2015-06-28 (×2): 60 mg via ORAL
  Filled 2015-06-27 (×2): qty 2

## 2015-06-27 MED ORDER — GABAPENTIN 100 MG PO CAPS
100.0000 mg | ORAL_CAPSULE | Freq: Every day | ORAL | Status: DC
  Administered 2015-06-27 – 2015-06-28 (×2): 100 mg via ORAL
  Filled 2015-06-27 (×2): qty 1

## 2015-06-27 MED ORDER — HEPARIN SODIUM (PORCINE) 5000 UNIT/ML IJ SOLN
5000.0000 [IU] | Freq: Three times a day (TID) | INTRAMUSCULAR | Status: DC
  Administered 2015-06-27 – 2015-06-29 (×5): 5000 [IU] via SUBCUTANEOUS
  Filled 2015-06-27 (×4): qty 1

## 2015-06-27 MED ORDER — TIOTROPIUM BROMIDE MONOHYDRATE 18 MCG IN CAPS
18.0000 ug | ORAL_CAPSULE | Freq: Every day | RESPIRATORY_TRACT | Status: DC
  Administered 2015-06-28 – 2015-06-29 (×2): 18 ug via RESPIRATORY_TRACT
  Filled 2015-06-27: qty 5

## 2015-06-27 MED ORDER — ACETAMINOPHEN 650 MG RE SUPP
650.0000 mg | Freq: Four times a day (QID) | RECTAL | Status: DC | PRN
Start: 2015-06-27 — End: 2015-06-29

## 2015-06-27 MED ORDER — MORPHINE SULFATE (PF) 2 MG/ML IV SOLN: 2.0000 mg | INTRAVENOUS | Status: DC | PRN

## 2015-06-27 MED ORDER — OXYCODONE HCL 5 MG PO TABS: 5.0000 mg | ORAL_TABLET | ORAL | Status: DC | PRN

## 2015-06-27 MED ORDER — FLUTICASONE PROPIONATE HFA 110 MCG/ACT IN AERO: 1.0000 | INHALATION_SPRAY | Freq: Two times a day (BID) | RESPIRATORY_TRACT | Status: DC

## 2015-06-27 MED ORDER — BUDESONIDE 0.25 MG/2ML IN SUSP
0.2500 mg | Freq: Two times a day (BID) | RESPIRATORY_TRACT | Status: DC
  Administered 2015-06-28 – 2015-06-29 (×3): 0.25 mg via RESPIRATORY_TRACT
  Filled 2015-06-27 (×3): qty 2

## 2015-06-27 MED ORDER — SODIUM CHLORIDE 0.9% FLUSH
3.0000 mL | Freq: Two times a day (BID) | INTRAVENOUS | Status: DC
  Administered 2015-06-27 – 2015-06-29 (×4): 3 mL via INTRAVENOUS

## 2015-06-27 MED ORDER — MONTELUKAST SODIUM 10 MG PO TABS
10.0000 mg | ORAL_TABLET | Freq: Every day | ORAL | Status: DC
  Administered 2015-06-27 – 2015-06-28 (×2): 10 mg via ORAL
  Filled 2015-06-27 (×2): qty 1

## 2015-06-27 MED ORDER — ONDANSETRON HCL 4 MG PO TABS: 4.0000 mg | ORAL_TABLET | Freq: Four times a day (QID) | ORAL | Status: DC | PRN

## 2015-06-27 MED ORDER — SODIUM CHLORIDE 0.9 % IV SOLN: INTRAVENOUS | Status: DC

## 2015-06-27 MED ORDER — MELOXICAM 7.5 MG PO TABS
7.5000 mg | ORAL_TABLET | Freq: Every day | ORAL | Status: DC
  Administered 2015-06-27 – 2015-06-28 (×2): 7.5 mg via ORAL
  Filled 2015-06-27 (×2): qty 1

## 2015-06-27 MED ORDER — ROSUVASTATIN CALCIUM 5 MG PO TABS
20.0000 mg | ORAL_TABLET | Freq: Every day | ORAL | Status: DC
  Administered 2015-06-28: 18:00:00 20 mg via ORAL
  Filled 2015-06-27: qty 4

## 2015-06-27 MED ORDER — METOPROLOL SUCCINATE ER 50 MG PO TB24
50.0000 mg | ORAL_TABLET | Freq: Every day | ORAL | Status: DC
  Administered 2015-06-28 – 2015-06-29 (×2): 50 mg via ORAL
  Filled 2015-06-27 (×2): qty 1

## 2015-06-27 NOTE — ED Provider Notes (Addendum)
[facility] Emergency Department Provider Note  ____________________________________________   I have reviewed the triage vital signs and the nursing notes.   HISTORY  Chief Complaint Back Pain and Urinary Tract Infection    HPI [name] is a 82 y.o. female presents today with multiple different complaints for the first is a UTI. She states that she went to her primary care doctor in late December and had a urinalysis done and they called her this Monday and told her that her urine was culture positive for UTI and started her on Bactrim. She is then put on Bactrim since Monday. Patient states she has ongoing burning urination and left flank pain since that time. She states that the pain in her left flank is been there constantly since December but is getting worse. She's also had some other episodes which are concerning to her. She states that twice and she has woke up in the night with hypoglycemia. Diffuse tingling in felt that her whole body was numb. Her daughters state that she had that her left side of her body was more weak than the right but the patient does not recollect this. There was no seizure activity but she states she got up and ate something and felt somewhat better. She has no neurologic complaint at this time. Patient has had no fever or chills. She is on oral hypoglycemic agents, and has not had any recent change in the dosing. At this time her biggest complaint is the ongoing left-sided flank pain and ongoing dysuria despite antibiotics.  Past Medical History  Diagnosis Date  . Diabetes mellitus without complication ([place])   . Hypertension   . Stroke ([place])   . COPD (chronic obstructive pulmonary disease) ([place])   . Coronary artery disease   . Cancer [facility])     lung cancer    Patient Active Problem List   Diagnosis Date Noted  . OBSTRUCTIVE SLEEP APNEA 03/15/2007  . CORONARY ARTERY DISEASE 03/15/2007  . ASTHMA, CHRONIC OBSTRUCTIVE NOS  03/15/2007  . FIBROMYALGIA 03/15/2007    History reviewed. No pertinent past surgical history.  No current outpatient prescriptions on file.  Allergies Albuterol; Darvon; Tetracyclines & related; Fentanyl; and Latex  History reviewed. No pertinent family history.  Social History Social History  Substance Use Topics  . Smoking status: Never Smoker   . Smokeless tobacco: Never Used  . Alcohol Use: No    Review of Systems Constitutional: No fever/chills Eyes: No visual changes. ENT: No sore throat. No stiff neck no neck pain Cardiovascular: Denies chest pain. Respiratory: Denies shortness of breath. Gastrointestinal:   no vomiting.  No diarrhea.  No constipation. Genitourinary: Negative for dysuria. Musculoskeletal: All the different chronic lower extremity swelling Skin: Negative for rash. Neurological: Negative for headaches, focal weakness or numbness. 10-point ROS otherwise negative.  ____________________________________________   PHYSICAL EXAM:  VITAL SIGNS: ED Triage Vitals  Enc Vitals Group     BP 06/27/15 1440 142/71 mmHg     Pulse Rate 06/27/15 1440 79     Resp 06/27/15 1440 24     Temp 06/27/15 1440 98 F (36.7 C)     Temp Source 06/27/15 1440 Oral     SpO2 06/27/15 1440 100 %     Weight 06/27/15 1440 200 lb (90.719 kg)     Height 06/27/15 1440 '5\' 5"'$  (1.651 m)     Head Cir --      Peak Flow --      Pain Score 06/27/15  1441 4     Pain Loc --      Pain Edu? --      Excl. in [place]? --     Constitutional: Alert and oriented. Well appearing and in no acute distress. Eyes: Conjunctivae are normal. PERRL. EOMI. Head: Atraumatic. Nose: No congestion/rhinnorhea. Mouth/Throat: Mucous membranes are moist.  Oropharynx non-erythematous. Neck: No stridor.   Nontender with no meningismus Cardiovascular: Normal rate, regular rhythm. Grossly normal heart sounds.  Good peripheral circulation. Respiratory: Normal respiratory effort.  No retractions. Lungs  CTAB. Abdominal: Soft and nontender. No distention. No guarding no rebound Back:  There is no focal tenderness or step off there is no midline tenderness there are no lesions noted. there is left CVA tenderness Musculoskeletal: No lower extremity tenderness. No joint effusions, no DVT signs strong distal pulses symmetric pitting edema Neurologic:  Normal speech and language. No gross focal neurologic deficits are appreciated. Radial nerves are intact, Skin:  Skin is warm, dry and intact. No rash noted. Psychiatric: Mood and affect are normal. Speech and behavior are normal.  ____________________________________________   LABS (all labs ordered are listed, but only abnormal results are displayed)  Labs Reviewed  BASIC METABOLIC PANEL - Abnormal; Notable for the following:    Potassium 5.6 (*)    BUN 32 (*)    Creatinine, Ser 2.38 (*)    GFR calc non Af Amer 17 (*)    GFR calc Af Amer 20 (*)    All other components within normal limits  CBC - Abnormal; Notable for the following:    WBC 2.1 (*)    RBC 3.44 (*)    Hemoglobin 10.1 (*)    HCT 31.1 (*)    RDW 15.2 (*)    All other components within normal limits  URINALYSIS COMPLETEWITH MICROSCOPIC ([facility] ONLY) - Abnormal; Notable for the following:    Color, Urine YELLOW (*)    APPearance CLEAR (*)    Squamous Epithelial / LPF 0-5 (*)    All other components within normal limits  HEPATIC FUNCTION PANEL - Abnormal; Notable for the following:    ALT 11 (*)    Alkaline Phosphatase 33 (*)    Total Bilirubin <0.1 (*)    Bilirubin, Direct <0.1 (*)    All other components within normal limits  URINE CULTURE   ____________________________________________  EKG  I personally interpreted any EKGs ordered by me or triage Normal sinus rhythm rate 70 bpm no acute ST elevation or acute ST depression, normal axis, likely first-degree AV block noted. ____________________________________________  [id number]  I reviewed any imaging ordered  by me or triage that were performed during my shift ____________________________________________   PROCEDURES  Procedure(s) performed: None  Critical Care performed: None  ____________________________________________   INITIAL IMPRESSION / ASSESSMENT AND PLAN / ED COURSE  Pertinent labs & imaging results that were available during my care of the patient were reviewed by me and considered in my medical decision making (see chart for details).  Patient with multiple different complaints. The first is ongoing dysuria and flank pain despite antibiotics. Her urinalysis is reassuring however kidney function is significant. Of her baseline. The second is that she's had 2 different episodes of hypoglycemia #1 of which may have had symptomatically left-sided weakness. CT head and chest abdomen pelvis are reassuring urine is reassuring kidney function however significantly elevated over baseline and this is of concern to me. Could be because of her Bactrim. I do feel that with the elevated creatinine and elevated  potassium with recurrent hypoglycemic events in a patient who is by herself at home warrants further evaluation that I can give in the emergency department  ----------------------------------------- 5:15 PM on 06/27/2015 -----------------------------------------  Patient is awake and alert at this time unsure of the day but otherwise compliant with exam. Moving all fours. I have loaded her with Keppra. She is seen the neurologist. She has some very slight left-sided weakness versus the right at this time the upper extremity. Cranial nerves are otherwise intact. According to family she has been getting gradually more confused over the last several weeks and acutely worse over the last few days. They do not feel the patient requires an emergent LP given the focality of her exam do feel that she will require an MRI and a EEG. We do not provide EEG at this facility patient will need to be  transferred. She is guarding her airway. Her baseline blood pressure as mentioned is between 180 and 200 and she is still in that range. They agree with not being aggressive with blood pressure until we have a better information. The patient has a low-grade temperature after her seizure but before that, her temperature was orally 36.7 and she has not had documented fevers at home. Low suspicion therefore for meningitis. Especially given a several week gradual change in patient's status.    FINAL CLINICAL IMPRESSION(S) / ED DIAGNOSES  Final diagnoses:  Flank pain      This chart was dictated using voice recognition software.  Despite best efforts to proofread,  errors can occur which can change meaning.     Schuyler Amor, MD 06/27/15 [place], MD 06/27/15 Osg[place], MD 06/27/15 [phone]

## 2015-06-27 NOTE — H&P (Addendum)
[place] [facility] [place] NAME: [name]    MR#:  [id number]  DATE OF BIRTH:  14-May-1926   DATE OF ADMISSION:  06/27/2015  PRIMARY CARE PHYSICIAN: Vista Mink, FNP   REQUESTING/REFERRING PHYSICIAN: McShane  CHIEF COMPLAINT:   Low blood sugar  HISTORY OF PRESENT ILLNESS:  [name]  is a 82 y.o. female with a known history of type 2 diabetes non-insulin-requiring who is presenting with multiple episodes of low blood sugar. She states she felt somewhat shaky, checked her glucose noted to be 30s ate drank this happened 2 more times over the last day. Given repeat hypoglycemic decided present to Hospital further workup and evaluation. Of note recently treated urinary tract infection she has completed 5 days of Bactrim and is supposed to receive 2 more days. She does complain of some burning sensation after urination denies further symptomatology at this time.  PAST MEDICAL HISTORY:   Past Medical History  Diagnosis Date  . Diabetes mellitus without complication ([place])   . Hypertension   . Stroke ([place])   . COPD (chronic obstructive pulmonary disease) ([place])   . Coronary artery disease   . Cancer ([place])     lung cancer    PAST SURGICAL HISTORY:  History reviewed. No pertinent past surgical history.  SOCIAL HISTORY:   Social History  Substance Use Topics  . Smoking status: Never Smoker   . Smokeless tobacco: Never Used  . Alcohol Use: No    FAMILY HISTORY:   Family History  Problem Relation Age of Onset  . Diabetes Other   . Hypertension Other     DRUG ALLERGIES:   Allergies  Allergen Reactions  . Albuterol Other (See Comments)    Irregular heart beat, CAN TOLERATE XOPENEX  . Darvon [Propoxyphene]   . Tetracyclines & Related   . Fentanyl Nausea And Vomiting and Rash  . Latex Rash    REVIEW OF SYSTEMS:  REVIEW OF SYSTEMS:  CONSTITUTIONAL: Denies fevers, chills, fatigue, weakness.  EYES: Denies  blurred vision, double vision, or eye pain.  EARS, NOSE, THROAT: Denies tinnitus, ear pain, hearing loss.  RESPIRATORY: denies cough, shortness of breath, wheezing  CARDIOVASCULAR: Denies chest pain, palpitations, edema.  GASTROINTESTINAL: Denies nausea, vomiting, diarrhea, abdominal pain.  GENITOURINARY: Positive dysuria, denies hematuria.  ENDOCRINE: Denies nocturia or thyroid problems. HEMATOLOGIC AND LYMPHATIC: Denies easy bruising or bleeding.  SKIN: Denies rash or lesions.  MUSCULOSKELETAL: Denies pain in neck, back, shoulder, knees, hips, or further arthritic symptoms.  NEUROLOGIC: Denies paralysis, paresthesias.  PSYCHIATRIC: Denies anxiety or depressive symptoms. Otherwise full review of systems performed by me is negative.   MEDICATIONS AT HOME:   Prior to Admission medications   Medication Sig Start Date End Date Taking? Authorizing Provider  amLODipine-benazepril (LOTREL) 10-20 MG capsule Take 1 capsule by mouth daily.   Yes Historical Provider, MD  dipyridamole-aspirin (AGGRENOX) 200-25 MG 12hr capsule Take 1 capsule by mouth daily.   Yes Historical Provider, MD      VITAL SIGNS:  Blood pressure 135/62, pulse 75, temperature 98 F (36.7 C), temperature source Oral, resp. rate 16, height '5\' 5"'$  (1.651 m), weight 200 lb (90.719 kg), SpO2 100 %.  PHYSICAL EXAMINATION:  VITAL SIGNS: Filed Vitals:   06/27/15 1445 06/27/15 1500  BP: 134/60 135/62  Pulse: 78 75  Temp:    Resp: 22 16   GENERAL:82 y.o.female currently in no acute distress. Hard of hearing HEAD: Normocephalic, atraumatic.  EYES: Pupils equal,  round, reactive to light. Extraocular muscles intact. No scleral icterus.  MOUTH: Moist mucosal membrane. Dentition intact. No abscess noted.  EAR, NOSE, THROAT: Clear without exudates. No external lesions.  NECK: Supple. No thyromegaly. No nodules. No JVD.  PULMONARY: Clear to ascultation, without wheeze rails or rhonci. No use of accessory muscles, Good respiratory  effort. good air entry bilaterally CHEST: Nontender to palpation.  CARDIOVASCULAR: S1 and S2. Regular rate and rhythm. No murmurs, rubs, or gallops. No edema. Pedal pulses 2+ bilaterally.  GASTROINTESTINAL: Soft, nontender, nondistended. No masses. Positive bowel sounds. No hepatosplenomegaly.  MUSCULOSKELETAL: No swelling, clubbing, or edema. Range of motion full in all extremities.  NEUROLOGIC: Cranial nerves II through XII are intact. No gross focal neurological deficits. Sensation intact. Reflexes intact.  SKIN: No ulceration, lesions, rashes, or cyanosis. Skin warm and dry. Turgor intact.  PSYCHIATRIC: Mood, affect within normal limits. The patient is awake, alert and oriented x 3. Insight, judgment intact.    LABORATORY PANEL:   CBC  Recent Labs Lab 06/27/15 1447  WBC 2.1*  HGB 10.1*  HCT 31.1*  PLT 232   ------------------------------------------------------------------------------------------------------------------  Chemistries   Recent Labs Lab 06/27/15 1447  NA 136  K 5.6*  CL 104  CO2 25  GLUCOSE 81  BUN 32*  CREATININE 2.38*  CALCIUM 9.0  AST 21  ALT 11*  ALKPHOS 33*  BILITOT <0.1*   ------------------------------------------------------------------------------------------------------------------  Cardiac Enzymes No results for input(s): TROPONINI in the last 168 hours. ------------------------------------------------------------------------------------------------------------------  RADIOLOGY:  Ct Head Wo Contrast  06/27/2015  CLINICAL DATA:  Left facial and body weakness and numbness since this morning. Previous stroke. Lung carcinoma. EXAM: CT HEAD WITHOUT CONTRAST TECHNIQUE: Contiguous axial images were obtained from the base of the skull through the vertex without intravenous contrast. COMPARISON:  12/10/2013 FINDINGS: There is no evidence of intracranial hemorrhage, brain edema, or other signs of acute infarction. There is no evidence of  intracranial mass lesion or mass effect. No abnormal extraaxial fluid collections are identified. Mild to moderate diffuse cerebral and cerebellar atrophy remains stable. No evidence of hydrocephalus. No skull fracture or other bone lesions identified. IMPRESSION: No acute intracranial abnormality. Stable diffuse cerebral and cerebellar atrophy. Electronically Signed   By: Earle Gell M.D.   On: 06/27/2015 16:31   Ct Renal Stone Study  06/27/2015  CLINICAL DATA:  Left-sided flank and back pain starting last night. EXAM: CT ABDOMEN AND PELVIS WITHOUT CONTRAST TECHNIQUE: Multidetector CT imaging of the abdomen and pelvis was performed following the standard protocol without IV contrast. COMPARISON:  PET-CT from 06/05/2015. Abdomen and pelvis CT from 06/25/2009. FINDINGS: Lower chest:  Basilar atelectasis with tiny right pleural effusion. Hepatobiliary: No focal abnormality in the liver on this study without intravenous contrast. No evidence of hepatomegaly. Gallbladder surgically absent. No intrahepatic or extrahepatic biliary dilation. Pancreas: No focal mass lesion. No dilatation of the main duct. No intraparenchymal cyst. No peripancreatic edema. Spleen: No splenomegaly. No focal mass lesion. Adrenals/Urinary Tract: No adrenal nodule or mass. 2.2 cm water density lesion upper pole right kidney compatible with a cyst. Left kidney unremarkable. No renal or ureteral stones. The urinary bladder appears normal for the degree of distention. Stomach/Bowel: Stomach is nondistended. No gastric wall thickening. No evidence of outlet obstruction. Duodenum is normally positioned as is the ligament of Treitz. No small bowel wall thickening. No small bowel dilatation. The terminal ileum is normal. The appendix is normal. No gross colonic mass. No colonic wall thickening. No substantial diverticular change. Vascular/Lymphatic: There is  abdominal aortic atherosclerosis without aneurysm. There is no gastrohepatic or  hepatoduodenal ligament lymphadenopathy. No intraperitoneal or retroperitoneal lymphadenopathy. No pelvic sidewall lymphadenopathy. Reproductive: The uterus has normal CT imaging appearance. There is no adnexal mass. Other: No intraperitoneal free fluid. Musculoskeletal: Patient is status post left total hip replacement. Streak artifact from the prosthesis obscures portions of the left pelvic sidewall. Bone windows reveal no worrisome lytic or sclerotic osseous lesions. IMPRESSION: 1. No CT findings to explain the patient's history of left-sided flank and back pain. 2. 2.2 cm lesion upper pole right kidney compatible with a cyst. 3. Tiny right pleural effusion. 4. Abdominal aortic atherosclerosis. Electronically Signed   By: Misty Stanley M.D.   On: 06/27/2015 16:36    EKG:   Orders placed or performed during the hospital encounter of 06/27/15  . ED EKG  . ED EKG  . EKG 12-Lead  . EKG 12-Lead    IMPRESSION AND PLAN:   82 year old Caucasian female history of type 2 diabetes non-insulin-requiring who is presenting with multiple episodes of low blood glucose  1. Hypoglycemia recurrent: We'll start on D5 half normal saline follow Accu-Chek suspect this is poor clearance of diabetic agents secondary to kidney injury, hold all oral agents for now 2. Acute kidney injury combination of dehydration as well as recent Bactrim usage check renal ultrasound urine sodium and urine creatinine IV fluid hydration and follow kidney function 3. Hyperkalemia: IV fluid hydration and follow potassium 3. Essential hypertension: Toprol 4. Hypothyroidism unspecified Synthroid 5. COPD unspecified: Spiriva Singulair 6. Venous thrombi embolism prophylactic: Heparin subcutaneous    All the records are reviewed and case discussed with ED provider. Management plans discussed with the patient, family and they are in agreement.  CODE STATUS: Full  TOTAL TIME TAKING CARE OF THIS PATIENT: 40 minutes.    Hower,  Karenann Cai.D on 06/27/2015 at 5:50 PM  Between 7am to 6pm - Pager - [phone]  After 6pm: House Pager: - [facility]  Office  [phone]  CC: Primary care physician; Vista Mink, FNP

## 2015-06-27 NOTE — ED Notes (Signed)
Pt arrived via EMS from home. Pt reports her blood sugar was low this morning at 57 and got up and ate. CBG with EMS was 88. Pt also has been treated for UTI for the past week and a half and has been on Bactrim and has 3 doses left. Pt c/o burning and pain with urination as well as Left flank pain and left sided abdominal pain.  Pt also reports having an episode where she thought she was having a seizure and felt her face numb and whole body numb.  Pt checked her sugar and it was low so she ate and the sxs resolved. Pt does state she has some left facial numbness, however, facial symmetry is equal.

## 2015-06-27 NOTE — ED Notes (Signed)
Pt CBG is 39.  Dr. Lavetta Nielsen present in room at the time. CBG checked in both hands. Given patient [place] sandwich and 120 ml of grape juice and 120 ml of cranberry juice. Pt is alert and oriented x4. Pt does have some tremors and says she feels like her sugar is low.

## 2015-06-28 ENCOUNTER — Inpatient Hospital Stay: Payer: Medicare Other

## 2015-06-28 LAB — BASIC METABOLIC PANEL
Anion gap: 3 — ABNORMAL LOW (ref 5–15)
BUN: 22 mg/dL — AB (ref 6–20)
CALCIUM: 8.7 mg/dL — AB (ref 8.9–10.3)
CHLORIDE: 111 mmol/L (ref 101–111)
CO2: 25 mmol/L (ref 22–32)
CREATININE: 1.6 mg/dL — AB (ref 0.44–1.00)
GFR calc Af Amer: 32 mL/min — ABNORMAL LOW (ref >60)
GFR, EST NON AFRICAN AMERICAN: 28 mL/min — AB (ref >60)
Glucose, Bld: 97 mg/dL (ref 65–99)
Potassium: 5.6 mmol/L — ABNORMAL HIGH (ref 3.5–5.1)
SODIUM: 139 mmol/L (ref 135–145)

## 2015-06-28 LAB — GLUCOSE, CAPILLARY
GLUCOSE-CAPILLARY: 84 mg/dL (ref 65–99)
GLUCOSE-CAPILLARY: 120 mg/dL — AB (ref 65–99)
GLUCOSE-CAPILLARY: 94 mg/dL (ref 65–99)
Glucose-Capillary: 119 mg/dL — ABNORMAL HIGH (ref 65–99)

## 2015-06-28 LAB — CBC
HCT: 29.4 % — ABNORMAL LOW (ref 35.0–47.0)
Hemoglobin: 9.5 g/dL — ABNORMAL LOW (ref 12.0–16.0)
MCH: 29.2 pg (ref 26.0–34.0)
MCHC: 32.2 g/dL (ref 32.0–36.0)
MCV: 90.6 fL (ref 80.0–100.0)
PLATELETS: 218 10*3/uL (ref 150–440)
RBC: 3.25 MIL/uL — ABNORMAL LOW (ref 3.80–5.20)
RDW: 15.3 % — AB (ref 11.5–14.5)
WBC: 1.8 10*3/uL — AB (ref 3.6–11.0)

## 2015-06-28 LAB — CREATININE, URINE, RANDOM: Creatinine, Urine: 46 mg/dL

## 2015-06-28 LAB — HEMOGLOBIN A1C: HEMOGLOBIN A1C: 4.9 % (ref 4.0–6.0)

## 2015-06-28 LAB — SODIUM, URINE, RANDOM: Sodium, Ur: 64 mmol/L

## 2015-06-28 MED ORDER — ISOSORBIDE MONONITRATE ER 30 MG PO TB24
60.0000 mg | ORAL_TABLET | Freq: Two times a day (BID) | ORAL | Status: DC
  Administered 2015-06-28 – 2015-06-29 (×2): 60 mg via ORAL
  Filled 2015-06-28 (×2): qty 2

## 2015-06-28 MED ORDER — PHENYLEPH-SHARK LIV OIL-MO-PET 0.25-3-14-71.9 % RE OINT
TOPICAL_OINTMENT | Freq: Two times a day (BID) | RECTAL | Status: DC | PRN
  Filled 2015-06-28: qty 28.4

## 2015-06-28 MED ORDER — MELOXICAM 7.5 MG PO TABS
7.5000 mg | ORAL_TABLET | ORAL | Status: DC
  Administered 2015-06-29: 08:00:00 7.5 mg via ORAL
  Filled 2015-06-28: qty 1

## 2015-06-28 MED ORDER — SODIUM CHLORIDE 0.9 % IV SOLN
INTRAVENOUS | Status: DC
  Administered 2015-06-28: 18:00:00 via INTRAVENOUS

## 2015-06-28 MED ORDER — PANTOPRAZOLE SODIUM 40 MG PO TBEC
40.0000 mg | DELAYED_RELEASE_TABLET | Freq: Every day | ORAL | Status: DC
  Administered 2015-06-28 – 2015-06-29 (×2): 40 mg via ORAL
  Filled 2015-06-28 (×2): qty 1

## 2015-06-28 MED ORDER — PHENYLEPH-SHARK LIV OIL-MO-PET 0.25-3-14-71.9 % RE OINT
TOPICAL_OINTMENT | Freq: Two times a day (BID) | RECTAL | Status: DC
Start: 2015-06-28 — End: 2015-06-29
  Administered 2015-06-29: 08:00:00 via RECTAL
  Filled 2015-06-28: qty 28.4

## 2015-06-28 NOTE — Progress Notes (Signed)
Spoke with Dr. Verdell Carmine about pt's request for preparation H for hemorrhoids.  Also made him aware that pt states she takes her Mobic on Sat, Mon, and Wed. Pt also take her Imdur '60mg'$  twice a day.  Dr. Verdell Carmine gave ok to order these meds.  Clarise Cruz, RN

## 2015-06-28 NOTE — Progress Notes (Signed)
[place] [facility] [place] NAME: [name]    MR#:  [id number]  DATE OF BIRTH:  September 01, 1925  SUBJECTIVE:   Patient here due to weakness and noted to be hypoglycemic. Blood sugars have improved. Also noted to be in acute renal failure which is improved.  REVIEW OF SYSTEMS:    Review of Systems  Constitutional: Negative for fever and chills.  HENT: Negative for congestion and tinnitus.   Eyes: Negative for blurred vision and double vision.  Respiratory: Negative for cough, shortness of breath and wheezing.   Cardiovascular: Negative for chest pain, orthopnea and PND.  Gastrointestinal: Negative for nausea, vomiting, abdominal pain and diarrhea.  Genitourinary: Negative for dysuria and hematuria.  Neurological: Positive for weakness (generalized). Negative for dizziness, sensory change and focal weakness.  All other systems reviewed and are negative.   Nutrition: heart healthy Tolerating Diet: Yes Tolerating PT:  Await Eval.   DRUG ALLERGIES:   Allergies  Allergen Reactions  . Darvon [Propoxyphene] Other (See Comments)    Patient states she was real weak, felt like she was going to pass out.  . Albuterol Palpitations and Other (See Comments)    Irregular heart beat, CAN TOLERATE XOPENEX  . Fentanyl Nausea And Vomiting and Rash  . Latex Rash  . Tetracyclines & Related Rash    VITALS:  Blood pressure 150/49, pulse 86, temperature 98 F (36.7 C), temperature source Oral, resp. rate 17, height '5\' 5"'$  (1.651 m), weight 90.719 kg (200 lb), SpO2 98 %.  PHYSICAL EXAMINATION:   Physical Exam  GENERAL:  82 y.o.-year-old patient lying in the bed with no acute distress.  EYES: Pupils equal, round, reactive to light and accommodation. No scleral icterus. Extraocular muscles intact.  HEENT: Head atraumatic, normocephalic. Oropharynx and nasopharynx clear.  NECK:  Supple, no jugular venous distention. No thyroid enlargement, no  tenderness.  LUNGS: Normal breath sounds bilaterally, no wheezing, rales, rhonchi. No use of accessory muscles of respiration.  CARDIOVASCULAR: S1, S2 normal. No murmurs, rubs, or gallops.  ABDOMEN: Soft, nontender, nondistended. Bowel sounds present. No organomegaly or mass.  EXTREMITIES: No cyanosis, clubbing or edema b/l.    NEUROLOGIC: Cranial nerves II through XII are intact. No focal Motor or sensory deficits b/l.   PSYCHIATRIC: The patient is alert and oriented x 3.  SKIN: No obvious rash, lesion, or ulcer.    LABORATORY PANEL:   CBC  Recent Labs Lab 06/28/15 0551  WBC 1.8*  HGB 9.5*  HCT 29.4*  PLT 218   ------------------------------------------------------------------------------------------------------------------  Chemistries   Recent Labs Lab 06/27/15 1447 06/28/15 0551  NA 136 139  K 5.6* 5.6*  CL 104 111  CO2 25 25  GLUCOSE 81 97  BUN 32* 22*  CREATININE 2.38* 1.60*  CALCIUM 9.0 8.7*  AST 21  --   ALT 11*  --   ALKPHOS 33*  --   BILITOT <0.1*  --    ------------------------------------------------------------------------------------------------------------------  Cardiac Enzymes No results for input(s): TROPONINI in the last 168 hours. ------------------------------------------------------------------------------------------------------------------  RADIOLOGY:  Ct Head Wo Contrast  06/27/2015  CLINICAL DATA:  Left facial and body weakness and numbness since this morning. Previous stroke. Lung carcinoma. EXAM: CT HEAD WITHOUT CONTRAST TECHNIQUE: Contiguous axial images were obtained from the base of the skull through the vertex without intravenous contrast. COMPARISON:  12/10/2013 FINDINGS: There is no evidence of intracranial hemorrhage, brain edema, or other signs of acute infarction. There is no evidence of intracranial mass lesion or  mass effect. No abnormal extraaxial fluid collections are identified. Mild to moderate diffuse cerebral and  cerebellar atrophy remains stable. No evidence of hydrocephalus. No skull fracture or other bone lesions identified. IMPRESSION: No acute intracranial abnormality. Stable diffuse cerebral and cerebellar atrophy. Electronically Signed   By: Earle Gell M.D.   On: 06/27/2015 16:31   US Renal  06/28/2015  CLINICAL DATA:  Acute kidney injury. EXAM: RENAL / URINARY TRACT ULTRASOUND COMPLETE COMPARISON:  CT 06/27/2015 FINDINGS: Right Kidney: Length: 11.7 cm. 2.2 cm cyst in the upper pole. Normal echotexture. No hydronephrosis. Left Kidney: Length: 11.4 cm. 1.9 cm parapelvic cyst. Normal echotexture. No hydronephrosis. Bladder: Appears normal for degree of bladder distention. IMPRESSION: Small bilateral renal cysts.  No acute findings.  No hydronephrosis. Electronically Signed   By: Rolm Baptise M.D.   On: 06/28/2015 08:09   Ct Renal Stone Study  06/27/2015  CLINICAL DATA:  Left-sided flank and back pain starting last night. EXAM: CT ABDOMEN AND PELVIS WITHOUT CONTRAST TECHNIQUE: Multidetector CT imaging of the abdomen and pelvis was performed following the standard protocol without IV contrast. COMPARISON:  PET-CT from 06/05/2015. Abdomen and pelvis CT from 06/25/2009. FINDINGS: Lower chest:  Basilar atelectasis with tiny right pleural effusion. Hepatobiliary: No focal abnormality in the liver on this study without intravenous contrast. No evidence of hepatomegaly. Gallbladder surgically absent. No intrahepatic or extrahepatic biliary dilation. Pancreas: No focal mass lesion. No dilatation of the main duct. No intraparenchymal cyst. No peripancreatic edema. Spleen: No splenomegaly. No focal mass lesion. Adrenals/Urinary Tract: No adrenal nodule or mass. 2.2 cm water density lesion upper pole right kidney compatible with a cyst. Left kidney unremarkable. No renal or ureteral stones. The urinary bladder appears normal for the degree of distention. Stomach/Bowel: Stomach is nondistended. No gastric wall thickening. No  evidence of outlet obstruction. Duodenum is normally positioned as is the ligament of Treitz. No small bowel wall thickening. No small bowel dilatation. The terminal ileum is normal. The appendix is normal. No gross colonic mass. No colonic wall thickening. No substantial diverticular change. Vascular/Lymphatic: There is abdominal aortic atherosclerosis without aneurysm. There is no gastrohepatic or hepatoduodenal ligament lymphadenopathy. No intraperitoneal or retroperitoneal lymphadenopathy. No pelvic sidewall lymphadenopathy. Reproductive: The uterus has normal CT imaging appearance. There is no adnexal mass. Other: No intraperitoneal free fluid. Musculoskeletal: Patient is status post left total hip replacement. Streak artifact from the prosthesis obscures portions of the left pelvic sidewall. Bone windows reveal no worrisome lytic or sclerotic osseous lesions. IMPRESSION: 1. No CT findings to explain the patient's history of left-sided flank and back pain. 2. 2.2 cm lesion upper pole right kidney compatible with a cyst. 3. Tiny right pleural effusion. 4. Abdominal aortic atherosclerosis. Electronically Signed   By: Misty Stanley M.D.   On: 06/27/2015 16:36     ASSESSMENT AND PLAN:   82 year old female with past medical history of lung cancer, COPD, diabetes type 2 without consultation, history of previous CVA, who presented to the hospital due to weakness and noted to be hypoglycemic.  #1 weakness/altered mental status-much improved today. This was due to hypoglycemia which has now improved and resolved. -We'll get physical therapy consult to assess mobility.  #2 acute renal failure-continue IV fluids and renal function is improving. -Avoid nephrotoxins, renal dose meds  #3 Hyperkalemia - due to ARF.  Stable and will cont. To monitor.  - no ECG changes.  No arrhythmia on tele.   #4 anemia/leukopenia-chronic and related to underlying malignancy. -Continue follow-up patient's hematologist  as an  outpatient.  #5 history of previous CVA-continue Aggrenox.  #6 hypertension-continue Imdur, metoprolol.  #7 hypothyroidism-continue Synthroid.  #8 hyperlipidemia-continue Crestor  #9 COPD-no acute exacerbation-continue Spiriva, pulmicort nebs. - already on O2 at home.    Possible d/c home tomorrow.   All the records are reviewed and case discussed with Care Management/Social Workerr. Management plans discussed with the patient, family and they are in agreement.  CODE STATUS: Full  DVT Prophylaxis: heparin SQ   TOTAL TIME TAKING CARE OF THIS PATIENT: 30 minutes.   POSSIBLE D/C IN 1-2 DAYS, DEPENDING ON CLINICAL CONDITION.   Henreitta Leber M.D on 06/28/2015 at 12:48 PM  Between 7am to 6pm - Pager - [phone]  After 6pm go to [email or web address] - password [email or web address] [facility]  Office  [phone]  CC: Primary care physician; Vista Mink, FNP

## 2015-06-29 LAB — CBC
HCT: 31.4 % — ABNORMAL LOW (ref 35.0–47.0)
HEMOGLOBIN: 10.1 g/dL — AB (ref 12.0–16.0)
MCH: 29.7 pg (ref 26.0–34.0)
MCHC: 32.2 g/dL (ref 32.0–36.0)
MCV: 92.2 fL (ref 80.0–100.0)
Platelets: 227 10*3/uL (ref 150–440)
RBC: 3.41 MIL/uL — AB (ref 3.80–5.20)
RDW: 15.2 % — ABNORMAL HIGH (ref 11.5–14.5)
WBC: 2.3 10*3/uL — ABNORMAL LOW (ref 3.6–11.0)

## 2015-06-29 LAB — GLUCOSE, CAPILLARY
GLUCOSE-CAPILLARY: 88 mg/dL (ref 65–99)
GLUCOSE-CAPILLARY: 117 mg/dL — AB (ref 65–99)
GLUCOSE-CAPILLARY: 88 mg/dL (ref 65–99)

## 2015-06-29 LAB — URINE CULTURE: Culture: NO GROWTH

## 2015-06-29 LAB — BASIC METABOLIC PANEL
ANION GAP: 6 (ref 5–15)
BUN: 18 mg/dL (ref 6–20)
CALCIUM: 9.1 mg/dL (ref 8.9–10.3)
CO2: 22 mmol/L (ref 22–32)
Chloride: 110 mmol/L (ref 101–111)
Creatinine, Ser: 1.38 mg/dL — ABNORMAL HIGH (ref 0.44–1.00)
GFR, EST AFRICAN AMERICAN: 39 mL/min — AB (ref >60)
GFR, EST NON AFRICAN AMERICAN: 33 mL/min — AB (ref >60)
Glucose, Bld: 100 mg/dL — ABNORMAL HIGH (ref 65–99)
Potassium: 5.9 mmol/L — ABNORMAL HIGH (ref 3.5–5.1)
Sodium: 138 mmol/L (ref 135–145)

## 2015-06-29 LAB — POTASSIUM: POTASSIUM: 5.4 mmol/L — AB (ref 3.5–5.1)

## 2015-06-29 MED ORDER — SODIUM POLYSTYRENE SULFONATE 15 GM/60ML PO SUSP
30.0000 g | Freq: Once | ORAL | Status: AC
  Administered 2015-06-29: 30 g via ORAL
  Filled 2015-06-29: qty 120

## 2015-06-29 NOTE — Care Management (Signed)
Admitted to [facility] with the diagnosis of acute kidney injury. Lives alone. [facility] [phone] or [phone]), Last seen Dr. Bernita Buffy 05/27/15. PRN home oxygen started  2004. Since 2006 constant oxygen. No skilled facility. Home Health thru [facility] in the past. Takes care of all basic activities of daily living herself, doesn't drive. No falls. Appetite on and off. Daughter will transport. Physical therapy evaluation completed. Recommends home with home health/physical therapy 24/7. Daughter will be staying with [name] in the home for several days. [place] for services in the home. Will update [name], [facility] representative. Possible discharge this afternoon per Dr. Verdell Carmine. Shelbie Ammons RN MSN CCM Care Management [phone]

## 2015-06-29 NOTE — Discharge Summary (Signed)
[facility] at [place] NAME: [name]    MR#:  [id number]  DATE OF BIRTH:  January 21, 1926  DATE OF ADMISSION:  06/27/2015 ADMITTING PHYSICIAN: Lytle Butte, MD  DATE OF DISCHARGE: 06/29/2015  PRIMARY CARE PHYSICIAN: Vista Mink, FNP    ADMISSION DIAGNOSIS:  Flank pain [R10.9] Acute renal injury ([place]) [N17.9]  DISCHARGE DIAGNOSIS:  Principal Problem:   Acute kidney injury ([place]) Active Problems:   Hypoglycemia   SECONDARY DIAGNOSIS:   Past Medical History  Diagnosis Date  . Diabetes mellitus without complication ([place])   . Hypertension   . Stroke ([place])   . COPD (chronic obstructive pulmonary disease) ([place])   . Coronary artery disease   . Cancer [facility])     lung cancer    HOSPITAL COURSE:   82 year old female with past medical history of lung cancer, COPD, diabetes type 2 without consultation, history of previous CVA, who presented to the hospital due to weakness and noted to be hypoglycemic.  #1 weakness/altered mental status-this was due to hypoglycemia. Patient was started on a dextrose drip and her blood sugars have since then improved and her mental status is back to baseline. -She was also seen by physical therapy and recommended home health services which is being arranged for her prior to discharge. Patient will be discharged with home health nursing and physical therapy.  #2 acute renal failure-this was due to dehydration, patient being on Bactrim.  Patient was hydrated with IV fluids and her diuretics were held and she was taken off the Bactrim. -Her renal function is improved and is back to baseline.  #3 Hyperkalemia - due to ARF plus pt. Being on Bactrim, Aldactone.  She has been taken off Aldactone now and also she does not have a UTI and therefore off Bactrim now.  - potassium level down to 5.4 from 5.9 at discharge.  She had no ECG changes consistent w/ hyperkalemia and she had No arrhythmia on tele.    #4 anemia/leukopenia-chronic and related to underlying malignancy ([facility]) -Patient follows up with an oncologist in [place] and will continue follow-up there.  #5 history of previous CVA-she will continue Aggrenox.  #6 hypertension- she will continue Imdur, metoprolol.  #7 hypothyroidism- she will continue Synthroid.  #8 hyperlipidemia- she will continue Crestor  #9 COPD-no acute exacerbation-she will continue Spiriva, Flovent inhalers. She had no acute exacerbation while in the hospital.  - already on O2 at home.   #10 Diabetes Type II without complication - patient presented with hypoglycemia in the setting of being on multiple oral conditions and being in renal failure. She was placed on just sliding scale insulin while in the hospital. -Her blood sugars have improved. At this point given her renal failure she has been taken off the glipizide and Actos and will continue just [place].  Further changes to her DM meds can be done through her PCP.    DISCHARGE CONDITIONS:   Stable  CONSULTS OBTAINED:  Treatment Team:  Lytle Butte, MD  DRUG ALLERGIES:   Allergies  Allergen Reactions  . Darvon [Propoxyphene] Other (See Comments)    Patient states she was real weak, felt like she was going to pass out.  . Albuterol Palpitations and Other (See Comments)    Irregular heart beat, CAN TOLERATE XOPENEX  . Fentanyl Nausea And Vomiting and Rash  . Latex Rash  . Tetracyclines & Related Rash    DISCHARGE MEDICATIONS:   Current Discharge Medication List  CONTINUE these medications which have NOT CHANGED   Details  amLODipine-benazepril (LOTREL) 10-20 MG capsule Take 1 capsule by mouth every evening.    Butalbital-APAP-Caffeine 50-325-40 MG capsule Take 1 capsule by mouth 4 (four) times daily as needed. For migraines.    Choline Fenofibrate (FENOFIBRIC ACID) 135 MG CPDR Take 135 mg by mouth daily.    DEXILANT 60 MG capsule Take 60 mg by mouth daily.     dipyridamole-aspirin (AGGRENOX) 200-25 MG 12hr capsule Take 1 capsule by mouth 2 (two) times daily.    Fe Fum-FePoly-FA-Vit C-Vit B3 (INTEGRA F) 125-1 MG CAPS Take 1 capsule by mouth daily.    fluticasone (FLOVENT DISKUS) 50 MCG/BLIST diskus inhaler Inhale 1-2 puffs into the lungs daily as needed. For rhinitis.    gabapentin (NEURONTIN) 100 MG capsule Take 100 mg by mouth 2 (two) times daily.    isosorbide mononitrate (IMDUR) 60 MG 24 hr tablet Take 60 mg by mouth 2 (two) times daily.    JANUVIA 100 MG tablet Take 100 mg by mouth every evening.    levalbuterol (XOPENEX HFA) 45 MCG/ACT inhaler Inhale 1 puff into the lungs 4 (four) times daily as needed. For wheezing/shortness of breath.    levalbuterol (XOPENEX) 1.25 MG/3ML nebulizer solution Take 1.25 mg by nebulization every 4 (four) hours as needed for wheezing or shortness of breath.    levothyroxine (SYNTHROID, LEVOTHROID) 125 MCG tablet Take 125 mcg by mouth daily before breakfast.    meclizine (ANTIVERT) 25 MG tablet Take 25 mg by mouth 2 (two) times daily.    meloxicam (MOBIC) 7.5 MG tablet Take 7.5 mg by mouth every other day.    metoprolol succinate (TOPROL-XL) 50 MG 24 hr tablet Take 25 mg by mouth 2 (two) times daily.    montelukast (SINGULAIR) 10 MG tablet Take 10 mg by mouth daily.    nitroGLYCERIN (NITROSTAT) 0.4 MG SL tablet Place 0.4 mg under the tongue every 5 (five) minutes x 3 doses as needed. For chest pain. *if no relief call MD or go to emergency room*    rosuvastatin (CRESTOR) 20 MG tablet Take 10 mg by mouth daily.    SPIRIVA HANDIHALER 18 MCG inhalation capsule Place 18 mcg into inhaler and inhale daily.    torsemide (DEMADEX) 20 MG tablet Take 10-20 mg by mouth daily as needed. For edema/fluid.      STOP taking these medications     glipiZIDE (GLUCOTROL XL) 10 MG 24 hr tablet      pioglitazone (ACTOS) 45 MG tablet      spironolactone (ALDACTONE) 25 MG tablet      sulfamethoxazole-trimethoprim  (BACTRIM DS,SEPTRA DS) 800-160 MG tablet          DISCHARGE INSTRUCTIONS:   DIET:  Cardiac diet and Diabetic diet  DISCHARGE CONDITION:  Stable  ACTIVITY:  Activity as tolerated  OXYGEN:  Home Oxygen: Yes.     Oxygen Delivery: 2 liters/min via Patient connected to nasal cannula oxygen  DISCHARGE LOCATION:  Home with home health nursing and physical therapy.   If you experience worsening of your admission symptoms, develop shortness of breath, life threatening emergency, suicidal or homicidal thoughts you must seek medical attention immediately by calling 911 or calling your MD immediately  if symptoms less severe.  You Must read complete instructions/literature along with all the possible adverse reactions/side effects for all the Medicines you take and that have been prescribed to you. Take any new Medicines after you have completely understood and accpet all the  possible adverse reactions/side effects.   Please note  You were cared for by a hospitalist during your hospital stay. If you have any questions about your discharge medications or the care you received while you were in the hospital after you are discharged, you can call the unit and asked to speak with the hospitalist on call if the hospitalist that took care of you is not available. Once you are discharged, your primary care physician will handle any further medical issues. Please note that NO REFILLS for any discharge medications will be authorized once you are discharged, as it is imperative that you return to your primary care physician (or establish a relationship with a primary care physician if you do not have one) for your aftercare needs so that they can reassess your need for medications and monitor your lab values.     Today   Shortness of breath improved. Renal function stable. Potassium level has come down.  VITAL SIGNS:  Blood pressure 146/46, pulse 75, temperature 98.5 F (36.9 C), temperature  source Oral, resp. rate 16, height '5\' 5"'$  (1.651 m), weight 90.719 kg (200 lb), SpO2 100 %.  I/O:   Intake/Output Summary (Last 24 hours) at 06/29/15 1609 Last data filed at 06/29/15 1200  Gross per 24 hour  Intake 1099.17 ml  Output    400 ml  Net 699.17 ml    PHYSICAL EXAMINATION:  GENERAL:  82 y.o.-year-old patient lying in the bed with no acute distress.  EYES: Pupils equal, round, reactive to light and accommodation. No scleral icterus. Extraocular muscles intact.  HEENT: Head atraumatic, normocephalic. Oropharynx and nasopharynx clear.  NECK:  Supple, no jugular venous distention. No thyroid enlargement, no tenderness.  LUNGS: Normal breath sounds bilaterally, no wheezing, rales,rhonchi. No use of accessory muscles of respiration.  CARDIOVASCULAR: S1, S2 normal. No murmurs, rubs, or gallops.  ABDOMEN: Soft, non-tender, non-distended. Bowel sounds present. No organomegaly or mass.  EXTREMITIES: No pedal edema, cyanosis, or clubbing.  NEUROLOGIC: Cranial nerves II through XII are intact. No focal motor or sensory defecits b/l.  PSYCHIATRIC: The patient is alert and oriented x 3. Good affect.  SKIN: No obvious rash, lesion, or ulcer.   DATA REVIEW:   CBC  Recent Labs Lab 06/29/15 0528  WBC 2.3*  HGB 10.1*  HCT 31.4*  PLT 227    Chemistries   Recent Labs Lab 06/27/15 1447  06/29/15 0528 06/29/15 1407  NA 136  < > 138  --   K 5.6*  < > 5.9* 5.4*  CL 104  < > 110  --   CO2 25  < > 22  --   GLUCOSE 81  < > 100*  --   BUN 32*  < > 18  --   CREATININE 2.38*  < > 1.38*  --   CALCIUM 9.0  < > 9.1  --   AST 21  --   --   --   ALT 11*  --   --   --   ALKPHOS 33*  --   --   --   BILITOT <0.1*  --   --   --   < > = values in this interval not displayed.  Cardiac Enzymes No results for input(s): TROPONINI in the last 168 hours.  Microbiology Results  Results for orders placed or performed during the hospital encounter of 06/27/15  Urine culture     Status: None    Collection Time: 06/27/15  2:47 PM  Result  Value Ref Range Status   Specimen Description URINE, RANDOM  Final   Special Requests NONE  Final   Culture NO GROWTH 1 DAY  Final   Report Status 06/29/2015 FINAL  Final    RADIOLOGY:  Ct Head Wo Contrast  06/27/2015  CLINICAL DATA:  Left facial and body weakness and numbness since this morning. Previous stroke. Lung carcinoma. EXAM: CT HEAD WITHOUT CONTRAST TECHNIQUE: Contiguous axial images were obtained from the base of the skull through the vertex without intravenous contrast. COMPARISON:  12/10/2013 FINDINGS: There is no evidence of intracranial hemorrhage, brain edema, or other signs of acute infarction. There is no evidence of intracranial mass lesion or mass effect. No abnormal extraaxial fluid collections are identified. Mild to moderate diffuse cerebral and cerebellar atrophy remains stable. No evidence of hydrocephalus. No skull fracture or other bone lesions identified. IMPRESSION: No acute intracranial abnormality. Stable diffuse cerebral and cerebellar atrophy. Electronically Signed   By: Earle Gell M.D.   On: 06/27/2015 16:31   US Renal  06/28/2015  CLINICAL DATA:  Acute kidney injury. EXAM: RENAL / URINARY TRACT ULTRASOUND COMPLETE COMPARISON:  CT 06/27/2015 FINDINGS: Right Kidney: Length: 11.7 cm. 2.2 cm cyst in the upper pole. Normal echotexture. No hydronephrosis. Left Kidney: Length: 11.4 cm. 1.9 cm parapelvic cyst. Normal echotexture. No hydronephrosis. Bladder: Appears normal for degree of bladder distention. IMPRESSION: Small bilateral renal cysts.  No acute findings.  No hydronephrosis. Electronically Signed   By: Rolm Baptise M.D.   On: 06/28/2015 08:09   Ct Renal Stone Study  06/27/2015  CLINICAL DATA:  Left-sided flank and back pain starting last night. EXAM: CT ABDOMEN AND PELVIS WITHOUT CONTRAST TECHNIQUE: Multidetector CT imaging of the abdomen and pelvis was performed following the standard protocol without IV contrast.  COMPARISON:  PET-CT from 06/05/2015. Abdomen and pelvis CT from 06/25/2009. FINDINGS: Lower chest:  Basilar atelectasis with tiny right pleural effusion. Hepatobiliary: No focal abnormality in the liver on this study without intravenous contrast. No evidence of hepatomegaly. Gallbladder surgically absent. No intrahepatic or extrahepatic biliary dilation. Pancreas: No focal mass lesion. No dilatation of the main duct. No intraparenchymal cyst. No peripancreatic edema. Spleen: No splenomegaly. No focal mass lesion. Adrenals/Urinary Tract: No adrenal nodule or mass. 2.2 cm water density lesion upper pole right kidney compatible with a cyst. Left kidney unremarkable. No renal or ureteral stones. The urinary bladder appears normal for the degree of distention. Stomach/Bowel: Stomach is nondistended. No gastric wall thickening. No evidence of outlet obstruction. Duodenum is normally positioned as is the ligament of Treitz. No small bowel wall thickening. No small bowel dilatation. The terminal ileum is normal. The appendix is normal. No gross colonic mass. No colonic wall thickening. No substantial diverticular change. Vascular/Lymphatic: There is abdominal aortic atherosclerosis without aneurysm. There is no gastrohepatic or hepatoduodenal ligament lymphadenopathy. No intraperitoneal or retroperitoneal lymphadenopathy. No pelvic sidewall lymphadenopathy. Reproductive: The uterus has normal CT imaging appearance. There is no adnexal mass. Other: No intraperitoneal free fluid. Musculoskeletal: Patient is status post left total hip replacement. Streak artifact from the prosthesis obscures portions of the left pelvic sidewall. Bone windows reveal no worrisome lytic or sclerotic osseous lesions. IMPRESSION: 1. No CT findings to explain the patient's history of left-sided flank and back pain. 2. 2.2 cm lesion upper pole right kidney compatible with a cyst. 3. Tiny right pleural effusion. 4. Abdominal aortic atherosclerosis.  Electronically Signed   By: Misty Stanley M.D.   On: 06/27/2015 16:36  Management plans discussed with the patient, family and they are in agreement.  CODE STATUS:     Code Status Orders        Start     Ordered   06/27/15 1731  Full code   Continuous     06/27/15 1731    Code Status History    Date Active Date Inactive Code Status Order ID Comments User Context   This patient has a current code status but no historical code status.    Advance Directive Documentation        Most Recent Value   Type of Advance Directive  Healthcare Power of Attorney   Pre-existing out of facility DNR order (yellow form or pink MOST form)     "MOST" Form in Place?        TOTAL TIME TAKING CARE OF THIS PATIENT: 40 minutes.    Henreitta Leber M.D on 06/29/2015 at 4:09 PM  Between 7am to 6pm - Pager - [phone]  After 6pm go to [email or web address] - password [email or web address] [facility]  Office  [phone]  CC: Primary care physician; Vista Mink, FNP

## 2015-06-29 NOTE — Evaluation (Signed)
Physical Therapy Evaluation Patient Details Name: [name] MRN: [id number] DOB: 08-Aug-1925 Today's Date: 06/29/2015   History of Present Illness  Pt is admitted for acute renal failure. Pt with orginal complaints of low blood sugar. Pt with 5.9K level, however per discussion with RN, pt is safe to work with therapy per MD as he thought the lab was inaccurate. Pt with history of DM, HTN, CVA, COPD, CAD and lung cancer.   Clinical Impression  Pt is a pleasant 82 year old female who was admitted for ARF. Pt performs bed mobility independently, transfers with mod I, and ambulation with cga and rw. O2 sats remain at 99% while on 3L of O2 with exertion. Pt fatigues with limited activity and is slightly unsteady during mobility, needing cga at all times. Pt demonstrates deficits with endurance/balance. Pt understands limitations and uses AD at all times while at home. Would benefit from skilled PT to address above deficits and promote optimal return to PLOF. Recommend transition to [facility] upon discharge from acute hospitalization.      Follow Up Recommendations Home health PT;Supervision for mobility/OOB (pt said family could stay for a few days.Marland Kitchen)    Equipment Recommendations       Recommendations for Other Services       Precautions / Restrictions Precautions Precautions: Fall Restrictions Weight Bearing Restrictions: No      Mobility  Bed Mobility Overal bed mobility: Independent             General bed mobility comments: safe technique performed  Transfers Overall transfer level: Modified independent Equipment used: Rolling walker (2 wheeled)             General transfer comment: safe technique performed. RW used and all mobility performed while on 3L of O2.  Ambulation/Gait Ambulation/Gait assistance: Min guard Ambulation Distance (Feet): 80 Feet Assistive device: Rolling walker (2 wheeled) Gait Pattern/deviations: Step-to pattern     General Gait Details:  ambulated with step to gait pattern with 1 standing rest break against wall. All mobility performed on 3L of O2 with sats WNL. Pt fatigues quickly with endurance and has slight unsteadiness noted during ambulation, no formal LOB.   Stairs            Wheelchair Mobility    Modified Rankin (Stroke Patients Only)       Balance Overall balance assessment: Needs assistance Sitting-balance support: Feet supported Sitting balance-Leahy Scale: Normal     Standing balance support: Bilateral upper extremity supported Standing balance-Leahy Scale: Fair                               Pertinent Vitals/Pain Pain Assessment: No/denies pain    Home Living Family/patient expects to be discharged to:: Private residence Living Arrangements: Alone Available Help at Discharge: Family Type of Home: House Home Access: Level entry     Home Layout: One level Home Equipment: Environmental consultant - 2 wheels;Walker - 4 wheels;Cane - single point;Wheelchair - manual      Prior Function Level of Independence: Independent with assistive device(s)         Comments: primarily using rollater for mobility     Hand Dominance        Extremity/Trunk Assessment   Upper Extremity Assessment: Generalized weakness (grossly 3+/5)           Lower Extremity Assessment: Generalized weakness (grossly 4/5)         Communication   Communication: No  difficulties  Cognition Arousal/Alertness: Awake/alert Behavior During Therapy: WFL for tasks assessed/performed Overall Cognitive Status: Within Functional Limits for tasks assessed                      General Comments      Exercises Other Exercises Other Exercises: supine ther-ex performed x 10 reps on B LE including quad sets, SLRs, SAQ, and heel slides. Exercises performed x cga      Assessment/Plan    PT Assessment Patient needs continued PT services  PT Diagnosis Difficulty walking;Abnormality of gait;Generalized  weakness   PT Problem List Decreased strength;Decreased activity tolerance;Decreased balance;Decreased mobility  PT Treatment Interventions Gait training;Therapeutic exercise;Balance training;DME instruction   PT Goals (Current goals can be found in the Care Plan section) Acute Rehab PT Goals Patient Stated Goal: to go home PT Goal Formulation: With patient Time For Goal Achievement: 07/13/15 Potential to Achieve Goals: Good    Frequency Min 2X/week   Barriers to discharge Decreased caregiver support      Co-evaluation               End of Session Equipment Utilized During Treatment: Gait belt;Oxygen Activity Tolerance: Patient limited by fatigue Patient left: in bed;with bed alarm set Nurse Communication: Mobility status         Time: 1020-1049 PT Time Calculation (min) (ACUTE ONLY): 29 min   Charges:   PT Evaluation $PT Eval Moderate Complexity: 1 Procedure PT Treatments $Therapeutic Exercise: 8-22 mins   PT G Codes:        Farida Mcreynolds 2015/07/05, 11:38 AM  Greggory Stallion, PT, DPT [phone]

## 2015-06-29 NOTE — Progress Notes (Signed)
Discussed discharge instruction and medications with pt and her daughter. Made aware of follow up appointment.  Made pt aware of medications that Dr. Verdell Carmine did not want her to take.  Pt's IV removed.  Pt transported home via car by daughter.  Clarise Cruz, RN

## 2015-06-29 NOTE — Care Management Important Message (Signed)
Important Message  Patient Details  Name: [name] MRN: [id number] Date of Birth: [birth year]   Medicare Important Message Given:  Yes    Juliann Pulse A Adarian Bur 06/29/2015, 1:33 PM

## 2015-06-29 NOTE — Progress Notes (Signed)
Initial Nutrition Assessment    INTERVENTION:  Meals and snacks: Cater to pt preferences Medical Nutrition Supplement Therapy: If unable to meet nutritional needs will add supplement   NUTRITION DIAGNOSIS:   Inadequate oral intake related to altered GI function as evidenced by per patient/family report.    GOAL:   Patient will meet greater than or equal to 90% of their needs    MONITOR:    (Energy intake, Digestive system, Electrolyte and renal profile)  REASON FOR ASSESSMENT:   Diagnosis    ASSESSMENT:      Pt admitted with ARF, weakness, hyperglycemia, AMS  Past Medical History  Diagnosis Date  . Diabetes mellitus without complication ([place])   . Hypertension   . Stroke ([place])   . COPD (chronic obstructive pulmonary disease) ([place])   . Coronary artery disease   . Cancer ([place])     lung cancer    Current Nutrition: eating 55-70% of meals per I and O sheet  Food/Nutrition-Related History: pt reports intake up and down prior to admission.  Has problems with esophagitis from radiation that makes it difficult to eat at times.   Scheduled Medications:  . budesonide (PULMICORT) nebulizer solution  0.25 mg Nebulization BID  . dipyridamole-aspirin  1 capsule Oral BID  . gabapentin  100 mg Oral QHS  . heparin  5,000 Units Subcutaneous 3 times per day  . insulin aspart  0-5 Units Subcutaneous QHS  . insulin aspart  0-9 Units Subcutaneous TID WC  . isosorbide mononitrate  60 mg Oral BID  . levothyroxine  150 mcg Oral QAC breakfast  . meloxicam  7.5 mg Oral Once per day on Mon Wed Sat  . metoprolol succinate  50 mg Oral Daily  . montelukast  10 mg Oral QHS  . pantoprazole  40 mg Oral Daily  . phenylephrine-shark liver oil-mineral oil-petrolatum   Rectal BID  . rosuvastatin  20 mg Oral q1800  . sodium chloride flush  3 mL Intravenous Q12H  . tiotropium  18 mcg Inhalation Daily    Continuous Medications:  . sodium chloride 50 mL/hr at 06/28/15 1749      Electrolyte/Renal Profile and Glucose Profile:   Recent Labs Lab 06/27/15 1447 06/28/15 0551 06/29/15 0528  NA 136 139 138  K 5.6* 5.6* 5.9*  CL 104 111 110  CO2 '25 25 22  '$ BUN 32* 22* 18  CREATININE 2.38* 1.60* 1.38*  CALCIUM 9.0 8.7* 9.1  GLUCOSE 81 97 100*   Protein Profile:  Recent Labs Lab 06/27/15 1447  ALBUMIN 3.9    Gastrointestinal Profile: Last BM: 1/30   Nutrition-Focused Physical Exam Findings: deferred at this time   Weight Change: wt gain from fluid per pt    Diet Order:  Diet Heart Room service appropriate?: Yes; Fluid consistency:: Thin  Skin:   reviewed     Height:   Ht Readings from Last 1 Encounters:  06/27/15 '5\' 5"'$  (1.651 m)    Weight:   Wt Readings from Last 1 Encounters:  06/27/15 200 lb (90.719 kg)    Ideal Body Weight:     BMI:  Body mass index is 33.28 kg/(m^2).   EDUCATION NEEDS:   No education needs identified at this time  [place]. Zenia Resides, [place], [place] (pager) Weekend/On-Call pager [phone])

## 2015-07-09 ENCOUNTER — Encounter (HOSPITAL_COMMUNITY): Payer: Self-pay

## 2015-10-15 ENCOUNTER — Other Ambulatory Visit: Payer: Self-pay | Admitting: Internal Medicine

## 2015-10-15 DIAGNOSIS — C3491 Malignant neoplasm of unspecified part of right bronchus or lung: Secondary | ICD-10-CM

## 2015-10-27 ENCOUNTER — Ambulatory Visit (INDEPENDENT_AMBULATORY_CARE_PROVIDER_SITE_OTHER): Payer: Medicare Other | Admitting: Urology

## 2015-10-27 ENCOUNTER — Encounter: Payer: Self-pay | Admitting: Urology

## 2015-10-27 VITALS — BP 130/71 | HR 69 | Ht 66.0 in | Wt 195.6 lb

## 2015-10-27 DIAGNOSIS — N390 Urinary tract infection, site not specified: Secondary | ICD-10-CM | POA: Diagnosis not present

## 2015-10-27 DIAGNOSIS — N952 Postmenopausal atrophic vaginitis: Secondary | ICD-10-CM

## 2015-10-27 DIAGNOSIS — N189 Chronic kidney disease, unspecified: Secondary | ICD-10-CM

## 2015-10-27 DIAGNOSIS — E0822 Diabetes mellitus due to underlying condition with diabetic chronic kidney disease: Secondary | ICD-10-CM | POA: Diagnosis not present

## 2015-10-27 MED ORDER — ESTRADIOL 0.1 MG/GM VA CREA: TOPICAL_CREAM | VAGINAL | Status: DC

## 2015-10-27 NOTE — Progress Notes (Signed)
In and Out Catheterization  Patient is present today for a I & O catheterization due to chronic uti. Patient was cleaned and prepped in a sterile fashion with betadine and Lidocaine 2% jelly was instilled into the urethra.  A 14FR cath was inserted no complications were noted , 24m of urine return was noted, urine was yellow in color. A clean urine sample was collected for clean catch urinalysis. Bladder was drained and catheter was removed with out difficulty.    Preformed by: RLyndee HensenCMA

## 2015-10-27 NOTE — Progress Notes (Signed)
10/27/2015 5:00 PM   [name] 1926-03-11 [id number]  Referring provider: Vista Mink, [place] [address] [place], [place] [address]  Chief Complaint  Patient presents with  . Recurrent UTI    Referred by Dr. Lavena Bullion    HPI: Patient is an 82 year old Caucasian female who is referred by her PCP, Vista Mink, FNP, for recurrent UTI.    Patient presents today with her daughter, [name], stating that she has had an ongoing UTI since January 2017.  She was admitted to the hospital for hypoglycemia after being placed on Bactrim for an UTI by her PCP.     She states that her symptoms for urinary tract infection are left-sided flank pain, right sided pain and pain with urination.  I do not have a documented infection available to me at this time.   A urine culture performed during her hospitalization in January was negative. A urine culture performed at her primary care's office on 07/21/2015 noted no growth. She is having base line symptoms of frequency, urgency, dysuria, nocturia, incontinence, intermittency, hesitancy, straining to urinate and a weak urinary stream.  She has not had gross hematuria or suprapubic pain.  She denies any fevers, chills, nausea or vomiting.  She states that she has been told that she has bilateral nephrolithiasis on a scan done at her PCP's office, but a CT renal stone study and a renal ultrasound performed in January 2017 did not identify any kidney, ureteral or bladder stones.  She was complaining of the left flank pain and left side pain at the time these imaging studies were performed.  Her UA today was unremarkable.  This was a CATH UA.    PMH: Past Medical History  Diagnosis Date  . Diabetes mellitus without complication ([place])   . Hypertension   . Stroke ([place])   . COPD (chronic obstructive pulmonary disease) ([place])   . Coronary artery disease   . Cancer ([place])     lung cancer  . Heartburn   . Arrhythmia   . Arthritis   . Asthma    . Atrial fibrillation ([place])   . Depression   . Heart attack ([place])   . Heart failure ([place])   . Heart disease   . HLD (hyperlipidemia)   . Hyperthyroidism   . Kidney failure   . Sleep apnea     Surgical History: Past Surgical History  Procedure Laterality Date  . Total hip arthroplasty Left 2008  . Eye surgery  2012/2016  . Cholecystectomy  1983  . Thyroid surgery  1963    Home Medications:    Medication List       This list is accurate as of: 10/27/15 11:59 PM.  Always use your most recent med list.               AGGRENOX 200-25 MG 12hr capsule  Generic drug:  dipyridamole-aspirin  Take 1 capsule by mouth 2 (two) times daily.     amLODipine-benazepril 10-20 MG capsule  Commonly known as:  LOTREL  Take 1 capsule by mouth every evening.     ARANESP (ALBUMIN FREE) 300 MCG/0.6ML Sosy injection  Generic drug:  Darbepoetin Alfa     Butalbital-APAP-Caffeine 50-325-40 MG capsule  Take 1 capsule by mouth 4 (four) times daily as needed. Reported on 10/27/2015     DEXILANT 60 MG capsule  Generic drug:  dexlansoprazole  Take 60 mg by mouth daily.     estradiol 0.1 MG/GM  vaginal cream  Commonly known as:  ESTRACE VAGINAL  Apply 0.'5mg'$  (pea-sized amount)  just inside the vaginal introitus with a finger-tip every night for two weeks and then Monday, Wednesday and Friday nights.     Fenofibric Acid 135 MG Cpdr  Take 135 mg by mouth daily. Reported on 10/27/2015     fluticasone 50 MCG/BLIST diskus inhaler  Commonly known as:  FLOVENT DISKUS  Inhale 1-2 puffs into the lungs daily as needed. For rhinitis.     gabapentin 100 MG capsule  Commonly known as:  NEURONTIN  Take 100 mg by mouth 2 (two) times daily.     INTEGRA F 125-1 MG Caps  Take 1 capsule by mouth daily.     isosorbide mononitrate 60 MG 24 hr tablet  Commonly known as:  IMDUR  Take 60 mg by mouth 2 (two) times daily.     JANUVIA 100 MG tablet  Generic drug:  sitaGLIPtin  Take 100 mg by mouth every  evening.     levalbuterol 1.25 MG/3ML nebulizer solution  Commonly known as:  XOPENEX  Take 1.25 mg by nebulization every 4 (four) hours as needed for wheezing or shortness of breath.     levalbuterol 45 MCG/ACT inhaler  Commonly known as:  XOPENEX HFA  Inhale 1 puff into the lungs 4 (four) times daily as needed. For wheezing/shortness of breath.     levothyroxine 125 MCG tablet  Commonly known as:  SYNTHROID, LEVOTHROID  Take 125 mcg by mouth daily before breakfast.     meclizine 25 MG tablet  Commonly known as:  ANTIVERT  Take 25 mg by mouth 2 (two) times daily.     meloxicam 7.5 MG tablet  Commonly known as:  MOBIC  Take 7.5 mg by mouth every other day.     metoprolol succinate 50 MG 24 hr tablet  Commonly known as:  TOPROL-XL  Take 25 mg by mouth 2 (two) times daily.     montelukast 10 MG tablet  Commonly known as:  SINGULAIR  Take 10 mg by mouth daily.     nitroGLYCERIN 0.4 MG SL tablet  Commonly known as:  NITROSTAT  Place 0.4 mg under the tongue every 5 (five) minutes x 3 doses as needed. For chest pain. *if no relief call MD or go to emergency room*     OXYGEN  Inhale into the lungs.     rosuvastatin 20 MG tablet  Commonly known as:  CRESTOR  Take 10 mg by mouth daily.     SPIRIVA HANDIHALER 18 MCG inhalation capsule  Generic drug:  tiotropium  Place 18 mcg into inhaler and inhale daily.     torsemide 20 MG tablet  Commonly known as:  DEMADEX  Take 10-20 mg by mouth daily as needed. For edema/fluid.     traMADol-acetaminophen 37.5-325 MG tablet  Commonly known as:  ULTRACET     WELCHOL 625 MG tablet  Generic drug:  colesevelam        Allergies:  Allergies  Allergen Reactions  . Bactrim [Sulfamethoxazole-Trimethoprim]   . Darvon [Propoxyphene] Other (See Comments)    Patient states she was real weak, felt like she was going to pass out.  . Albuterol Palpitations and Other (See Comments)    Irregular heart beat, CAN TOLERATE XOPENEX  . Fentanyl  Nausea And Vomiting and Rash  . Latex Rash  . Tetracyclines & Related Rash    Family History: Family History  Problem Relation Age of Onset  . Diabetes  Other   . Hypertension Other   . Hematuria Father   . Kidney cancer Brother   . Kidney failure Mother   . Kidney failure Father   . Kidney Stones Brother     Social History:  reports that she has never smoked. She has never used smokeless tobacco. She reports that she does not drink alcohol or use illicit drugs.  ROS: UROLOGY Frequent Urination?: Yes Hard to postpone urination?: Yes Burning/pain with urination?: Yes Get up at night to urinate?: Yes Leakage of urine?: Yes Urine stream starts and stops?: Yes Trouble starting stream?: Yes Do you have to strain to urinate?: Yes Blood in urine?: No Urinary tract infection?: Yes Sexually transmitted disease?: No Injury to kidneys or bladder?: No Painful intercourse?: No Weak stream?: Yes Currently pregnant?: No Vaginal bleeding?: No Last menstrual period?: n  Gastrointestinal Nausea?: Yes Vomiting?: No Indigestion/heartburn?: Yes Diarrhea?: No Constipation?: Yes  Constitutional Fever: Yes Night sweats?: No Weight loss?: No Fatigue?: Yes  Skin Skin rash/lesions?: Yes Itching?: Yes  Eyes Blurred vision?: No Double vision?: No  Ears/Nose/Throat Sore throat?: No Sinus problems?: Yes  Hematologic/Lymphatic Swollen glands?: Yes Easy bruising?: No  Cardiovascular Leg swelling?: Yes Chest pain?: Yes  Respiratory Cough?: Yes Shortness of breath?: Yes  Endocrine Excessive thirst?: Yes  Musculoskeletal Back pain?: Yes Joint pain?: Yes  Neurological Headaches?: Yes Dizziness?: Yes  Psychologic Depression?: No Anxiety?: No  Physical Exam: BP 130/71 mmHg  Pulse 69  Ht '5\' 6"'$  (1.676 m)  Wt 195 lb 9.6 oz (88.724 kg)  BMI 31.59 kg/m2  Constitutional: Well nourished. Alert and oriented, No acute distress. HEENT: [place] AT, moist mucus membranes.  Trachea midline, no masses. Cardiovascular: No clubbing, cyanosis, or edema. Respiratory: Normal respiratory effort, no increased work of breathing. GI: Abdomen is soft, non tender, non distended, no abdominal masses. Liver and spleen not palpable.  No hernias appreciated.  Stool sample for occult testing is not indicated.   GU: No CVA tenderness.  No bladder fullness or masses.  Atrophic and tender external genitalia, normal pubic hair distribution, no lesions.  Normal urethral meatus, no lesions, no prolapse, no discharge.   No urethral masses, tenderness and/or tenderness. No bladder fullness, tenderness or masses. Normal vagina mucosa, good estrogen effect, no discharge, no lesions, good pelvic support, no cystocele or rectocele noted.  No cervical motion tenderness.  Uterus is freely mobile and non-fixed.  No adnexal/parametria masses or tenderness noted.  Anus and perineum are without rashes or lesions.    Skin: No rashes, bruises or suspicious lesions. Lymph: No cervical or inguinal adenopathy. Neurologic: Grossly intact, no focal deficits, moving all 4 extremities. Psychiatric: Normal mood and affect.  Laboratory Data: Lab Results  Component Value Date   WBC 2.3* 06/29/2015   HGB 10.1* 06/29/2015   HCT 31.4* 06/29/2015   MCV 92.2 06/29/2015   PLT 227 06/29/2015    Lab Results  Component Value Date   CREATININE 1.22* 10/27/2015     Lab Results  Component Value Date   HGBA1C 6.6* 10/27/2015        Component Value Date/Time   CHOL 168 12/11/2013 0507   CHOL  07/24/2008 0910    126        ATP III CLASSIFICATION:  <200     mg/dL   Desirable  200-239  mg/dL   Borderline High  >=240    mg/dL   High          HDL 35* 12/11/2013 0507   HDL 31*  07/24/2008 0910   CHOLHDL 4.1 07/24/2008 0910   VLDL 58* 12/11/2013 0507   VLDL 35 07/24/2008 0910   LDLCALC 75 12/11/2013 0507   LDLCALC  07/24/2008 0910    60        Total Cholesterol/HDL:CHD Risk Coronary Heart Disease Risk  Table                     Men   Women  1/2 Average Risk   3.4   3.3  Average Risk       5.0   4.4  2 X Average Risk   9.6   7.1  3 X Average Risk  23.4   11.0        Use the calculated Patient Ratio above and the CHD Risk Table to determine the patient's CHD Risk.        ATP III CLASSIFICATION (LDL):  <100     mg/dL   Optimal  153-785  mg/dL   Near or Above                    Optimal  130-159  mg/dL   Borderline  092-692  mg/dL   High  >839     mg/dL   Very High    Lab Results  Component Value Date   AST 21 06/27/2015   Lab Results  Component Value Date   ALT 11* 06/27/2015    Urinalysis Results for orders placed or performed in visit on 10/27/15  Microscopic Examination  Result Value Ref Range   WBC, UA 0-5 0 -  5 /hpf   RBC, UA None seen 0 -  2 /hpf   Epithelial Cells (non renal) >10 (A) 0 - 10 /hpf   Crystals Present (A) N/A   Crystal Type Amorphous Sediment N/A   Bacteria, UA None seen None seen/Few  Urinalysis, Complete  Result Value Ref Range   Specific Gravity, UA 1.015 1.005 - 1.030   pH, UA 5.0 5.0 - 7.5   Color, UA Yellow Yellow   Appearance Ur Cloudy (A) Clear   Leukocytes, UA Negative Negative   Protein, UA Negative Negative/Trace   Glucose, UA Negative Negative   Ketones, UA Negative Negative   RBC, UA Negative Negative   Bilirubin, UA Negative Negative   Urobilinogen, Ur 0.2 0.2 - 1.0 mg/dL   Nitrite, UA Negative Negative   Microscopic Examination See below:   Basic metabolic panel  Result Value Ref Range   Glucose 111 (H) 65 - 99 mg/dL   BUN 25 8 - 27 mg/dL   Creatinine, Ser 6.91 (H) 0.57 - 1.00 mg/dL   GFR calc non Af Amer 40 (L) >59 mL/min/1.73   GFR calc Af Amer 46 (L) >59 mL/min/1.73   BUN/Creatinine Ratio 20 12 - 28   Sodium 136 134 - 144 mmol/L   Potassium 5.7 (H) 3.5 - 5.2 mmol/L   Chloride 97 96 - 106 mmol/L   CO2 19 18 - 29 mmol/L   Calcium 9.2 8.7 - 10.3 mg/dL  Hemoglobin F6L  Result Value Ref Range   Hgb A1c MFr Bld 6.6  (H) 4.8 - 5.6 %   Est. average glucose Bld gHb Est-mCnc 143 mg/dL    Pertinent Imaging: CLINICAL DATA: Left-sided flank and back pain starting last night.  EXAM: CT ABDOMEN AND PELVIS WITHOUT CONTRAST  TECHNIQUE: Multidetector CT imaging of the abdomen and pelvis was performed following the standard protocol without IV contrast.  COMPARISON: PET-CT  from 06/05/2015. Abdomen and pelvis CT from 06/25/2009.  FINDINGS: Lower chest: Basilar atelectasis with tiny right pleural effusion.  Hepatobiliary: No focal abnormality in the liver on this study without intravenous contrast. No evidence of hepatomegaly. Gallbladder surgically absent. No intrahepatic or extrahepatic biliary dilation.  Pancreas: No focal mass lesion. No dilatation of the main duct. No intraparenchymal cyst. No peripancreatic edema.  Spleen: No splenomegaly. No focal mass lesion.  Adrenals/Urinary Tract: No adrenal nodule or mass. 2.2 cm water density lesion upper pole right kidney compatible with a cyst. Left kidney unremarkable. No renal or ureteral stones. The urinary bladder appears normal for the degree of distention.  Stomach/Bowel: Stomach is nondistended. No gastric wall thickening. No evidence of outlet obstruction. Duodenum is normally positioned as is the ligament of Treitz. No small bowel wall thickening. No small bowel dilatation. The terminal ileum is normal. The appendix is normal. No gross colonic mass. No colonic wall thickening. No substantial diverticular change.  Vascular/Lymphatic: There is abdominal aortic atherosclerosis without aneurysm. There is no gastrohepatic or hepatoduodenal ligament lymphadenopathy. No intraperitoneal or retroperitoneal lymphadenopathy. No pelvic sidewall lymphadenopathy.  Reproductive: The uterus has normal CT imaging appearance. There is no adnexal mass.  Other: No intraperitoneal free fluid.  Musculoskeletal: Patient is status post left  total hip replacement. Streak artifact from the prosthesis obscures portions of the left pelvic sidewall. Bone windows reveal no worrisome lytic or sclerotic osseous lesions.  IMPRESSION: 1. No CT findings to explain the patient's history of left-sided flank and back pain. 2. 2.2 cm lesion upper pole right kidney compatible with a cyst. 3. Tiny right pleural effusion. 4. Abdominal aortic atherosclerosis.   Electronically Signed  By: Misty Stanley M.D.  On: 06/27/2015 16:36  CLINICAL DATA: Acute kidney injury.  EXAM: RENAL / URINARY TRACT ULTRASOUND COMPLETE  COMPARISON: CT 06/27/2015  FINDINGS: Right Kidney:  Length: 11.7 cm. 2.2 cm cyst in the upper pole. Normal echotexture. No hydronephrosis.  Left Kidney:  Length: 11.4 cm. 1.9 cm parapelvic cyst. Normal echotexture. No hydronephrosis.  Bladder:  Appears normal for degree of bladder distention.  IMPRESSION: Small bilateral renal cysts. No acute findings. No hydronephrosis.   Electronically Signed  By: Rolm Baptise M.D.  On: 06/28/2015 08:09  Assessment & Plan:    1. Recurrent UTI:   Per patient report, she is been having an ongoing infection since before January.  I do not have any documented positive urine cultures available to me at this time.  The antibiotics that she has been given in the past have not provided relief of her symptoms.  Today's UA is unremarkable.  We will continue to monitor.  - Urinalysis, Complete  2. Vaginal atrophy:    I explained to the patient that when women go through menopause and her estrogen levels are severely diminished, the normal vaginal flora will change.  This is due to an increase of the vaginal canal's pH. Because of this, the vaginal canal may be colonized by bacteria from the rectum instead of the protective lactobacillus.  This accompanied by the loss of the mucus barrier with vaginal atrophy is a cause of recurrent urinary tract infections.   Although I do not believe her symptoms are caused by infections, she did have significant atrophy and tenderness with palpation of the labia.  I would like her to start the vaginal estrogen cream.    Patient was given a sample of vaginal estrogen cream (Premarin) and instructed to apply 0.'5mg'$  (pea-sized amount)  just inside the vaginal introitus  with a finger-tip every night for two weeks and then Monday, Wednesday and Friday nights.  I explained to the patient that vaginally administered estrogen, which causes only a slight increase in the blood estrogen levels, have fewer contraindications and adverse systemic effects that oral HT.  She will return in 2 weeks for symptom recheck, exam and report if she was available to require the vaginal cream by prescription.  3. CKD:   Patient's most recent serum creatinine was 1.38 in 05/2015 with an eGFR of 33.  We will repeat a BUN + creatinine today.  4. Diabetes:   Patient's most recent Hgb A1c was 4.9% in 05/2015.  Recent studies suggest a less stringent A1c target of 8-9% in older patients.  If her HgbA1c is still low, she may be having hypoglycemic events while she is sleeping.  We will recheck her level today.    Return in about 2 weeks (around 11/10/2015) for exam .  These notes generated with voice recognition software. I apologize for typographical errors.  Royden Purl  [facility] [address], [place], [place] [address] [phone]  Addendum:   Patient's repeated serum creatinine is 1.22, eGFR is 40 which is somewhat improved from January. I would like her to have a RUS. Her Hbg A1C is also a little higher which is safer for an elderly patient. I would suggest speaking with her PCP about her diabetic medications as they may need adjusting due to decrease kidney function.

## 2015-10-28 ENCOUNTER — Telehealth: Payer: Self-pay

## 2015-10-28 ENCOUNTER — Other Ambulatory Visit: Payer: BLUE CROSS/BLUE SHIELD

## 2015-10-28 ENCOUNTER — Telehealth: Payer: Self-pay | Admitting: Urology

## 2015-10-28 DIAGNOSIS — Z794 Long term (current) use of insulin: Principal | ICD-10-CM

## 2015-10-28 DIAGNOSIS — E0822 Diabetes mellitus due to underlying condition with diabetic chronic kidney disease: Secondary | ICD-10-CM

## 2015-10-28 LAB — HEMOGLOBIN A1C
ESTIMATED AVERAGE GLUCOSE: 143 mg/dL
HEMOGLOBIN A1C: 6.6 % — AB (ref 4.8–5.6)

## 2015-10-28 LAB — URINALYSIS, COMPLETE
BILIRUBIN UA: NEGATIVE
GLUCOSE, UA: NEGATIVE
KETONES UA: NEGATIVE
Leukocytes, UA: NEGATIVE
NITRITE UA: NEGATIVE
Protein, UA: NEGATIVE
RBC UA: NEGATIVE
SPEC GRAV UA: 1.015 (ref 1.005–1.030)
UUROB: 0.2 mg/dL (ref 0.2–1.0)
pH, UA: 5 (ref 5.0–7.5)

## 2015-10-28 LAB — MICROSCOPIC EXAMINATION
BACTERIA UA: NONE SEEN
Epithelial Cells (non renal): >10 /hpf — AB (ref 0–10)
RBC, UA: NONE SEEN /hpf (ref 0–?)

## 2015-10-28 LAB — BASIC METABOLIC PANEL
BUN / CREAT RATIO: 20 (ref 12–28)
BUN: 25 mg/dL (ref 8–27)
CHLORIDE: 97 mmol/L (ref 96–106)
CO2: 19 mmol/L (ref 18–29)
Calcium: 9.2 mg/dL (ref 8.7–10.3)
Creatinine, Ser: 1.22 mg/dL — ABNORMAL HIGH (ref 0.57–1.00)
GFR calc Af Amer: 46 mL/min/{1.73_m2} — ABNORMAL LOW (ref >59)
GFR calc non Af Amer: 40 mL/min/{1.73_m2} — ABNORMAL LOW (ref >59)
GLUCOSE: 111 mg/dL — AB (ref 65–99)
POTASSIUM: 5.7 mmol/L — AB (ref 3.5–5.2)
SODIUM: 136 mmol/L (ref 134–144)

## 2015-10-28 NOTE — Telephone Encounter (Signed)
Spoke with pt in reference to kidney function, RUS, HgbA1C, and diabetic medications. Pt voiced understanding. RUS orders placed. Pt stated that she is having a PET scan tomorrow and would like to have both scans done at the same time. Nurse will call scheduling for pt.

## 2015-10-28 NOTE — Telephone Encounter (Signed)
-----   Message from Nori Riis, PA-C sent at 10/28/2015  8:21 AM EDT ----- Patient's kidney function is somewhat improved from January.  I would like her to have a RUS.  Her Hbg A1C is also a little higher which is safer for an elderly patient.  I would suggest speaking with her PCP about her diabetic medications as they may need adjusting due to decrease kidney function.

## 2015-10-28 NOTE — Telephone Encounter (Signed)
Would you please send a copy of this office visit to the patient's referring provider, Threasa Alpha, FNP?

## 2015-10-29 ENCOUNTER — Ambulatory Visit
Admission: RE | Admit: 2015-10-29 | Discharge: 2015-10-29 | Disposition: A | Discharge status: Home / Self Care | Payer: Medicare Other | Source: Ambulatory Visit | Attending: Internal Medicine | Admitting: Internal Medicine

## 2015-10-29 ENCOUNTER — Ambulatory Visit
Admission: RE | Admit: 2015-10-29 | Discharge: 2015-10-29 | Disposition: A | Discharge status: Home / Self Care | Payer: Medicare Other | Source: Ambulatory Visit | Attending: Urology | Admitting: Urology

## 2015-10-29 DIAGNOSIS — M278 Other specified diseases of jaws: Secondary | ICD-10-CM | POA: Diagnosis not present

## 2015-10-29 DIAGNOSIS — I2510 Atherosclerotic heart disease of native coronary artery without angina pectoris: Secondary | ICD-10-CM | POA: Diagnosis not present

## 2015-10-29 DIAGNOSIS — N281 Cyst of kidney, acquired: Secondary | ICD-10-CM | POA: Diagnosis not present

## 2015-10-29 DIAGNOSIS — I517 Cardiomegaly: Secondary | ICD-10-CM | POA: Insufficient documentation

## 2015-10-29 DIAGNOSIS — N390 Urinary tract infection, site not specified: Secondary | ICD-10-CM | POA: Insufficient documentation

## 2015-10-29 DIAGNOSIS — E0822 Diabetes mellitus due to underlying condition with diabetic chronic kidney disease: Secondary | ICD-10-CM

## 2015-10-29 DIAGNOSIS — R918 Other nonspecific abnormal finding of lung field: Secondary | ICD-10-CM | POA: Insufficient documentation

## 2015-10-29 DIAGNOSIS — C3491 Malignant neoplasm of unspecified part of right bronchus or lung: Secondary | ICD-10-CM

## 2015-10-29 LAB — GLUCOSE, CAPILLARY: Glucose-Capillary: 162 mg/dL — ABNORMAL HIGH (ref 65–99)

## 2015-10-29 MED ORDER — FLUDEOXYGLUCOSE F - 18 (FDG) INJECTION
12.6200 | Freq: Once | INTRAVENOUS | Status: AC | PRN
  Administered 2015-10-29: 12.62 via INTRAVENOUS

## 2015-10-29 NOTE — Telephone Encounter (Signed)
done

## 2015-11-03 ENCOUNTER — Telehealth: Payer: Self-pay

## 2015-11-03 NOTE — Telephone Encounter (Signed)
-----   Message from Nori Riis, PA-C sent at 10/30/2015  8:28 AM EDT ----- Please tell the patient that her RUS is normal.  We will see her on the 14th.

## 2015-11-03 NOTE — Telephone Encounter (Signed)
Spoke with pt in reference to RUS results. Pt voiced understanding.

## 2015-11-11 ENCOUNTER — Encounter: Payer: Self-pay | Admitting: Urology

## 2015-11-11 ENCOUNTER — Ambulatory Visit (INDEPENDENT_AMBULATORY_CARE_PROVIDER_SITE_OTHER): Payer: Medicare Other | Admitting: Urology

## 2015-11-11 VITALS — BP 117/58 | HR 62 | Ht 66.0 in | Wt 196.7 lb

## 2015-11-11 DIAGNOSIS — N390 Urinary tract infection, site not specified: Secondary | ICD-10-CM | POA: Diagnosis not present

## 2015-11-11 DIAGNOSIS — E0822 Diabetes mellitus due to underlying condition with diabetic chronic kidney disease: Secondary | ICD-10-CM

## 2015-11-11 DIAGNOSIS — N189 Chronic kidney disease, unspecified: Secondary | ICD-10-CM | POA: Diagnosis not present

## 2015-11-11 DIAGNOSIS — B373 Candidiasis of vulva and vagina: Secondary | ICD-10-CM | POA: Diagnosis not present

## 2015-11-11 DIAGNOSIS — N952 Postmenopausal atrophic vaginitis: Secondary | ICD-10-CM

## 2015-11-11 DIAGNOSIS — B3731 Acute candidiasis of vulva and vagina: Secondary | ICD-10-CM

## 2015-11-11 NOTE — Progress Notes (Signed)
10:54 AM   [name] 03-Feb-1926 [id number]  Referring provider: Vista Mink, [place], [place] [address]  Chief Complaint  Patient presents with  . Recurrent UTI    2 week follow up  . Vaginal Atrophy    HPI: Patient is in a 82-year-old diabetic Caucasian female who was referred to [place] for possible recurrent urinary tract infections, CKD and was found to have vaginal atrophy for which she was started on vaginal estrogen cream.  Recurrent UTI's Patient was referred by her PCP, Vista Mink, [place], for recurrent UTI.  Patient presented with her daughter, [name], stating that she has had an ongoing UTI since January 2017.  She was admitted to the hospital for hypoglycemia after being placed on Bactrim for an UTI by her PCP.   She states that her symptoms for urinary tract infection are left-sided flank pain, right sided pain and pain with urination.  I do not have a documented infection available to me at this time.   A urine culture performed during her hospitalization in January was negative. A urine culture performed at her primary care's office on 07/21/2015 noted no growth. She is having base line symptoms of frequency, urgency, dysuria, nocturia, incontinence, intermittency, hesitancy, straining to urinate and a weak urinary stream.  She has not had gross hematuria or suprapubic pain.  She denies any fevers, chills, nausea.  She states that she has been told that she has bilateral nephrolithiasis on a scan done at her PCP's office, but a CT renal stone study and a renal ultrasound performed in January 2017 did not identify any kidney, ureteral or bladder stones.  She was complaining of the left flank pain and left side pain at the time these imaging studies were performed.  Her CATH UA was unremarkable at her visit with [place] 2 weeks ago. She has not suffered symptoms of a urinary tract infection since she was last seen by [place]. I have not received any further  requested records concerning documented urinary tract infections.  CKD Serum creatinine was found to be 1.3 06/07/2015 with a eGFR 33.  Repeated serum creatinine was found to be 1.2 to with a eGFR of 40.  Renal ultrasound noted bilateral renal cysts otherwise a normal appearance of the kidneys. I reviewed the films with the patient and daughter.  Vaginal atrophy Patient is using the vaginal estrogen cream nightly.  She has subsequently developed itching and vaginal burning.  She has not had any vaginal discharge or bleeding.   PMH: Past Medical History  Diagnosis Date  . Diabetes mellitus without complication ([place])   . Hypertension   . Stroke ([place])   . COPD (chronic obstructive pulmonary disease) ([place])   . Coronary artery disease   . Cancer ([place])     lung cancer  . Heartburn   . Arrhythmia   . Arthritis   . Asthma   . Atrial fibrillation ([place])   . Depression   . Heart attack ([place])   . Heart failure ([place])   . Heart disease   . HLD (hyperlipidemia)   . Hyperthyroidism   . Kidney failure   . Sleep apnea     Surgical History: Past Surgical History  Procedure Laterality Date  . Total hip arthroplasty Left 2008  . Eye surgery  2012/2016  . Cholecystectomy  1983  . Thyroid surgery  1963    Home Medications:    Medication List  This list is accurate as of: 11/11/15 10:54 AM.  Always use your most recent med list.               AGGRENOX 200-25 MG 12hr capsule  Generic drug:  dipyridamole-aspirin  Take 1 capsule by mouth 2 (two) times daily.     amLODipine-benazepril 10-20 MG capsule  Commonly known as:  LOTREL  Take 1 capsule by mouth every evening.     ARANESP (ALBUMIN FREE) 300 MCG/0.6ML Sosy injection  Generic drug:  Darbepoetin Alfa  Reported on 11/11/2015     Butalbital-APAP-Caffeine 50-325-40 MG capsule  Take 1 capsule by mouth 4 (four) times daily as needed. Reported on 11/11/2015     DEXILANT 60 MG capsule  Generic drug:  dexlansoprazole  Take  60 mg by mouth daily.     estradiol 0.1 MG/GM vaginal cream  Commonly known as:  ESTRACE VAGINAL  Apply 0.18m (pea-sized amount)  just inside the vaginal introitus with a finger-tip every night for two weeks and then Monday, Wednesday and Friday nights.     Fenofibric Acid 135 MG Cpdr  Take 135 mg by mouth daily. Reported on 11/11/2015     fluticasone 50 MCG/BLIST diskus inhaler  Commonly known as:  FLOVENT DISKUS  Inhale 1-2 puffs into the lungs daily as needed. For rhinitis.     gabapentin 100 MG capsule  Commonly known as:  NEURONTIN  Take 100 mg by mouth 2 (two) times daily.     INTEGRA F 125-1 MG Caps  Take 1 capsule by mouth daily.     isosorbide mononitrate 60 MG 24 hr tablet  Commonly known as:  IMDUR  Take 60 mg by mouth 2 (two) times daily.     JANUVIA 100 MG tablet  Generic drug:  sitaGLIPtin  Take 100 mg by mouth every evening.     levalbuterol 1.25 MG/3ML nebulizer solution  Commonly known as:  XOPENEX  Take 1.25 mg by nebulization every 4 (four) hours as needed for wheezing or shortness of breath.     levalbuterol 45 MCG/ACT inhaler  Commonly known as:  XOPENEX HFA  Inhale 1 puff into the lungs 4 (four) times daily as needed. For wheezing/shortness of breath.     levothyroxine 125 MCG tablet  Commonly known as:  SYNTHROID, LEVOTHROID  Take 125 mcg by mouth daily before breakfast.     meclizine 25 MG tablet  Commonly known as:  ANTIVERT  Take 25 mg by mouth 2 (two) times daily.     meloxicam 7.5 MG tablet  Commonly known as:  MOBIC  Take 7.5 mg by mouth every other day.     metoprolol succinate 50 MG 24 hr tablet  Commonly known as:  TOPROL-XL  Take 25 mg by mouth 2 (two) times daily.     montelukast 10 MG tablet  Commonly known as:  SINGULAIR  Take 10 mg by mouth daily.     nitroGLYCERIN 0.4 MG SL tablet  Commonly known as:  NITROSTAT  Place 0.4 mg under the tongue every 5 (five) minutes x 3 doses as needed. For chest pain. *if no relief call MD  or go to emergency room*     OXYGEN  Inhale into the lungs.     rosuvastatin 20 MG tablet  Commonly known as:  CRESTOR  Take 20 mg by mouth daily.     SPIRIVA HANDIHALER 18 MCG inhalation capsule  Generic drug:  tiotropium  Place 18 mcg into inhaler and inhale daily.  torsemide 20 MG tablet  Commonly known as:  DEMADEX  Take 10-20 mg by mouth daily as needed. For edema/fluid.     traMADol-acetaminophen 37.5-325 MG tablet  Commonly known as:  ULTRACET     WELCHOL 625 MG tablet  Generic drug:  colesevelam        Allergies:  Allergies  Allergen Reactions  . Bactrim [Sulfamethoxazole-Trimethoprim]   . Darvon [Propoxyphene] Other (See Comments)    Patient states she was real weak, felt like she was going to pass out.  . Albuterol Palpitations and Other (See Comments)    Irregular heart beat, CAN TOLERATE XOPENEX  . Fentanyl Nausea And Vomiting and Rash  . Latex Rash  . Tetracyclines & Related Rash    Family History: Family History  Problem Relation Age of Onset  . Diabetes Other   . Hypertension Other   . Hematuria Father   . Kidney cancer Brother   . Kidney failure Mother   . Kidney failure Father   . Kidney Stones Brother     Social History:  reports that she has never smoked. She has never used smokeless tobacco. She reports that she does not drink alcohol or use illicit drugs.  ROS: UROLOGY Frequent Urination?: No Hard to postpone urination?: Yes Burning/pain with urination?: Yes Get up at night to urinate?: No Leakage of urine?: No Urine stream starts and stops?: Yes Trouble starting stream?: Yes Do you have to strain to urinate?: Yes Blood in urine?: No Urinary tract infection?: No Sexually transmitted disease?: No Injury to kidneys or bladder?: No Painful intercourse?: No Weak stream?: No Currently pregnant?: No Vaginal bleeding?: No Last menstrual period?: n  Gastrointestinal Nausea?: No Vomiting?: No Indigestion/heartburn?:  Yes Diarrhea?: No Constipation?: Yes  Constitutional Fever: No Night sweats?: No Weight loss?: No Fatigue?: Yes  Skin Skin rash/lesions?: No Itching?: Yes  Eyes Blurred vision?: No Double vision?: No  Ears/Nose/Throat Sore throat?: No Sinus problems?: Yes  Hematologic/Lymphatic Swollen glands?: No Easy bruising?: No  Cardiovascular Leg swelling?: Yes Chest pain?: No  Respiratory Cough?: No Shortness of breath?: No  Endocrine Excessive thirst?: No  Musculoskeletal Back pain?: Yes Joint pain?: Yes  Neurological Headaches?: Yes Dizziness?: Yes  Psychologic Depression?: No Anxiety?: No  Physical Exam: BP 117/58 mmHg  Pulse 62  Ht _0  (1.676 m)  Wt 196 lb 11.2 oz (89.223 kg)  BMI 31.76 kg/m2  Constitutional: Well nourished. Alert and oriented, No acute distress. HEENT: [place] AT, moist mucus membranes. Trachea midline, no masses. Cardiovascular: No clubbing, cyanosis, or edema. Respiratory: Normal respiratory effort, no increased work of breathing. GI: Abdomen is soft, non tender, non distended, no abdominal masses. Liver and spleen not palpable.  No hernias appreciated.  Stool sample for occult testing is not indicated.   GU: No CVA tenderness.  No bladder fullness or masses.  Atrophic and tender external genitalia, normal pubic hair distribution, no lesions.  There is an area in the upper right labia that contains a patch of erythema with satellite lesions and a white discharge.  Normal urethral meatus, no lesions, no prolapse, no discharge.   No urethral masses, tenderness and/or tenderness. No bladder fullness, tenderness or masses. Normal vagina mucosa, good estrogen effect, no discharge, no lesions, good pelvic support, no cystocele or rectocele noted.  No cervical motion tenderness.  Uterus is freely mobile and non-fixed.  No adnexal/parametria masses or tenderness noted.  Anus and perineum are without rashes or lesions.    Skin: No rashes, bruises or  suspicious  lesions. Lymph: No cervical or inguinal adenopathy. Neurologic: Grossly intact, no focal deficits, moving all 4 extremities. Psychiatric: Normal mood and affect.  Laboratory Data: Lab Results  Component Value Date   WBC 2.3* 06/29/2015   HGB 10.1* 06/29/2015   HCT 31.4* 06/29/2015   MCV 92.2 06/29/2015   PLT 227 06/29/2015    Lab Results  Component Value Date   CREATININE 1.22* 10/27/2015     Lab Results  Component Value Date   HGBA1C 6.6* 10/27/2015        Component Value Date/Time   CHOL 168 12/11/2013 0507   CHOL  07/24/2008 0910    126        ATP III CLASSIFICATION:  <200     mg/dL   Desirable  200-239  mg/dL   Borderline High  >=240    mg/dL   High          HDL 35* 12/11/2013 0507   HDL 31* 07/24/2008 0910   CHOLHDL 4.1 07/24/2008 0910   VLDL 58* 12/11/2013 0507   VLDL 35 07/24/2008 0910   LDLCALC 75 12/11/2013 0507   LDLCALC  07/24/2008 0910    60        Total Cholesterol/HDL:CHD Risk Coronary Heart Disease Risk Table                     Men   Women  1/2 Average Risk   3.4   3.3  Average Risk       5.0   4.4  2 X Average Risk   9.6   7.1  3 X Average Risk  23.4   11.0        Use the calculated Patient Ratio above and the CHD Risk Table to determine the patient's CHD Risk.        ATP III CLASSIFICATION (LDL):  <100     mg/dL   Optimal  100-129  mg/dL   Near or Above                    Optimal  130-159  mg/dL   Borderline  160-189  mg/dL   High  >190     mg/dL   Very High    Lab Results  Component Value Date   AST 21 06/27/2015   Lab Results  Component Value Date   ALT 11* 06/27/2015    Pertinent imaging CLINICAL DATA: Recurrent urinary tract infection. Left flank pain. Diabetes.  EXAM: RENAL / URINARY TRACT ULTRASOUND COMPLETE  COMPARISON: 10/29/2015  FINDINGS: Right Kidney:  Length: 11.4 cm. Cyst in the upper pole measures 2.4 x 2.3 x 2.2 cm. Echogenicity within normal limits. No mass or  hydronephrosis visualized.  Left Kidney:  Length: 11.8 cm. Interpolar cyst measures 1.4 x 0.8 x 1.1 cm. Echogenicity within normal limits. No mass or hydronephrosis visualized.  Bladder:  Appears normal for degree of bladder distention.  IMPRESSION: 1. Bilateral renal cysts. 2. Appearance of the kidneys is otherwise normal.   Electronically Signed  By: Kerby Moors M.D.  On: 10/29/2015 15:56  Assessment & Plan:    1. Recurrent UTI:   Per patient report, she is been having an ongoing infection since before January.  I do not have any documented positive urine cultures available to me at this time.  The antibiotics that she has been given in the past have not provided relief of her symptoms.   We will continue to monitor.  2. Vaginal atrophy:  Patient will continue the vaginal estrogen cream, applying it 3 nights weekly. She will follow-up in 2 weeks for exam and symptom recheck.  3. Vaginal yeast infection:  Patient will use over-the-counter Monistat cream for her vaginal yeast infection. She'll return in 2 weeks for a follow-up symptom check and exam.  4. CKD:   Patient's most recent serum creatinine was 1.38 in 05/2015 with an eGFR of 33.  Repeated several creatinine was found to be 1.22 at her last exam 2 weeks ago.  Her renal ultrasound completed on 10/29/2015 noted bilateral renal cysts, but otherwise the appearance of the kidneys was normal.  Defer further treatment to PCP.  5. Diabetes:   Patient's most recent Hgb A1c was 4.9% in 05/2015.  Recent studies suggest a less stringent A1c target of 8-9% in older patients.  Repeated hemoglobin A1c was found to be 6.6 on 10/27/2015. Defer further treatment to her PCP.  Return in about 2 weeks (around 11/25/2015) for exam.  These notes generated with voice recognition software. I apologize for typographical errors.  Zara Council, [facility] [address] [place],  [place] [address] [phone]

## 2015-11-24 ENCOUNTER — Encounter: Payer: Self-pay | Admitting: Urology

## 2015-11-24 ENCOUNTER — Ambulatory Visit (INDEPENDENT_AMBULATORY_CARE_PROVIDER_SITE_OTHER): Payer: Medicare Other | Admitting: Urology

## 2015-11-24 VITALS — BP 127/61 | HR 66 | Ht 66.0 in | Wt 193.2 lb

## 2015-11-24 DIAGNOSIS — N952 Postmenopausal atrophic vaginitis: Secondary | ICD-10-CM

## 2015-11-24 DIAGNOSIS — N390 Urinary tract infection, site not specified: Secondary | ICD-10-CM | POA: Diagnosis not present

## 2015-11-24 DIAGNOSIS — R300 Dysuria: Secondary | ICD-10-CM

## 2015-11-24 LAB — MICROSCOPIC EXAMINATION
Bacteria, UA: NONE SEEN
RBC MICROSCOPIC, UA: NONE SEEN /HPF (ref 0–?)

## 2015-11-24 LAB — URINALYSIS, COMPLETE
Bilirubin, UA: NEGATIVE
Glucose, UA: NEGATIVE
Ketones, UA: NEGATIVE
Leukocytes, UA: NEGATIVE
NITRITE UA: NEGATIVE
PH UA: 5 (ref 5.0–7.5)
Protein, UA: NEGATIVE
RBC, UA: NEGATIVE
Specific Gravity, UA: 1.01 (ref 1.005–1.030)
Urobilinogen, Ur: 0.2 mg/dL (ref 0.2–1.0)

## 2015-11-24 NOTE — Progress Notes (Signed)
In and Out Catheterization  Patient is present today for a I & O catheterization due to recheck of vaginal atrophy and yeast. Patient was cleaned and prepped in a sterile fashion with betadine and Lidocaine 2% jelly was instilled into the urethra.  A 14FR cath was inserted no complications were noted , 163m of urine return was noted, urine was yellow in color. A clean urine sample was collected for u/a. Bladder was drained  And catheter was removed with out difficulty.    Preformed by: CToniann Fail LPN

## 2015-11-26 LAB — CULTURE, URINE COMPREHENSIVE

## 2015-11-27 ENCOUNTER — Telehealth: Payer: Self-pay

## 2015-11-27 ENCOUNTER — Telehealth: Payer: Self-pay | Admitting: Urology

## 2015-11-27 NOTE — Telephone Encounter (Signed)
Patient will need to be scheduled for a cystoscopy.

## 2015-11-27 NOTE — Telephone Encounter (Signed)
-----   Message from Nori Riis, PA-C sent at 11/26/2015  5:03 PM EDT ----- Please tell the patient that her urine culture was negative.

## 2015-11-27 NOTE — Telephone Encounter (Signed)
Spoke with pt in reference to -ucx. Pt voiced understanding.  

## 2015-11-27 NOTE — Progress Notes (Signed)
10:04 AM   [name] April 25, 1926 [id number]  Referring provider: Vista [name], [place], [place] [address]  Chief Complaint  Patient presents with  . Vaginal Atrophy    2 weeks follow up    HPI: Patient is in an 82 year old diabetic Caucasian female who Is today for a recheck for a vaginal yeast infection.   Patient was originally referred to [place] for possible recurrent urinary tract infections, CKD and was found to have vaginal atrophy for which she was started on vaginal estrogen cream.  Recurrent UTI's Patient was referred by her PCP, [name], [place], for recurrent UTI.  Patient presented with her daughter, [name], stating that she has had an ongoing UTI since January 2017.  She was admitted to the hospital for hypoglycemia after being placed on Bactrim for an UTI by her PCP.   She states that her symptoms for urinary tract infection are left-sided flank pain, right sided pain and pain with urination.  I do not have a documented infection available to me at this time.   A urine culture performed during her hospitalization in January was negative. A urine culture performed at her primary care's office on 07/21/2015 noted no growth. She is having base line symptoms of frequency, urgency, dysuria, nocturia, incontinence, intermittency, hesitancy, straining to urinate and a weak urinary stream.  She has not had gross hematuria or suprapubic pain.  She denies any fevers, chills, nausea.  She states that she has been told that she has bilateral nephrolithiasis on a scan done at her PCP's office, but a CT renal stone study and a renal ultrasound performed in January 2017 did not identify any kidney, ureteral or bladder stones.  She was complaining of the left flank pain and left side pain at the time these imaging studies were performed.  Her CATH UA was unremarkable at her visit with [place] 4 weeks ago. She has not suffered symptoms of a urinary tract infection  since she was last seen by [place].  Requested records did not demonstrated any documented urinary tract infections.  Her catheter UA today is unremarkable.  CKD Serum creatinine was found to be 1.3 06/07/2015 with a eGFR 33.  Repeated serum creatinine was found to be 1.22 with a eGFR of 40.  Renal ultrasound noted bilateral renal cysts otherwise a normal appearance of the kidneys.   Vaginal atrophy Patient is using the vaginal estrogen cream nightly.  She has subsequently developed itching and vaginal burning.  She has not had any vaginal discharge or bleeding.  Vaginal yeast infection Patient has completed her Monistat regimen for her vaginal yeast infection.  She states she is feeling better.  She is still experiencing urgency, dysuria, intermittency, hesitancy and straining to urinate.  Her PVR is 100 cc.    PMH: Past Medical History  Diagnosis Date  . Diabetes mellitus without complication ([place])   . Hypertension   . Stroke ([place])   . COPD (chronic obstructive pulmonary disease) ([place])   . Coronary artery disease   . Cancer ([place])     lung cancer  . Heartburn   . Arrhythmia   . Arthritis   . Asthma   . Atrial fibrillation ([place])   . Depression   . Heart attack ([place])   . Heart failure ([place])   . Heart disease   . HLD (hyperlipidemia)   . Hyperthyroidism   . Kidney failure   . Sleep apnea  Surgical History: Past Surgical History  Procedure Laterality Date  . Total hip arthroplasty Left 2008  . Eye surgery  2012/2016  . Cholecystectomy  1983  . Thyroid surgery  1963    Home Medications:    Medication List       This list is accurate as of: 11/24/15 11:59 PM.  Always use your most recent med list.               AGGRENOX 200-25 MG 12hr capsule  Generic drug:  dipyridamole-aspirin  Take 1 capsule by mouth 2 (two) times daily.     amLODipine-benazepril 10-20 MG capsule  Commonly known as:  LOTREL  Take 1 capsule by mouth every evening.     ARANESP (ALBUMIN FREE)  300 MCG/0.6ML Sosy injection  Generic drug:  Darbepoetin Alfa  Reported on 11/11/2015     Butalbital-APAP-Caffeine 50-325-40 MG capsule  Take 1 capsule by mouth 4 (four) times daily as needed. Reported on 11/11/2015     DEXILANT 60 MG capsule  Generic drug:  dexlansoprazole  Take 60 mg by mouth daily.     estradiol 0.1 MG/GM vaginal cream  Commonly known as:  ESTRACE VAGINAL  Apply 0.'5mg'$  (pea-sized amount)  just inside the vaginal introitus with a finger-tip every night for two weeks and then Monday, Wednesday and Friday nights.     Fenofibric Acid 135 MG Cpdr  Take 135 mg by mouth daily. Reported on 11/24/2015     fluticasone 50 MCG/BLIST diskus inhaler  Commonly known as:  FLOVENT DISKUS  Inhale 1-2 puffs into the lungs daily as needed. For rhinitis.     gabapentin 100 MG capsule  Commonly known as:  NEURONTIN  Take 100 mg by mouth 2 (two) times daily.     INTEGRA F 125-1 MG Caps  Take 1 capsule by mouth daily.     isosorbide mononitrate 60 MG 24 hr tablet  Commonly known as:  IMDUR  Take 60 mg by mouth 2 (two) times daily.     JANUVIA 100 MG tablet  Generic drug:  sitaGLIPtin  Take 100 mg by mouth every evening.     levalbuterol 1.25 MG/3ML nebulizer solution  Commonly known as:  XOPENEX  Take 1.25 mg by nebulization every 4 (four) hours as needed for wheezing or shortness of breath.     levalbuterol 45 MCG/ACT inhaler  Commonly known as:  XOPENEX HFA  Inhale 1 puff into the lungs 4 (four) times daily as needed. For wheezing/shortness of breath.     levothyroxine 125 MCG tablet  Commonly known as:  SYNTHROID, LEVOTHROID  Take 125 mcg by mouth daily before breakfast.     meclizine 25 MG tablet  Commonly known as:  ANTIVERT  Take 25 mg by mouth 2 (two) times daily.     meloxicam 7.5 MG tablet  Commonly known as:  MOBIC  Take 7.5 mg by mouth every other day.     metoprolol succinate 50 MG 24 hr tablet  Commonly known as:  TOPROL-XL  Take 25 mg by mouth 2 (two)  times daily.     montelukast 10 MG tablet  Commonly known as:  SINGULAIR  Take 10 mg by mouth daily.     nitroGLYCERIN 0.4 MG SL tablet  Commonly known as:  NITROSTAT  Place 0.4 mg under the tongue every 5 (five) minutes x 3 doses as needed. For chest pain. *if no relief call MD or go to emergency room*     OXYGEN  Inhale  into the lungs.     rosuvastatin 20 MG tablet  Commonly known as:  CRESTOR  Take 20 mg by mouth daily.     SPIRIVA HANDIHALER 18 MCG inhalation capsule  Generic drug:  tiotropium  Place 18 mcg into inhaler and inhale daily.     torsemide 20 MG tablet  Commonly known as:  DEMADEX  Take 10-20 mg by mouth daily as needed. For edema/fluid.     traMADol-acetaminophen 37.5-325 MG tablet  Commonly known as:  ULTRACET     WELCHOL 625 MG tablet  Generic drug:  colesevelam        Allergies:  Allergies  Allergen Reactions  . Bactrim [Sulfamethoxazole-Trimethoprim]   . Darvon [Propoxyphene] Other (See Comments)    Patient states she was real weak, felt like she was going to pass out.  . Albuterol Palpitations and Other (See Comments)    Irregular heart beat, CAN TOLERATE XOPENEX  . Fentanyl Nausea And Vomiting and Rash  . Latex Rash  . Tetracyclines & Related Rash    Family History: Family History  Problem Relation Age of Onset  . Diabetes Other   . Hypertension Other   . Hematuria Father   . Kidney cancer Brother   . Kidney failure Mother   . Kidney failure Father   . Kidney Stones Brother     Social History:  reports that she has never smoked. She has never used smokeless tobacco. She reports that she does not drink alcohol or use illicit drugs.  ROS: UROLOGY Frequent Urination?: No Hard to postpone urination?: Yes Burning/pain with urination?: Yes Get up at night to urinate?: No Leakage of urine?: No Urine stream starts and stops?: Yes Trouble starting stream?: Yes Do you have to strain to urinate?: Yes Blood in urine?: No Urinary tract  infection?: No Sexually transmitted disease?: No Injury to kidneys or bladder?: No Painful intercourse?: No Weak stream?: No Currently pregnant?: No Vaginal bleeding?: No Last menstrual period?: n  Gastrointestinal Nausea?: No Vomiting?: No Indigestion/heartburn?: Yes Diarrhea?: No Constipation?: Yes  Constitutional Fever: Yes Night sweats?: No Weight loss?: No Fatigue?: Yes  Skin Skin rash/lesions?: Yes Itching?: Yes  Eyes Blurred vision?: No Double vision?: No  Ears/Nose/Throat Sore throat?: No Sinus problems?: Yes  Hematologic/Lymphatic Swollen glands?: No Easy bruising?: No  Cardiovascular Leg swelling?: Yes Chest pain?: No  Respiratory Cough?: No Shortness of breath?: Yes  Endocrine Excessive thirst?: Yes  Musculoskeletal Back pain?: Yes Joint pain?: No  Neurological Headaches?: Yes Dizziness?: Yes  Psychologic Depression?: No Anxiety?: No  Physical Exam: BP 127/61 mmHg  Pulse 66  Ht '5\' 6"'$  (1.676 m)  Wt 193 lb 3.2 oz (87.635 kg)  BMI 31.20 kg/m2  Constitutional: Well nourished. Alert and oriented, No acute distress. HEENT: [place] AT, moist mucus membranes. Trachea midline, no masses. Cardiovascular: No clubbing, cyanosis, or edema. Respiratory: Normal respiratory effort, no increased work of breathing. GI: Abdomen is soft, non tender, non distended, no abdominal masses. Liver and spleen not palpable.  No hernias appreciated.  Stool sample for occult testing is not indicated.   GU: No CVA tenderness.  No bladder fullness or masses.  Atrophic and tender external genitalia, normal pubic hair distribution, no lesions.  There is an area in the upper right labia that contains a patch of erythema with satellite lesions and a white discharge.  Normal urethral meatus, no lesions, no prolapse, no discharge.   No urethral masses, tenderness and/or tenderness. No bladder fullness, tenderness or masses. Normal vagina mucosa, good  estrogen effect, no  discharge, no lesions, good pelvic support, no cystocele or rectocele noted.  No cervical motion tenderness.  Uterus is freely mobile and non-fixed.  No adnexal/parametria masses or tenderness noted.  Anus and perineum are without rashes or lesions.    Skin: No rashes, bruises or suspicious lesions. Lymph: No cervical or inguinal adenopathy. Neurologic: Grossly intact, no focal deficits, moving all 4 extremities. Psychiatric: Normal mood and affect.  Laboratory Data: Lab Results  Component Value Date   WBC 2.3* 06/29/2015   HGB 10.1* 06/29/2015   HCT 31.4* 06/29/2015   MCV 92.2 06/29/2015   PLT 227 06/29/2015    Lab Results  Component Value Date   CREATININE 1.22* 10/27/2015     Lab Results  Component Value Date   HGBA1C 6.6* 10/27/2015        Component Value Date/Time   CHOL 168 12/11/2013 0507   CHOL  07/24/2008 0910    126        ATP III CLASSIFICATION:  <200     mg/dL   Desirable  200-239  mg/dL   Borderline High  >=240    mg/dL   High          HDL 35* 12/11/2013 0507   HDL 31* 07/24/2008 0910   CHOLHDL 4.1 07/24/2008 0910   VLDL 58* 12/11/2013 0507   VLDL 35 07/24/2008 0910   LDLCALC 75 12/11/2013 0507   LDLCALC  07/24/2008 0910    60        Total Cholesterol/HDL:CHD Risk Coronary Heart Disease Risk Table                     Men   Women  1/2 Average Risk   3.4   3.3  Average Risk       5.0   4.4  2 X Average Risk   9.6   7.1  3 X Average Risk  23.4   11.0        Use the calculated Patient Ratio above and the CHD Risk Table to determine the patient's CHD Risk.        ATP III CLASSIFICATION (LDL):  <100     mg/dL   Optimal  100-129  mg/dL   Near or Above                    Optimal  130-159  mg/dL   Borderline  160-189  mg/dL   High  >190     mg/dL   Very High    Lab Results  Component Value Date   AST 21 06/27/2015   Lab Results  Component Value Date   ALT 11* 06/27/2015    Pertinent imaging CLINICAL DATA: Recurrent urinary tract  infection. Left flank pain. Diabetes.  EXAM: RENAL / URINARY TRACT ULTRASOUND COMPLETE  COMPARISON: 10/29/2015  FINDINGS: Right Kidney:  Length: 11.4 cm. Cyst in the upper pole measures 2.4 x 2.3 x 2.2 cm. Echogenicity within normal limits. No mass or hydronephrosis visualized.  Left Kidney:  Length: 11.8 cm. Interpolar cyst measures 1.4 x 0.8 x 1.1 cm. Echogenicity within normal limits. No mass or hydronephrosis visualized.  Bladder:  Appears normal for degree of bladder distention.  IMPRESSION: 1. Bilateral renal cysts. 2. Appearance of the kidneys is otherwise normal.   Electronically Signed  By: Kerby Moors M.D.  On: 10/29/2015 15:56  Assessment & Plan:    1. Recurrent UTI:   Per patient report, she is been having an  ongoing infection since before January.  I do not have any documented positive urine cultures available to me at this time.  The antibiotics that she has been given in the past have not provided relief of her symptoms.   CATH UA obtained today was unremarkable.  I will send the urine for culture for completeness.  2. Vaginal atrophy:    Patient will continue the vaginal estrogen cream, applying it 3 nights weekly.    3. Vaginal yeast infection:  Resolved.  4. CKD:   Patient's most recent serum creatinine was 1.38 in 05/2015 with an eGFR of 33.  Repeated several creatinine was found to be 1.22 at her last exam 2 weeks ago.  Her renal ultrasound completed on 10/29/2015 noted bilateral renal cysts, but otherwise the appearance of the kidneys was normal.  Defer further treatment to PCP.  5. Diabetes:   Patient's most recent Hgb A1c was 4.9% in 05/2015.  Recent studies suggest a less stringent A1c target of 8-9% in older patients.  Repeated hemoglobin A1c was found to be 6.6 on 10/27/2015. Defer further treatment to her PCP.  6. Dysuria:  Patient is still experiencing dysuria. Her UA is unremarkable. I will send it for culture. If the  culture returns negative, we will schedule cystoscopy to rule out CIS.    Return for pending urine culture .  These notes generated with voice recognition software. I apologize for typographical errors.  Zara Council, [facility] [address] [place], [place] [address] [phone]

## 2015-12-08 ENCOUNTER — Telehealth: Payer: Self-pay | Admitting: Urology

## 2015-12-08 NOTE — Telephone Encounter (Signed)
Done, I spoke with the patient and mailed her the cysto information and her appt   michelle

## 2015-12-29 ENCOUNTER — Ambulatory Visit (INDEPENDENT_AMBULATORY_CARE_PROVIDER_SITE_OTHER): Payer: Medicare Other | Admitting: Urology

## 2015-12-29 DIAGNOSIS — N390 Urinary tract infection, site not specified: Secondary | ICD-10-CM | POA: Diagnosis not present

## 2015-12-29 DIAGNOSIS — R300 Dysuria: Secondary | ICD-10-CM | POA: Diagnosis not present

## 2015-12-29 LAB — URINALYSIS, COMPLETE
BILIRUBIN UA: NEGATIVE
GLUCOSE, UA: NEGATIVE
Ketones, UA: NEGATIVE
Nitrite, UA: NEGATIVE
PH UA: 5 (ref 5.0–7.5)
PROTEIN UA: NEGATIVE
RBC UA: NEGATIVE
SPEC GRAV UA: 1.01 (ref 1.005–1.030)
Urobilinogen, Ur: 0.2 mg/dL (ref 0.2–1.0)

## 2015-12-29 LAB — MICROSCOPIC EXAMINATION
Epithelial Cells (non renal): >10 /hpf — AB (ref 0–10)
RBC, UA: NONE SEEN /hpf (ref 0–?)

## 2015-12-29 MED ORDER — LIDOCAINE HCL 2 % EX GEL
1.0000 "application " | Freq: Once | CUTANEOUS | Status: AC
  Administered 2015-12-29: 1 via URETHRAL

## 2015-12-29 MED ORDER — URIBEL 118 MG PO CAPS: 1.0000 | ORAL_CAPSULE | Freq: Two times a day (BID) | ORAL | 0 refills | Status: DC

## 2015-12-29 MED ORDER — CIPROFLOXACIN HCL 500 MG PO TABS
500.0000 mg | ORAL_TABLET | Freq: Once | ORAL | Status: AC
  Administered 2015-12-29: 500 mg via ORAL

## 2015-12-29 NOTE — Progress Notes (Signed)
12/29/2015 3:05 PM   [name] 17-Mar-1926 [id number]  Referring provider: Vista Mink, [place] [address] [place], [place] [address]  Chief Complaint  Patient presents with  . Cysto    recurrent UTI    HPI: [name] is a 82yo here for cystoscopy for recurrent UTIs. Renal US shows bilateral renal cysts. No claculi   PMH: Past Medical History:  Diagnosis Date  . Arrhythmia   . Arthritis   . Asthma   . Atrial fibrillation ([place])   . Cancer ([place])    lung cancer  . COPD (chronic obstructive pulmonary disease) ([place])   . Coronary artery disease   . Depression   . Diabetes mellitus without complication ([place])   . Heart attack ([place])   . Heart disease   . Heart failure ([place])   . Heartburn   . HLD (hyperlipidemia)   . Hypertension   . Hyperthyroidism   . Kidney failure   . Sleep apnea   . Stroke [facility])     Surgical History: Past Surgical History:  Procedure Laterality Date  . CHOLECYSTECTOMY  1983  . EYE SURGERY  2012/2016  . THYROID SURGERY  1963  . TOTAL HIP ARTHROPLASTY Left 2008    Home Medications:    Medication List       Accurate as of 12/29/15  3:05 PM. Always use your most recent med list.          AGGRENOX 200-25 MG 12hr capsule Generic drug:  dipyridamole-aspirin Take 1 capsule by mouth 2 (two) times daily.   amLODipine-benazepril 10-20 MG capsule Commonly known as:  LOTREL Take 1 capsule by mouth every evening.   ARANESP (ALBUMIN FREE) 300 MCG/0.6ML Sosy injection Generic drug:  Darbepoetin Alfa Reported on 11/11/2015   Butalbital-APAP-Caffeine 50-325-40 MG capsule Take 1 capsule by mouth 4 (four) times daily as needed. Reported on 11/11/2015   DEXILANT 60 MG capsule Generic drug:  dexlansoprazole Take 60 mg by mouth daily.   estradiol 0.1 MG/GM vaginal cream Commonly known as:  ESTRACE VAGINAL Apply 0.'5mg'$  (pea-sized amount)  just inside the vaginal introitus with a finger-tip every night for two weeks and then Monday, Wednesday  and Friday nights.   Fenofibric Acid 135 MG Cpdr Take 135 mg by mouth daily. Reported on 11/24/2015   fluticasone 50 MCG/BLIST diskus inhaler Commonly known as:  FLOVENT DISKUS Inhale 1-2 puffs into the lungs daily as needed. For rhinitis.   gabapentin 100 MG capsule Commonly known as:  NEURONTIN Take 100 mg by mouth 2 (two) times daily.   INTEGRA F 125-1 MG Caps Take 1 capsule by mouth daily.   isosorbide mononitrate 60 MG 24 hr tablet Commonly known as:  IMDUR Take 60 mg by mouth 2 (two) times daily.   JANUVIA 100 MG tablet Generic drug:  sitaGLIPtin Take 100 mg by mouth every evening.   levalbuterol 1.25 MG/3ML nebulizer solution Commonly known as:  XOPENEX Take 1.25 mg by nebulization every 4 (four) hours as needed for wheezing or shortness of breath.   levalbuterol 45 MCG/ACT inhaler Commonly known as:  XOPENEX HFA Inhale 1 puff into the lungs 4 (four) times daily as needed. For wheezing/shortness of breath.   levothyroxine 125 MCG tablet Commonly known as:  SYNTHROID, LEVOTHROID Take 125 mcg by mouth daily before breakfast.   meclizine 25 MG tablet Commonly known as:  ANTIVERT Take 25 mg by mouth 2 (two) times daily.   meloxicam 7.5 MG tablet Commonly known as:  MOBIC Take  7.5 mg by mouth every other day.   metoprolol succinate 50 MG 24 hr tablet Commonly known as:  TOPROL-XL Take 25 mg by mouth 2 (two) times daily.   montelukast 10 MG tablet Commonly known as:  SINGULAIR Take 10 mg by mouth daily.   nitroGLYCERIN 0.4 MG SL tablet Commonly known as:  NITROSTAT Place 0.4 mg under the tongue every 5 (five) minutes x 3 doses as needed. For chest pain. *if no relief call MD or go to emergency room*   OXYGEN Inhale into the lungs.   rosuvastatin 20 MG tablet Commonly known as:  CRESTOR Take 20 mg by mouth daily.   SPIRIVA HANDIHALER 18 MCG inhalation capsule Generic drug:  tiotropium Place 18 mcg into inhaler and inhale daily.   torsemide 20 MG  tablet Commonly known as:  DEMADEX Take 10-20 mg by mouth daily as needed. For edema/fluid.   traMADol-acetaminophen 37.5-325 MG tablet Commonly known as:  ULTRACET   URIBEL 118 MG Caps Take 1 capsule (118 mg total) by mouth 2 (two) times daily.   WELCHOL 625 MG tablet Generic drug:  colesevelam       Allergies:  Allergies  Allergen Reactions  . Bactrim [Sulfamethoxazole-Trimethoprim]   . Darvon [Propoxyphene] Other (See Comments)    Patient states she was real weak, felt like she was going to pass out.  . Albuterol Palpitations and Other (See Comments)    Irregular heart beat, CAN TOLERATE XOPENEX  . Fentanyl Nausea And Vomiting and Rash  . Latex Rash  . Tetracyclines & Related Rash    Family History: Family History  Problem Relation Age of Onset  . Diabetes Other   . Hypertension Other   . Hematuria Father   . Kidney cancer Brother   . Kidney failure Mother   . Kidney failure Father   . Kidney Stones Brother     Social History:  reports that she has never smoked. She has never used smokeless tobacco. She reports that she does not drink alcohol or use drugs.  ROS:                                        Physical Exam: There were no vitals taken for this visit.  Constitutional:  Alert and oriented, No acute distress. HEENT: [place] AT, moist mucus membranes.  Trachea midline, no masses. Cardiovascular: No clubbing, cyanosis, or edema. Respiratory: Normal respiratory effort, no increased work of breathing. GI: Abdomen is soft, nontender, nondistended, no abdominal masses GU: No CVA tenderness.  Skin: No rashes, bruises or suspicious lesions. Lymph: No cervical or inguinal adenopathy. Neurologic: Grossly intact, no focal deficits, moving all 4 extremities. Psychiatric: Normal mood and affect.  Laboratory Data: Lab Results  Component Value Date   WBC 2.3 (L) 06/29/2015   HGB 10.1 (L) 06/29/2015   HCT 31.4 (L) 06/29/2015   MCV 92.2  06/29/2015   PLT 227 06/29/2015    Lab Results  Component Value Date   CREATININE 1.22 (H) 10/27/2015    No results found for: PSA  No results found for: TESTOSTERONE  Lab Results  Component Value Date   HGBA1C 6.6 (H) 10/27/2015    Urinalysis    Component Value Date/Time   COLORURINE YELLOW (A) 06/27/2015 1447   APPEARANCEUR Cloudy (A) 11/24/2015 1101   LABSPEC 1.008 06/27/2015 1447   LABSPEC 1.010 12/10/2013 1631   LABSPEC 1.020 12/24/2008 1108  PHURINE 6.0 06/27/2015 1447   GLUCOSEU Negative 11/24/2015 1101   GLUCOSEU Negative 12/10/2013 1631   HGBUR NEGATIVE 06/27/2015 1447   BILIRUBINUR Negative 11/24/2015 1101   BILIRUBINUR Negative 12/10/2013 1631   KETONESUR NEGATIVE 06/27/2015 1447   PROTEINUR Negative 11/24/2015 1101   PROTEINUR NEGATIVE 06/27/2015 1447   UROBILINOGEN 0.2 08/15/2007 1839   NITRITE Negative 11/24/2015 1101   NITRITE NEGATIVE 06/27/2015 1447   LEUKOCYTESUR Negative 11/24/2015 1101   LEUKOCYTESUR Trace 12/10/2013 1631    Pertinent Imaging: None     Cystoscopy Procedure Note  Patient identification was confirmed, informed consent was obtained, and patient was prepped using Betadine solution.  Lidocaine jelly was administered per urethral meatus.    Preoperative abx where received prior to procedure.    Procedure: - Flexible cystoscope introduced, without any difficulty.   - Thorough search of the bladder revealed:    normal urethral meatus    normal urothelium    no stones    no ulcers     no tumors    no urethral polyps    no trabeculation  - Ureteral orifices were normal in position and appearance.  Post-Procedure: - Patient tolerated the procedure well   Assessment & Plan:    1. Recurrent UTI - Urinalysis, Complete - lidocaine (XYLOCAINE) 2 % jelly 1 application; Place 1 application into the urethra once. - ciprofloxacin (CIPRO) tablet 500 mg; Take 1 tablet (500 mg total) by mouth once.  2. Dysuria -continue  estrace 3x per week -uribel BID PRN dysuria   Return in about 4 weeks (around 01/26/2016).  Nicolette Bang, MD  [facility] [address] [place], [place] [address] [phone]

## 2016-01-04 ENCOUNTER — Telehealth: Payer: Self-pay | Admitting: Urology

## 2016-01-04 NOTE — Telephone Encounter (Signed)
Spoke with pt in reference to medication. Made pt aware insurance will not cover uribel. Pt voiced understanding. Pt then stated that she is having some pressure, increased frequency, severe dysuria, and a low grade fever of 99.8. Offered for pt to come in for a nurse visit today. Pt declined. Pt stated that she is going to see her PCP tomorrow and will have him treat her. Reinforced with pt should her symptoms gets worse over night to go to the ER. Pt voiced understanding.

## 2016-01-05 ENCOUNTER — Telehealth: Payer: Self-pay

## 2016-01-05 NOTE — Telephone Encounter (Signed)
Pt called stating she went to see her PCP today who dx her with a UTI and is treating it. Pt wanted to make [facility] aware.

## 2016-02-02 ENCOUNTER — Ambulatory Visit: Payer: Medicare Other

## 2016-07-04 ENCOUNTER — Emergency Department: Payer: Medicare Other

## 2016-07-04 ENCOUNTER — Encounter: Payer: Self-pay | Admitting: Emergency Medicine

## 2016-07-04 ENCOUNTER — Observation Stay
Admission: EM | Admit: 2016-07-04 | Discharge: 2016-07-06 | Disposition: A | Discharge status: Home / Self Care | Payer: Medicare Other | Attending: Internal Medicine | Admitting: Internal Medicine

## 2016-07-04 DIAGNOSIS — I2510 Atherosclerotic heart disease of native coronary artery without angina pectoris: Secondary | ICD-10-CM | POA: Diagnosis not present

## 2016-07-04 DIAGNOSIS — Z888 Allergy status to other drugs, medicaments and biological substances status: Secondary | ICD-10-CM | POA: Diagnosis not present

## 2016-07-04 DIAGNOSIS — Z7984 Long term (current) use of oral hypoglycemic drugs: Secondary | ICD-10-CM | POA: Insufficient documentation

## 2016-07-04 DIAGNOSIS — I252 Old myocardial infarction: Secondary | ICD-10-CM | POA: Insufficient documentation

## 2016-07-04 DIAGNOSIS — D649 Anemia, unspecified: Secondary | ICD-10-CM | POA: Insufficient documentation

## 2016-07-04 DIAGNOSIS — Z87440 Personal history of urinary (tract) infections: Secondary | ICD-10-CM | POA: Diagnosis not present

## 2016-07-04 DIAGNOSIS — E785 Hyperlipidemia, unspecified: Secondary | ICD-10-CM | POA: Insufficient documentation

## 2016-07-04 DIAGNOSIS — I440 Atrioventricular block, first degree: Secondary | ICD-10-CM | POA: Diagnosis not present

## 2016-07-04 DIAGNOSIS — E1122 Type 2 diabetes mellitus with diabetic chronic kidney disease: Secondary | ICD-10-CM | POA: Insufficient documentation

## 2016-07-04 DIAGNOSIS — J449 Chronic obstructive pulmonary disease, unspecified: Secondary | ICD-10-CM | POA: Diagnosis not present

## 2016-07-04 DIAGNOSIS — R42 Dizziness and giddiness: Secondary | ICD-10-CM | POA: Insufficient documentation

## 2016-07-04 DIAGNOSIS — Z7902 Long term (current) use of antithrombotics/antiplatelets: Secondary | ICD-10-CM | POA: Insufficient documentation

## 2016-07-04 DIAGNOSIS — M47816 Spondylosis without myelopathy or radiculopathy, lumbar region: Secondary | ICD-10-CM | POA: Diagnosis not present

## 2016-07-04 DIAGNOSIS — N189 Chronic kidney disease, unspecified: Secondary | ICD-10-CM | POA: Insufficient documentation

## 2016-07-04 DIAGNOSIS — Z96642 Presence of left artificial hip joint: Secondary | ICD-10-CM | POA: Diagnosis not present

## 2016-07-04 DIAGNOSIS — R079 Chest pain, unspecified: Principal | ICD-10-CM | POA: Diagnosis present

## 2016-07-04 DIAGNOSIS — F329 Major depressive disorder, single episode, unspecified: Secondary | ICD-10-CM | POA: Diagnosis not present

## 2016-07-04 DIAGNOSIS — I129 Hypertensive chronic kidney disease with stage 1 through stage 4 chronic kidney disease, or unspecified chronic kidney disease: Secondary | ICD-10-CM | POA: Diagnosis not present

## 2016-07-04 DIAGNOSIS — Z79899 Other long term (current) drug therapy: Secondary | ICD-10-CM | POA: Insufficient documentation

## 2016-07-04 DIAGNOSIS — Z85118 Personal history of other malignant neoplasm of bronchus and lung: Secondary | ICD-10-CM | POA: Insufficient documentation

## 2016-07-04 DIAGNOSIS — R001 Bradycardia, unspecified: Secondary | ICD-10-CM | POA: Diagnosis not present

## 2016-07-04 DIAGNOSIS — Z791 Long term (current) use of non-steroidal anti-inflammatories (NSAID): Secondary | ICD-10-CM | POA: Diagnosis not present

## 2016-07-04 DIAGNOSIS — I4891 Unspecified atrial fibrillation: Secondary | ICD-10-CM | POA: Insufficient documentation

## 2016-07-04 DIAGNOSIS — Z8719 Personal history of other diseases of the digestive system: Secondary | ICD-10-CM | POA: Insufficient documentation

## 2016-07-04 DIAGNOSIS — G4733 Obstructive sleep apnea (adult) (pediatric): Secondary | ICD-10-CM | POA: Diagnosis not present

## 2016-07-04 DIAGNOSIS — Z8673 Personal history of transient ischemic attack (TIA), and cerebral infarction without residual deficits: Secondary | ICD-10-CM | POA: Insufficient documentation

## 2016-07-04 LAB — URINALYSIS, COMPLETE (UACMP) WITH MICROSCOPIC
Bacteria, UA: NONE SEEN
Bilirubin Urine: NEGATIVE
GLUCOSE, UA: NEGATIVE mg/dL
Hgb urine dipstick: NEGATIVE
Ketones, ur: NEGATIVE mg/dL
Leukocytes, UA: NEGATIVE
NITRITE: NEGATIVE
PH: 5 (ref 5.0–8.0)
Protein, ur: NEGATIVE mg/dL
RBC / HPF: NONE SEEN RBC/hpf (ref 0–5)
SPECIFIC GRAVITY, URINE: 1.006 (ref 1.005–1.030)
WBC, UA: NONE SEEN WBC/hpf (ref 0–5)

## 2016-07-04 LAB — BASIC METABOLIC PANEL
ANION GAP: 8 (ref 5–15)
BUN: 45 mg/dL — ABNORMAL HIGH (ref 6–20)
CALCIUM: 9.2 mg/dL (ref 8.9–10.3)
CO2: 23 mmol/L (ref 22–32)
Chloride: 104 mmol/L (ref 101–111)
Creatinine, Ser: 1.46 mg/dL — ABNORMAL HIGH (ref 0.44–1.00)
GFR, EST AFRICAN AMERICAN: 36 mL/min — AB (ref >60)
GFR, EST NON AFRICAN AMERICAN: 31 mL/min — AB (ref >60)
Glucose, Bld: 147 mg/dL — ABNORMAL HIGH (ref 65–99)
Potassium: 5.2 mmol/L — ABNORMAL HIGH (ref 3.5–5.1)
SODIUM: 135 mmol/L (ref 135–145)

## 2016-07-04 LAB — TROPONIN I: Troponin I: <0.03 ng/mL (ref <0.03)

## 2016-07-04 LAB — CBC WITH DIFFERENTIAL/PLATELET
BASOS ABS: 0 10*3/uL (ref 0–0.1)
BASOS PCT: 1 %
EOS ABS: 0.1 10*3/uL (ref 0–0.7)
Eosinophils Relative: 2 %
HEMATOCRIT: 30 % — AB (ref 35.0–47.0)
Hemoglobin: 10.4 g/dL — ABNORMAL LOW (ref 12.0–16.0)
Lymphocytes Relative: 29 %
Lymphs Abs: 1.2 10*3/uL (ref 1.0–3.6)
MCH: 30.8 pg (ref 26.0–34.0)
MCHC: 34.6 g/dL (ref 32.0–36.0)
MCV: 89.1 fL (ref 80.0–100.0)
Monocytes Absolute: 0.5 10*3/uL (ref 0.2–0.9)
Monocytes Relative: 13 %
NEUTROS ABS: 2.3 10*3/uL (ref 1.4–6.5)
NEUTROS PCT: 55 %
PLATELETS: 235 10*3/uL (ref 150–440)
RBC: 3.37 MIL/uL — ABNORMAL LOW (ref 3.80–5.20)
RDW: 13.4 % (ref 11.5–14.5)
WBC: 4.1 10*3/uL (ref 3.6–11.0)

## 2016-07-04 MED ORDER — LEVOFLOXACIN IN D5W 750 MG/150ML IV SOLN
750.0000 mg | Freq: Once | INTRAVENOUS | Status: AC
  Administered 2016-07-04: 750 mg via INTRAVENOUS
  Filled 2016-07-04: qty 150

## 2016-07-04 MED ORDER — PANTOPRAZOLE SODIUM 40 MG IV SOLR
40.0000 mg | INTRAVENOUS | Status: DC
  Administered 2016-07-04 – 2016-07-05 (×2): 40 mg via INTRAVENOUS
  Filled 2016-07-04 (×2): qty 40

## 2016-07-04 NOTE — ED Triage Notes (Signed)
Chest pain began 4 p today. Still has slight chest pain. Slight SOB.

## 2016-07-04 NOTE — H&P (Addendum)
[place] [facility] Admission History and Physical [employer], D.O.  ---------------------------------------------------------------------------------------------------------------------   PATIENT NAME: [name] MR#: [id number] DATE OF BIRTH: May 28, 1926 DATE OF ADMISSION: 07/04/2016 PRIMARY CARE PHYSICIAN: Vista Mink, FNP  REQUESTING/REFERRING PHYSICIAN: ED Dr. Archie Balboa  CHIEF COMPLAINT: Chief Complaint  Patient presents with  . Chest Pain    HISTORY OF PRESENT ILLNESS: [name] is a 83 y.o. female with a known history of Atrial fibrillation, COPD/asthma, coronary artery disease status post MI, diabetes, hypertension, hyperlipidemia, CVA presents to the emergency department for evaluation of chest pain.  Patient was in a usual state of health until today when she describes sudden onset of burning midsternal chest pain that radiated to the right and left chest as well as down both arms. Pain was associated with shortness of breath but she denied nausea, vomiting, diaphoresis, lightheadedness, palpitations. Patient states that she has had this similar type of pain in the past when she has had heart attacks. She reports generally that she has been feeling unwell for the past year or so since her hospitalization in January 2017. She has suffered with multiple urinary tract infections over the past year and has suffered from worsening deconditioning after each infection.  Otherwise there has been no change in status. Patient has been taking medication as prescribed and there has been no recent change in medication or diet.  There has been no recent illness, travel or sick contacts.    Patient denies fevers/chills, cough, congestion, weakness, dizziness, shortness of breath, N/V/C/D, abdominal pain, dysuria/frequency, changes in mental status.   EMS/ED COURSE:   Patient received Levaquin and protonix.  PAST MEDICAL HISTORY: Past Medical History:   Diagnosis Date  . Arrhythmia   . Arthritis   . Asthma   . Atrial fibrillation ([place])   . Cancer ([place])    lung cancer  . COPD (chronic obstructive pulmonary disease) ([place])   . Coronary artery disease   . Depression   . Diabetes mellitus without complication ([place])   . Heart attack   . Heart disease   . Heart failure ([place])   . Heartburn   . HLD (hyperlipidemia)   . Hypertension   . Hyperthyroidism   . Kidney failure   . Sleep apnea   . Stroke [facility])       PAST SURGICAL HISTORY: Past Surgical History:  Procedure Laterality Date  . CHOLECYSTECTOMY  1983  . EYE SURGERY  2012/2016  . THYROID SURGERY  1963  . TOTAL HIP ARTHROPLASTY Left 2008      SOCIAL HISTORY: Social History  Substance Use Topics  . Smoking status: Never Smoker  . Smokeless tobacco: Never Used  . Alcohol use No      FAMILY HISTORY: Family History  Problem Relation Age of Onset  . Diabetes Other   . Hypertension Other   . Hematuria Father   . Kidney failure Father   . Kidney cancer Brother   . Kidney failure Mother   . Kidney Stones Brother      MEDICATIONS AT HOME: Prior to Admission medications   Medication Sig Start Date End Date Taking? Authorizing Provider  amLODipine-benazepril (LOTREL) 10-20 MG capsule Take 1 capsule by mouth every evening. 08/20/13  Yes Historical Provider, MD  dipyridamole-aspirin (AGGRENOX) 200-25 MG 12hr capsule Take 1 capsule by mouth 2 (two) times daily. 09/26/13  Yes Historical Provider, MD  Fe Fum-FePoly-FA-Vit C-Vit B3 (INTEGRA F) 125-1 MG CAPS Take 1 capsule by mouth daily.   Yes Historical  Provider, MD  gabapentin (NEURONTIN) 100 MG capsule Take 100 mg by mouth 2 (two) times daily. 05/26/15  Yes Historical Provider, MD  glipiZIDE (GLUCOTROL XL) 10 MG 24 hr tablet Take 10 mg by mouth daily with breakfast.   Yes Historical Provider, MD  isosorbide mononitrate (IMDUR) 60 MG 24 hr tablet Take 60 mg by mouth 2 (two) times daily. 05/26/15  Yes Historical Provider, MD   JANUVIA 100 MG tablet Take 100 mg by mouth every evening. 06/09/15  Yes Historical Provider, MD  levalbuterol (XOPENEX HFA) 45 MCG/ACT inhaler Inhale 1 puff into the lungs 4 (four) times daily as needed. For wheezing/shortness of breath.   Yes Historical Provider, MD  levalbuterol (XOPENEX) 1.25 MG/3ML nebulizer solution Take 1.25 mg by nebulization every 4 (four) hours as needed for wheezing or shortness of breath.   Yes Historical Provider, MD  levothyroxine (SYNTHROID, LEVOTHROID) 125 MCG tablet Take 125 mcg by mouth daily before breakfast. 06/09/15  Yes Historical Provider, MD  meclizine (ANTIVERT) 25 MG tablet Take 25 mg by mouth 2 (two) times daily.   Yes Historical Provider, MD  meloxicam (MOBIC) 7.5 MG tablet Take 7.5 mg by mouth every other day. 05/18/15  Yes Historical Provider, MD  metoprolol succinate (TOPROL-XL) 50 MG 24 hr tablet Take 25 mg by mouth 2 (two) times daily. 03/31/15  Yes Historical Provider, MD  montelukast (SINGULAIR) 10 MG tablet Take 10 mg by mouth daily. 06/17/15  Yes Historical Provider, MD  rosuvastatin (CRESTOR) 20 MG tablet Take 20 mg by mouth daily.  05/18/15  Yes Historical Provider, MD  SPIRIVA HANDIHALER 18 MCG inhalation capsule Place 18 mcg into inhaler and inhale daily. 05/26/15  Yes Historical Provider, MD  torsemide (DEMADEX) 20 MG tablet Take 10-20 mg by mouth daily as needed. For edema/fluid. 05/18/15  Yes Historical Provider, MD  Butalbital-APAP-Caffeine 50-325-40 MG capsule Take 1 capsule by mouth 4 (four) times daily as needed. Reported on 11/11/2015    Historical Provider, MD  Choline Fenofibrate (FENOFIBRIC ACID) 135 MG CPDR Take 135 mg by mouth daily. Reported on 11/24/2015 06/22/15   Historical Provider, MD  estradiol (ESTRACE VAGINAL) 0.1 MG/GM vaginal cream Apply 0.'5mg'$  (pea-sized amount)  just inside the vaginal introitus with a finger-tip every night for two weeks and then Monday, Wednesday and Friday nights. Patient not taking: Reported on 07/04/2016  10/27/15   Nori Riis, PA-C      DRUG ALLERGIES: Allergies  Allergen Reactions  . Bactrim [Sulfamethoxazole-Trimethoprim]   . Darvon [Propoxyphene] Other (See Comments)    Patient states she was real weak, felt like she was going to pass out.  . Albuterol Palpitations and Other (See Comments)    Irregular heart beat, CAN TOLERATE XOPENEX  . Fentanyl Nausea And Vomiting and Rash  . Latex Rash  . Tetracyclines & Related Rash     REVIEW OF SYSTEMS: CONSTITUTIONAL: No fatigue, weakness, fever, chills, weight gain/loss, headache EYES: No blurry or double vision. ENT: No tinnitus, postnasal drip, redness or soreness of the oropharynx. RESPIRATORY: No dyspnea, cough, wheeze, hemoptysis. CARDIOVASCULAR: Positive chest pain, negative orthopnea, palpitations, syncope. GASTROINTESTINAL: No nausea, vomiting, constipation, diarrhea, abdominal pain. No hematemesis, melena or hematochezia. GENITOURINARY: No dysuria, frequency, hematuria. ENDOCRINE: No polyuria or nocturia. No heat or cold intolerance. HEMATOLOGY: No anemia, bruising, bleeding. INTEGUMENTARY: No rashes, ulcers, lesions. MUSCULOSKELETAL: No pain, arthritis, swelling, gout. NEUROLOGIC: No numbness, tingling, weakness or ataxia. No seizure-type activity. PSYCHIATRIC: No anxiety, depression, insomnia.  PHYSICAL EXAMINATION: VITAL SIGNS: Blood pressure (!) 145/69, pulse  80, temperature 98.2 F (36.8 C), temperature source Oral, resp. rate 18, height '5\' 5"'$  (1.651 m), weight 86.2 kg (190 lb), SpO2 100 %.  GENERAL: 83 y.o.-year-old white female patient, well-developed, well-nourished lying in the bed in no acute distress.  Pleasant and cooperative.   HEENT: Head atraumatic, normocephalic. Pupils equal, round, reactive to light and accommodation. No scleral icterus. Extraocular muscles intact. Oropharynx is clear. Mucus membranes moist. NECK: Supple, full range of motion. No JVD, no bruit heard. No cervical  lymphadenopathy. CHEST: Normal breath sounds bilaterally. No wheezing, rales, rhonchi or crackles. No use of accessory muscles of respiration.  No reproducible chest wall tenderness.  CARDIOVASCULAR: S1, S2 normal. No murmurs, rubs, or gallops appreciated. Cap refill <2 seconds. ABDOMEN: Soft, nontender, nondistended. No rebound, guarding, rigidity. Normoactive bowel sounds present in all four quadrants. No organomegaly or mass. EXTREMITIES:  No pedal edema, cyanosis, or clubbing. NEUROLOGIC: Cranial nerves II through XII are grossly intact with no focal sensorimotor deficit. Muscle strength 5/5 in all extremities. Sensation intact. Gait not checked. PSYCHIATRIC: The patient is alert and oriented x 3. Normal affect, mood, thought content. SKIN: Warm, dry, and intact without obvious rash, lesion, or ulcer.  LABORATORY PANEL:  CBC  Recent Labs Lab 07/04/16 1840  WBC 4.1  HGB 10.4*  HCT 30.0*  PLT 235   ----------------------------------------------------------------------------------------------------------------- Chemistries  Recent Labs Lab 07/04/16 1840  NA 135  K 5.2*  CL 104  CO2 23  GLUCOSE 147*  BUN 45*  CREATININE 1.46*  CALCIUM 9.2   ------------------------------------------------------------------------------------------------------------------ Cardiac Enzymes  Recent Labs Lab 07/04/16 2137  TROPONINI <0.03   ------------------------------------------------------------------------------------------------------------------  RADIOLOGY: Dg Chest 2 View  Result Date: 07/04/2016 CLINICAL DATA:  83 year old with acute onset of left-sided chest pain began earlier today and has progressively worsened. Personal history of metastatic right upper lobe lung cancer post radiation. EXAM: CHEST  2 VIEW COMPARISON:  PET-CT 10/29/2015, 06/05/2015 and earlier. CT chest 01/01/2015, 03/15/2013 and earlier. Chest x-rays 12/10/2013, 10/29/2012 and earlier. FINDINGS: AP erect and  lateral images were obtained. Cardiac silhouette upper normal in size for AP technique, unchanged. Thoracic aorta mildly atherosclerotic, unchanged. Masslike fibrosis involving the right perihilar region, unchanged over multiple prior chest x-rays and CTs. New streaky opacities involving the left lower lobe. Lungs otherwise clear. Right jugular Port-A-Cath tip in the lower SVC, unchanged. IMPRESSION: 1. Acute left lower lobe atelectasis and/or bronchopneumonia. 2. Stable post radiation fibrosis involving the right perihilar region accounting for the masslike opacity. Electronically Signed   By: Evangeline Dakin M.D.   On: 07/04/2016 19:15    EKG: Normal sinus rhythm at 81 bpm with normal axis and nonspecific ST-T wave changes.   IMPRESSION AND PLAN:  This is a 83 y.o. female with a history of Atrial fibrillation, COPD/asthma, coronary artery disease status post MI, diabetes, hypertension, hyperlipidemia, CVA now being admitted with: 1. Chest pain, rule out ACS. Gastritis/GERD also in the differential - Admit to observation with telemetry monitoring. - Trend troponins, check lipids and TSH. - Morphine, nitro, aspirin ordered.   -Continue Crestor, Toprol, Imdur - Check echo - Trial of Protonix IV for possible GERD - Cardiology consult requested with Dr. Humphrey Rolls -Please note patient appears to have atelectasis versus left lower lobe bronchopneumonia and for that she received Levaquin in the emergency department however patient is not exhibiting any signs or symptoms of pneumonia such as fevers, chills, cough, congestion. I have therefore discontinued antibiotics.  2. CKD, stable at baseline - Monitor BMP  3. Anemia, stable  at baseline - Monitor CBC  4. History of atrial fibrillation -Continue Toprol  5. History of COPD/asthma -May use Atrovent as needed. Patient is allergic to albuterol and Xopenex is not on formulary -Continue Singulair and Spiriva  6. History of CAD status post MI and  CVA -Continue Aggrenox  7. History of hyperlipidemia -Continue Crestor and phenyl fiber  8. History of hypertension -Continue amlodipine, benazepril, metoprolol  9. History of osteoarthritis -Continue   low back 10. History of dizziness -Continue Antivert  11. History of diabetes -Hold Capozide and Januvia -Cover with regular insulin sliding scale coverage.   Admission Status: Telemetry, observation Diet/Nutrition: Heart healthy, carb controlled Fluids: IVNS DVT Px: Lovenox, SCDs and early ambulation Code Status: Full  All the records are reviewed and case discussed with ED provider. Management plans discussed with the patient and/or family who express understanding and agree with plan of care.   TOTAL TIME TAKING CARE OF THIS PATIENT: 60 minutes.   Adaliz Dobis D.O. on 07/04/2016 at 10:29 PM Between 7am to 6pm - Pager - [phone] After 6pm go to [email or web address] - [employer] [facility] [place] [facility] Office [phone] CC: Primary care physician; Vista Mink, FNP     Note: This dictation was prepared with Dragon dictation along with smaller phrase technology. Any transcriptional errors that result from this process are unintentional.

## 2016-07-04 NOTE — ED Provider Notes (Signed)
[facility] Emergency Department Provider Note   ____________________________________________   I have reviewed the triage vital signs and the nursing notes.   HISTORY  Chief Complaint Chest Pain   History limited by: Not Limited   HPI [name] is a 83 y.o. female who presents to the emergency department today because of chest pain. It is located centrally. It started roughly 2.5 hours prior to presentation. Patient was finishing up washing dishes when it started. She described it as pressure like. It did radiate to her bilateral upper arms. The patient also felt short of breath with the pressure. No diaphoresis. The patient took two nitroglycerins that she had at home which did help relieve the pain. The patient has history of ACS and states this reminds her of her previous cardiac symptoms.   Past Medical History:  Diagnosis Date  . Arrhythmia   . Arthritis   . Asthma   . Atrial fibrillation ([place])   . Cancer ([place])    lung cancer  . COPD (chronic obstructive pulmonary disease) ([place])   . Coronary artery disease   . Depression   . Diabetes mellitus without complication ([place])   . Heart attack   . Heart disease   . Heart failure ([place])   . Heartburn   . HLD (hyperlipidemia)   . Hypertension   . Hyperthyroidism   . Kidney failure   . Sleep apnea   . Stroke [facility]     Patient Active Problem List   Diagnosis Date Noted  . Recurrent UTI 12/29/2015  . Dysuria 12/29/2015  . Acute kidney injury ([place]) 06/27/2015  . Hypoglycemia 06/27/2015  . OBSTRUCTIVE SLEEP APNEA 03/15/2007  . CORONARY ARTERY DISEASE 03/15/2007  . ASTHMA, CHRONIC OBSTRUCTIVE NOS 03/15/2007  . FIBROMYALGIA 03/15/2007    Past Surgical History:  Procedure Laterality Date  . CHOLECYSTECTOMY  1983  . EYE SURGERY  2012/2016  . THYROID SURGERY  1963  . TOTAL HIP ARTHROPLASTY Left 2008    Prior to Admission medications   Medication Sig Start Date End Date Taking? Authorizing  Provider  amLODipine-benazepril (LOTREL) 10-20 MG capsule Take 1 capsule by mouth every evening. 08/20/13   Historical Provider, MD  Kyra Searles, ALBUMIN FREE, 300 MCG/0.6ML SOSY injection Reported on 11/11/2015 09/30/15   Historical Provider, MD  Butalbital-APAP-Caffeine 50-325-40 MG capsule Take 1 capsule by mouth 4 (four) times daily as needed. Reported on 11/11/2015    Historical Provider, MD  Choline Fenofibrate (FENOFIBRIC ACID) 135 MG CPDR Take 135 mg by mouth daily. Reported on 11/24/2015 06/22/15   Historical Provider, MD  DEXILANT 60 MG capsule Take 60 mg by mouth daily. 04/27/15   Historical Provider, MD  dipyridamole-aspirin (AGGRENOX) 200-25 MG 12hr capsule Take 1 capsule by mouth 2 (two) times daily. 09/26/13   Historical Provider, MD  estradiol (ESTRACE VAGINAL) 0.1 MG/GM vaginal cream Apply 0.'5mg'$  (pea-sized amount)  just inside the vaginal introitus with a finger-tip every night for two weeks and then Monday, Wednesday and Friday nights. 10/27/15   Shannon A McGowan, PA-C  Fe Fum-FePoly-FA-Vit C-Vit B3 (INTEGRA F) 125-1 MG CAPS Take 1 capsule by mouth daily.    Historical Provider, MD  fluticasone (FLOVENT DISKUS) 50 MCG/BLIST diskus inhaler Inhale 1-2 puffs into the lungs daily as needed. For rhinitis.    Historical Provider, MD  gabapentin (NEURONTIN) 100 MG capsule Take 100 mg by mouth 2 (two) times daily. 05/26/15   Historical Provider, MD  isosorbide mononitrate (IMDUR) 60 MG 24 hr tablet Take 60  mg by mouth 2 (two) times daily. 05/26/15   Historical Provider, MD  JANUVIA 100 MG tablet Take 100 mg by mouth every evening. 06/09/15   Historical Provider, MD  levalbuterol (XOPENEX HFA) 45 MCG/ACT inhaler Inhale 1 puff into the lungs 4 (four) times daily as needed. For wheezing/shortness of breath.    Historical Provider, MD  levalbuterol Penne Lash) 1.25 MG/3ML nebulizer solution Take 1.25 mg by nebulization every 4 (four) hours as needed for wheezing or shortness of breath.    Historical Provider,  MD  levothyroxine (SYNTHROID, LEVOTHROID) 125 MCG tablet Take 125 mcg by mouth daily before breakfast. 06/09/15   Historical Provider, MD  meclizine (ANTIVERT) 25 MG tablet Take 25 mg by mouth 2 (two) times daily.    Historical Provider, MD  meloxicam (MOBIC) 7.5 MG tablet Take 7.5 mg by mouth every other day. 05/18/15   Historical Provider, MD  Meth-Hyo-M Barnett Hatter Phos-Ph Sal (URIBEL) 118 MG CAPS Take 1 capsule (118 mg total) by mouth 2 (two) times daily. 12/29/15   Cleon Gustin, MD  metoprolol succinate (TOPROL-XL) 50 MG 24 hr tablet Take 25 mg by mouth 2 (two) times daily. 03/31/15   Historical Provider, MD  montelukast (SINGULAIR) 10 MG tablet Take 10 mg by mouth daily. 06/17/15   Historical Provider, MD  nitroGLYCERIN (NITROSTAT) 0.4 MG SL tablet Place 0.4 mg under the tongue every 5 (five) minutes x 3 doses as needed. For chest pain. *if no relief call MD or go to emergency room* 04/20/15   Historical Provider, MD  OXYGEN Inhale into the lungs.    Historical Provider, MD  rosuvastatin (CRESTOR) 20 MG tablet Take 20 mg by mouth daily.  05/18/15   Historical Provider, MD  SPIRIVA HANDIHALER 18 MCG inhalation capsule Place 18 mcg into inhaler and inhale daily. 05/26/15   Historical Provider, MD  torsemide (DEMADEX) 20 MG tablet Take 10-20 mg by mouth daily as needed. For edema/fluid. 05/18/15   Historical Provider, MD  traMADol-acetaminophen Caroline Sauger) 37.5-325 MG tablet  09/10/15   Historical Provider, MD  [facility] 625 MG tablet  09/23/15   Historical Provider, MD    Allergies Bactrim [sulfamethoxazole-trimethoprim]; Darvon [propoxyphene]; Albuterol; Fentanyl; Latex; and Tetracyclines & related  Family History  Problem Relation Age of Onset  . Diabetes Other   . Hypertension Other   . Hematuria Father   . Kidney failure Father   . Kidney cancer Brother   . Kidney failure Mother   . Kidney Stones Brother     Social History Social History  Substance Use Topics  . Smoking status: Never  Smoker  . Smokeless tobacco: Never Used  . Alcohol use No    Review of Systems  Constitutional: Negative for fever. Cardiovascular: Positive for chest pain. Respiratory: Positive for shortness of breath. Gastrointestinal: Negative for abdominal pain, vomiting and diarrhea. Neurological: Negative for headaches, focal weakness or numbness.  10-point ROS otherwise negative.  ____________________________________________   PHYSICAL EXAM:  VITAL SIGNS: ED Triage Vitals  Enc Vitals Group     BP 07/04/16 1835 (!) 142/62     Pulse Rate 07/04/16 1835 81     Resp 07/04/16 1835 18     Temp 07/04/16 1835 97.8 F (36.6 C)     Temp Source 07/04/16 1835 Oral     SpO2 07/04/16 1835 100 %     Weight 07/04/16 1836 190 lb (86.2 kg)     Height 07/04/16 1836 '5\' 5"'$  (1.651 m)     Head Circumference --  Peak Flow --      Pain Score 07/04/16 1836 7   Constitutional: Alert and oriented. Well appearing and in no distress. Eyes: Conjunctivae are normal. Normal extraocular movements. ENT   Head: Normocephalic and atraumatic.   Nose: No congestion/rhinnorhea.   Mouth/Throat: Mucous membranes are moist.   Neck: No stridor. Hematological/Lymphatic/Immunilogical: No cervical lymphadenopathy. Cardiovascular: Normal rate, regular rhythm.  No murmurs, rubs, or gallops.  Respiratory: Normal respiratory effort without tachypnea nor retractions. Breath sounds are clear and equal bilaterally. No wheezes/rales/rhonchi. Gastrointestinal: Soft and non tender. No rebound. No guarding.  Genitourinary: Deferred Musculoskeletal: Normal range of motion in all extremities. No lower extremity edema. Neurologic:  Normal speech and language. No gross focal neurologic deficits are appreciated.  Skin:  Skin is warm, dry and intact. No rash noted. Psychiatric: Mood and affect are normal. Speech and behavior are normal. Patient exhibits appropriate insight and  judgment.  ____________________________________________    LABS (pertinent positives/negatives)  Labs Reviewed  CBC WITH DIFFERENTIAL/PLATELET - Abnormal; Notable for the following:       Result Value   RBC 3.37 (*)    Hemoglobin 10.4 (*)    HCT 30.0 (*)    All other components within normal limits  BASIC METABOLIC PANEL - Abnormal; Notable for the following:    Potassium 5.2 (*)    Glucose, Bld 147 (*)    BUN 45 (*)    Creatinine, Ser 1.46 (*)    GFR calc non Af Amer 31 (*)    GFR calc Af Amer 36 (*)    All other components within normal limits  CULTURE, BLOOD (ROUTINE X 2)  CULTURE, BLOOD (ROUTINE X 2)  TROPONIN I  URINALYSIS, COMPLETE (UACMP) WITH MICROSCOPIC     ____________________________________________   EKG  I, Nance Pear, attending physician, personally viewed and interpreted this EKG  EKG Time: 1833 Rate: 81 Rhythm: normal sinus rhythm Axis: normal Intervals: qtc 457 QRS: narrow, low voltage ST changes: no st elevation Impression: abnormal ekg   ____________________________________________    RADIOLOGY  CXR IMPRESSION:  1. Acute left lower lobe atelectasis and/or bronchopneumonia.  2. Stable post radiation fibrosis involving the right perihilar  region accounting for the masslike opacity.      ____________________________________________   PROCEDURES  Procedures  ____________________________________________   INITIAL IMPRESSION / ASSESSMENT AND PLAN / ED COURSE  Pertinent labs & imaging results that were available during my care of the patient were reviewed by me and considered in my medical decision making (see chart for details).  Patient presented to the emergency department today because of concerns for chest pain and shortness of breath. Patient has high-risk chest pain given history of coronary artery disease. Patient's chest x-ray did show possible pneumonia. This could explain the chest pain and shortness of breath.  Given this will plan on giving patient dose of abx for possible pneumonia. Additionally will plan on admission to the hospital for further work up and recheck of cardiac enzymes.  ____________________________________________   FINAL CLINICAL IMPRESSION(S) / ED DIAGNOSES  Final diagnoses:  Nonspecific chest pain     Note: This dictation was prepared with Dragon dictation. Any transcriptional errors that result from this process are unintentional     Nance Pear, MD 07/04/16 2022

## 2016-07-05 DIAGNOSIS — R079 Chest pain, unspecified: Secondary | ICD-10-CM | POA: Diagnosis not present

## 2016-07-05 LAB — GLUCOSE, CAPILLARY
GLUCOSE-CAPILLARY: 108 mg/dL — AB (ref 65–99)
GLUCOSE-CAPILLARY: 108 mg/dL — AB (ref 65–99)
Glucose-Capillary: 111 mg/dL — ABNORMAL HIGH (ref 65–99)
Glucose-Capillary: 128 mg/dL — ABNORMAL HIGH (ref 65–99)
Glucose-Capillary: 131 mg/dL — ABNORMAL HIGH (ref 65–99)

## 2016-07-05 LAB — TSH: TSH: 1.654 u[IU]/mL (ref 0.350–4.500)

## 2016-07-05 LAB — LIPID PANEL
CHOLESTEROL: 137 mg/dL (ref 0–200)
HDL: 35 mg/dL — AB (ref >40)
LDL Cholesterol: 36 mg/dL (ref 0–99)
TRIGLYCERIDES: 328 mg/dL — AB (ref <150)
Total CHOL/HDL Ratio: 3.9 RATIO
VLDL: 66 mg/dL — ABNORMAL HIGH (ref 0–40)

## 2016-07-05 LAB — TROPONIN I
Troponin I: <0.03 ng/mL (ref <0.03)
Troponin I: <0.03 ng/mL (ref <0.03)

## 2016-07-05 MED ORDER — ASPIRIN-DIPYRIDAMOLE ER 25-200 MG PO CP12
1.0000 | ORAL_CAPSULE | Freq: Two times a day (BID) | ORAL | Status: DC
  Administered 2016-07-05: 1 via ORAL
  Filled 2016-07-05: qty 1

## 2016-07-05 MED ORDER — ONDANSETRON HCL 4 MG/2ML IJ SOLN: 4.0000 mg | Freq: Four times a day (QID) | INTRAMUSCULAR | Status: DC | PRN

## 2016-07-05 MED ORDER — TIOTROPIUM BROMIDE MONOHYDRATE 18 MCG IN CAPS
18.0000 ug | ORAL_CAPSULE | Freq: Every day | RESPIRATORY_TRACT | Status: DC
  Administered 2016-07-05 – 2016-07-06 (×2): 18 ug via RESPIRATORY_TRACT
  Filled 2016-07-05: qty 5

## 2016-07-05 MED ORDER — BENAZEPRIL HCL 20 MG PO TABS
20.0000 mg | ORAL_TABLET | Freq: Every day | ORAL | Status: DC
  Administered 2016-07-05 – 2016-07-06 (×2): 20 mg via ORAL
  Filled 2016-07-05 (×2): qty 1

## 2016-07-05 MED ORDER — FENOFIBRATE 145 MG PO TABS
145.0000 mg | ORAL_TABLET | Freq: Every day | ORAL | Status: DC
  Administered 2016-07-05 – 2016-07-06 (×2): 145 mg via ORAL
  Filled 2016-07-05 (×2): qty 1

## 2016-07-05 MED ORDER — POLYSACCHARIDE IRON COMPLEX 150 MG PO CAPS
150.0000 mg | ORAL_CAPSULE | Freq: Every day | ORAL | Status: DC
  Administered 2016-07-05 – 2016-07-06 (×2): 150 mg via ORAL
  Filled 2016-07-05 (×2): qty 1

## 2016-07-05 MED ORDER — NITROGLYCERIN 0.4 MG SL SUBL
0.4000 mg | SUBLINGUAL_TABLET | SUBLINGUAL | Status: DC | PRN
  Administered 2016-07-05: 0.4 mg via SUBLINGUAL

## 2016-07-05 MED ORDER — ASPIRIN EC 81 MG PO TBEC
81.0000 mg | DELAYED_RELEASE_TABLET | Freq: Every day | ORAL | Status: DC
  Administered 2016-07-05: 81 mg via ORAL
  Filled 2016-07-05 (×3): qty 1

## 2016-07-05 MED ORDER — ISOSORBIDE MONONITRATE ER 60 MG PO TB24
60.0000 mg | ORAL_TABLET | Freq: Two times a day (BID) | ORAL | Status: DC
  Administered 2016-07-05 – 2016-07-06 (×3): 60 mg via ORAL
  Filled 2016-07-05 (×3): qty 1

## 2016-07-05 MED ORDER — GI COCKTAIL ~~LOC~~
30.0000 mL | Freq: Four times a day (QID) | ORAL | Status: DC | PRN
  Administered 2016-07-05 – 2016-07-06 (×5): 30 mL via ORAL
  Filled 2016-07-05 (×7): qty 30

## 2016-07-05 MED ORDER — AMLODIPINE BESYLATE 10 MG PO TABS
10.0000 mg | ORAL_TABLET | Freq: Every day | ORAL | Status: DC
  Administered 2016-07-05 – 2016-07-06 (×2): 10 mg via ORAL
  Filled 2016-07-05 (×2): qty 1

## 2016-07-05 MED ORDER — ALPRAZOLAM 0.25 MG PO TABS: 0.2500 mg | ORAL_TABLET | Freq: Two times a day (BID) | ORAL | Status: DC | PRN

## 2016-07-05 MED ORDER — ACETAMINOPHEN 325 MG PO TABS
650.0000 mg | ORAL_TABLET | ORAL | Status: DC | PRN
  Administered 2016-07-05: 650 mg via ORAL
  Filled 2016-07-05: qty 2

## 2016-07-05 MED ORDER — MORPHINE SULFATE (PF) 4 MG/ML IV SOLN
2.0000 mg | INTRAVENOUS | Status: DC | PRN
  Filled 2016-07-05: qty 1

## 2016-07-05 MED ORDER — GABAPENTIN 100 MG PO CAPS
100.0000 mg | ORAL_CAPSULE | Freq: Two times a day (BID) | ORAL | Status: DC
  Administered 2016-07-05 – 2016-07-06 (×4): 100 mg via ORAL
  Filled 2016-07-05 (×4): qty 1

## 2016-07-05 MED ORDER — INSULIN ASPART 100 UNIT/ML ~~LOC~~ SOLN: 0.0000 [IU] | Freq: Every day | SUBCUTANEOUS | Status: DC

## 2016-07-05 MED ORDER — ZOLPIDEM TARTRATE 5 MG PO TABS: 5.0000 mg | ORAL_TABLET | Freq: Every evening | ORAL | Status: DC | PRN

## 2016-07-05 MED ORDER — HYDROMORPHONE HCL 2 MG PO TABS
1.0000 mg | ORAL_TABLET | ORAL | Status: DC | PRN
  Administered 2016-07-05 (×2): 1 mg via ORAL
  Filled 2016-07-05 (×2): qty 1

## 2016-07-05 MED ORDER — HEPARIN SODIUM (PORCINE) 5000 UNIT/ML IJ SOLN
5000.0000 [IU] | Freq: Three times a day (TID) | INTRAMUSCULAR | Status: DC
  Administered 2016-07-05 – 2016-07-06 (×5): 5000 [IU] via SUBCUTANEOUS
  Filled 2016-07-05 (×5): qty 1

## 2016-07-05 MED ORDER — TORSEMIDE 20 MG PO TABS: 10.0000 mg | ORAL_TABLET | Freq: Every day | ORAL | Status: DC | PRN

## 2016-07-05 MED ORDER — INSULIN ASPART 100 UNIT/ML ~~LOC~~ SOLN
0.0000 [IU] | Freq: Three times a day (TID) | SUBCUTANEOUS | Status: DC
  Administered 2016-07-05 – 2016-07-06 (×2): 2 [IU] via SUBCUTANEOUS
  Filled 2016-07-05 (×2): qty 2

## 2016-07-05 MED ORDER — ROSUVASTATIN CALCIUM 20 MG PO TABS
20.0000 mg | ORAL_TABLET | Freq: Every day | ORAL | Status: DC
  Administered 2016-07-05 – 2016-07-06 (×2): 20 mg via ORAL
  Filled 2016-07-05 (×2): qty 1

## 2016-07-05 MED ORDER — MONTELUKAST SODIUM 10 MG PO TABS
10.0000 mg | ORAL_TABLET | Freq: Every day | ORAL | Status: DC
  Administered 2016-07-05 – 2016-07-06 (×2): 10 mg via ORAL
  Filled 2016-07-05 (×2): qty 1

## 2016-07-05 MED ORDER — LEVOTHYROXINE SODIUM 125 MCG PO TABS
125.0000 ug | ORAL_TABLET | Freq: Every day | ORAL | Status: DC
  Administered 2016-07-05 – 2016-07-06 (×2): 125 ug via ORAL
  Filled 2016-07-05 (×2): qty 1

## 2016-07-05 MED ORDER — ASPIRIN EC 81 MG PO TBEC
81.0000 mg | DELAYED_RELEASE_TABLET | Freq: Every day | ORAL | Status: DC
  Administered 2016-07-05 – 2016-07-06 (×2): 81 mg via ORAL

## 2016-07-05 MED ORDER — MECLIZINE HCL 25 MG PO TABS
25.0000 mg | ORAL_TABLET | Freq: Two times a day (BID) | ORAL | Status: DC
  Administered 2016-07-05 – 2016-07-06 (×4): 25 mg via ORAL
  Filled 2016-07-05 (×4): qty 1

## 2016-07-05 MED ORDER — SODIUM CHLORIDE 0.9 % IV SOLN
INTRAVENOUS | Status: DC
  Administered 2016-07-05: 01:00:00 via INTRAVENOUS

## 2016-07-05 MED ORDER — MELOXICAM 7.5 MG PO TABS
7.5000 mg | ORAL_TABLET | ORAL | Status: DC
  Administered 2016-07-05: 7.5 mg via ORAL
  Filled 2016-07-05: qty 1

## 2016-07-05 MED ORDER — INTEGRA F 125-1 MG PO CAPS: 1.0000 | ORAL_CAPSULE | Freq: Every day | ORAL | Status: DC

## 2016-07-05 MED ORDER — FENOFIBRIC ACID 135 MG PO CPDR: 135.0000 mg | DELAYED_RELEASE_CAPSULE | Freq: Every day | ORAL | Status: DC

## 2016-07-05 MED ORDER — RANOLAZINE ER 500 MG PO TB12
500.0000 mg | ORAL_TABLET | Freq: Two times a day (BID) | ORAL | Status: DC
  Administered 2016-07-05 – 2016-07-06 (×3): 500 mg via ORAL
  Filled 2016-07-05 (×3): qty 1

## 2016-07-05 MED ORDER — CLOPIDOGREL BISULFATE 75 MG PO TABS
75.0000 mg | ORAL_TABLET | Freq: Every day | ORAL | Status: DC
  Administered 2016-07-05 – 2016-07-06 (×2): 75 mg via ORAL
  Filled 2016-07-05 (×2): qty 1

## 2016-07-05 MED ORDER — METOPROLOL SUCCINATE ER 25 MG PO TB24
25.0000 mg | ORAL_TABLET | Freq: Two times a day (BID) | ORAL | Status: DC
  Administered 2016-07-05: 25 mg via ORAL
  Filled 2016-07-05: qty 1

## 2016-07-05 NOTE — Care Management Obs Status (Signed)
[place] NOTIFICATION   Patient Details  Name: [name] MRN: [id number] Date of Birth: July 24, 1925   Medicare Observation Status Notification Given:  Yes    Katrina Stack, RN 07/05/2016, 11:28 AM

## 2016-07-05 NOTE — Plan of Care (Signed)
Problem: Pain Managment: Goal: General experience of comfort will improve Outcome: Progressing Patient experiencing relief from pain medication.

## 2016-07-05 NOTE — Progress Notes (Signed)
[facility] [place] NAME: [name]    MR#:  [id number]  DATE OF BIRTH:  [birth year]  SUBJECTIVE:  CHIEF COMPLAINT:   Chief Complaint  Patient presents with  . Chest Pain      History of coronary artery disease and in the remote past gastric ulcer disease.  Came with chest pain which is on and off radiating to her back. Monitored on telemetry, remained stable, follow-up troponins are stable. Still continued to have on and off chest pains.   She was offered to have coronary angiogram by cardiologist but she refuses to have it this time as she is worried about her kidney function. Cardiologist offered to have CT coronary angiogram as outpatient after 2 days.  When I saw the patient she just finished her lunch and was again having the episode of chest pain. REVIEW OF SYSTEMS:  CONSTITUTIONAL: No fever, fatigue or weakness.  EYES: No blurred or double vision.  EARS, NOSE, AND THROAT: No tinnitus or ear pain.  RESPIRATORY: No cough, shortness of breath, wheezing or hemoptysis.  CARDIOVASCULAR: Positive for chest pain, no orthopnea, edema.  GASTROINTESTINAL: No nausea, vomiting, diarrhea or abdominal pain.  GENITOURINARY: No dysuria, hematuria.  ENDOCRINE: No polyuria, nocturia,  HEMATOLOGY: No anemia, easy bruising or bleeding SKIN: No rash or lesion. MUSCULOSKELETAL: No joint pain or arthritis.   NEUROLOGIC: No tingling, numbness, weakness.  PSYCHIATRY: No anxiety or depression.   ROS  DRUG ALLERGIES:   Allergies  Allergen Reactions  . Bactrim [Sulfamethoxazole-Trimethoprim]   . Darvon [Propoxyphene] Other (See Comments)    Patient states she was real weak, felt like she was going to pass out.  . Albuterol Palpitations and Other (See Comments)    Irregular heart beat, CAN TOLERATE XOPENEX  . Fentanyl Nausea And Vomiting and Rash  . Latex Rash  . Tetracyclines & Related Rash    VITALS:  Blood pressure (!) 110/40, pulse (!) 58,  temperature 97.9 F (36.6 C), temperature source Oral, resp. rate 20, height '5\' 5"'$  (1.651 m), weight 86.1 kg (189 lb 12.8 oz), SpO2 97 %.  PHYSICAL EXAMINATION:  GENERAL:  83 y.o.-year-old patient lying in the bed with no acute distress.  EYES: Pupils equal, round, reactive to light and accommodation. No scleral icterus. Extraocular muscles intact.  HEENT: Head atraumatic, normocephalic. Oropharynx and nasopharynx clear.  NECK:  Supple, no jugular venous distention. No thyroid enlargement, no tenderness.  LUNGS: Normal breath sounds bilaterally, no wheezing, rales,rhonchi or crepitation. No use of accessory muscles of respiration.  CARDIOVASCULAR: S1, S2 normal. No murmurs, rubs, or gallops.  ABDOMEN: Soft, nontender, nondistended. Bowel sounds present. No organomegaly or mass.  EXTREMITIES: No pedal edema, cyanosis, or clubbing.  NEUROLOGIC: Cranial nerves II through XII are intact. Muscle strength 5/5 in all extremities. Sensation intact. Gait not checked.  PSYCHIATRIC: The patient is alert and oriented x 3.  SKIN: No obvious rash, lesion, or ulcer.   Physical Exam LABORATORY PANEL:   CBC  Recent Labs Lab 07/04/16 1840  WBC 4.1  HGB 10.4*  HCT 30.0*  PLT 235   ------------------------------------------------------------------------------------------------------------------  Chemistries   Recent Labs Lab 07/04/16 1840  NA 135  K 5.2*  CL 104  CO2 23  GLUCOSE 147*  BUN 45*  CREATININE 1.46*  CALCIUM 9.2   ------------------------------------------------------------------------------------------------------------------  Cardiac Enzymes  Recent Labs Lab 07/05/16 0811 07/05/16 1322  TROPONINI <0.03 <0.03   ------------------------------------------------------------------------------------------------------------------  RADIOLOGY:  Dg Chest 2 View  Result Date: 07/04/2016  CLINICAL DATA:  83 year old with acute onset of left-sided chest pain began earlier today  and has progressively worsened. Personal history of metastatic right upper lobe lung cancer post radiation. EXAM: CHEST  2 VIEW COMPARISON:  PET-CT 10/29/2015, 06/05/2015 and earlier. CT chest 01/01/2015, 03/15/2013 and earlier. Chest x-rays 12/10/2013, 10/29/2012 and earlier. FINDINGS: AP erect and lateral images were obtained. Cardiac silhouette upper normal in size for AP technique, unchanged. Thoracic aorta mildly atherosclerotic, unchanged. Masslike fibrosis involving the right perihilar region, unchanged over multiple prior chest x-rays and CTs. New streaky opacities involving the left lower lobe. Lungs otherwise clear. Right jugular Port-A-Cath tip in the lower SVC, unchanged. IMPRESSION: 1. Acute left lower lobe atelectasis and/or bronchopneumonia. 2. Stable post radiation fibrosis involving the right perihilar region accounting for the masslike opacity. Electronically Signed   By: Evangeline Dakin M.D.   On: 07/04/2016 19:15    ASSESSMENT AND PLAN:   Active Problems:   Chest pain, rule out acute myocardial infarction  1. Chest pain, rule out ACS. Gastritis/GERD also in the differential -  telemetry monitoring. - Trend troponins remained stable.- , checked lipids and TSH. - nitro, aspirin ordered.   -Continue Crestor, Toprol, Imdur - Check echo - Trial of Protonix IV for possible GERD - Cardiology consult requested with Dr. Humphrey Rolls -Please note patient appears to have atelectasis versus left lower lobe bronchopneumonia and for that she received Levaquin in the emergency department however patient is not exhibiting any signs or symptoms of pneumonia such as fevers, chills, cough, congestion. I have therefore discontinued antibiotics. - Cardiology saw the patient , suggested to have coronary angiogram but patient refused as she is worried about her kidney function and her troponins are negative.. So cardiology suggested to start on Ranexa, and change her from Aggrenox to aspirin plus Plavix. -  She says she cannot tolerate morphine or oxycodone but she had tolerated Dilaudid without any trouble in the past, we will start on Dilaudid for the chest pain as aspirin and Tylenol is not working for her for now.  2. CKD, stable at baseline - Monitor BMP  3. Anemia, stable at baseline - Monitor CBC  4. History of atrial fibrillation -Continue Toprol  5. History of COPD/asthma -May use Atrovent as needed. Patient is allergic to albuterol and Xopenex is not on formulary -Continue Singulair and Spiriva  6. History of CAD status post MI and CVA -Continue ASSA+ plavix.  7. History of hyperlipidemia -Continue Crestor and phenyl fiber  8. History of hypertension -Continue amlodipine, benazepril, metoprolol  9. History of osteoarthritis -Continue   low back 10. History of dizziness -Continue Antivert  11. History of diabetes -Hold Capozide and Januvia -Cover with regular insulin sliding scale coverage.       All the records are reviewed and case discussed with Care Management/Social Workerr. Management plans discussed with the patient, family and they are in agreement.  CODE STATUS: full  TOTAL TIME TAKING CARE OF THIS PATIENT: 35 minutes.     POSSIBLE D/C IN 1-2 DAYS, DEPENDING ON CLINICAL CONDITION.   Vaughan Basta M.D on 07/05/2016   Between 7am to 6pm - Pager - [phone]  After 6pm go to [email or web address] - password [email or web address] [facility]  Office  [phone]  CC: Primary care physician; Vista Mink, FNP  Note: This dictation was prepared with Dragon dictation along with smaller phrase technology. Any transcriptional errors that result from this process are unintentional.

## 2016-07-05 NOTE — Progress Notes (Signed)
[name] is a 83 y.o. female  [id number]  Primary Cardiologist: Neoma Laming Reason for Consultation: chest pain  HPI: this 83 year old white female with history of atrial fibrillation COPD coronary artery disease hypertensionand CVA as well as cancerof the lungs presented to the hospital with chest pain. Chest pain is still going on intermittently but is reluctant to have cardiac catheterization.   Review of Systems: o orthopnea PND or leg swelling   Past Medical History:  Diagnosis Date  . Arrhythmia   . Arthritis   . Asthma   . Atrial fibrillation ([place])   . Cancer ([place])    lung cancer  . COPD (chronic obstructive pulmonary disease) ([place])   . Coronary artery disease   . Depression   . Diabetes mellitus without complication ([place])   . Heart attack   . Heart disease   . Heart failure ([place])   . Heartburn   . HLD (hyperlipidemia)   . Hypertension   . Hyperthyroidism   . Kidney failure   . Sleep apnea   . Stroke [facility])     Medications Prior to Admission  Medication Sig Dispense Refill  . amLODipine-benazepril (LOTREL) 10-20 MG capsule Take 1 capsule by mouth every evening.    . dipyridamole-aspirin (AGGRENOX) 200-25 MG 12hr capsule Take 1 capsule by mouth 2 (two) times daily.    . Fe Fum-FePoly-FA-Vit C-Vit B3 (INTEGRA F) 125-1 MG CAPS Take 1 capsule by mouth daily.    Marland Kitchen gabapentin (NEURONTIN) 100 MG capsule Take 100 mg by mouth 2 (two) times daily.    Marland Kitchen glipiZIDE (GLUCOTROL XL) 10 MG 24 hr tablet Take 10 mg by mouth daily with breakfast.    . isosorbide mononitrate (IMDUR) 60 MG 24 hr tablet Take 60 mg by mouth 2 (two) times daily.    Marland Kitchen JANUVIA 100 MG tablet Take 100 mg by mouth every evening.    . levalbuterol (XOPENEX HFA) 45 MCG/ACT inhaler Inhale 1 puff into the lungs 4 (four) times daily as needed. For wheezing/shortness of breath.    . levalbuterol (XOPENEX) 1.25 MG/3ML nebulizer solution Take 1.25 mg by nebulization every 4 (four) hours as needed for wheezing  or shortness of breath.    . levothyroxine (SYNTHROID, LEVOTHROID) 125 MCG tablet Take 125 mcg by mouth daily before breakfast.    . meclizine (ANTIVERT) 25 MG tablet Take 25 mg by mouth 2 (two) times daily.    . meloxicam (MOBIC) 7.5 MG tablet Take 7.5 mg by mouth every other day.    . metoprolol succinate (TOPROL-XL) 50 MG 24 hr tablet Take 25 mg by mouth 2 (two) times daily.    . montelukast (SINGULAIR) 10 MG tablet Take 10 mg by mouth daily.    . rosuvastatin (CRESTOR) 20 MG tablet Take 20 mg by mouth daily.     Marland Kitchen SPIRIVA HANDIHALER 18 MCG inhalation capsule Place 18 mcg into inhaler and inhale daily.    Marland Kitchen torsemide (DEMADEX) 20 MG tablet Take 10-20 mg by mouth daily as needed. For edema/fluid.    . Butalbital-APAP-Caffeine 50-325-40 MG capsule Take 1 capsule by mouth 4 (four) times daily as needed. Reported on 11/11/2015    . Choline Fenofibrate (FENOFIBRIC ACID) 135 MG CPDR Take 135 mg by mouth daily. Reported on 11/24/2015    . estradiol (ESTRACE VAGINAL) 0.1 MG/GM vaginal cream Apply 0.'5mg'$  (pea-sized amount)  just inside the vaginal introitus with a finger-tip every night for two weeks and then Monday, Wednesday and Friday nights. (Patient  not taking: Reported on 07/04/2016) 30 g 12     . amLODipine  10 mg Oral Daily  . aspirin EC  81 mg Oral Daily  . benazepril  20 mg Oral Daily  . dipyridamole-aspirin  1 capsule Oral BID  . fenofibrate  145 mg Oral Daily  . gabapentin  100 mg Oral BID  . heparin  5,000 Units Subcutaneous Q8H  . insulin aspart  0-15 Units Subcutaneous TID WC  . insulin aspart  0-5 Units Subcutaneous QHS  . iron polysaccharides  150 mg Oral Daily  . isosorbide mononitrate  60 mg Oral BID  . levothyroxine  125 mcg Oral QAC breakfast  . meclizine  25 mg Oral BID  . meloxicam  7.5 mg Oral QODAY  . metoprolol succinate  25 mg Oral BID  . montelukast  10 mg Oral Daily  . pantoprazole (PROTONIX) IV  40 mg Intravenous Q24H  . rosuvastatin  20 mg Oral Daily  . tiotropium   18 mcg Inhalation Daily    Infusions: . sodium chloride 75 mL/hr at 07/05/16 0048    Allergies  Allergen Reactions  . Bactrim [Sulfamethoxazole-Trimethoprim]   . Darvon [Propoxyphene] Other (See Comments)    Patient states she was real weak, felt like she was going to pass out.  . Albuterol Palpitations and Other (See Comments)    Irregular heart beat, CAN TOLERATE XOPENEX  . Fentanyl Nausea And Vomiting and Rash  . Latex Rash  . Tetracyclines & Related Rash    Social History   Social History  . Marital status: Widowed    Spouse name: N/A  . Number of children: N/A  . Years of education: N/A   Occupational History  . Not on file.   Social History Main Topics  . Smoking status: Never Smoker  . Smokeless tobacco: Never Used  . Alcohol use No  . Drug use: No  . Sexual activity: Not on file   Other Topics Concern  . Not on file   Social History Narrative  . No narrative on file    Family History  Problem Relation Age of Onset  . Diabetes Other   . Hypertension Other   . Hematuria Father   . Kidney failure Father   . Kidney cancer Brother   . Kidney failure Mother   . Kidney Stones Brother     PHYSICAL EXAM: Vitals:   07/05/16 0547 07/05/16 0835  BP: (!) 107/45 (!) 122/57  Pulse: 62 66  Resp:  20  Temp:  98.5 F (36.9 C)     Intake/Output Summary (Last 24 hours) at 07/05/16 0902 Last data filed at 07/05/16 0455  Gross per 24 hour  Intake              500 ml  Output              300 ml  Net              200 ml    General:  Well appearing. No respiratory difficulty HEENT: normal Neck: supple. no JVD. Carotids 2+ bilat; no bruits. No lymphadenopathy or thryomegaly appreciated. Cor: PMI nondisplaced. Regular rate & rhythm. No rubs, gallops or murmurs. Lungs: clear Abdomen: soft, nontender, nondistended. No hepatosplenomegaly. No bruits or masses. Good bowel sounds. Extremities: no cyanosis, clubbing, rash, edema Neuro: alert & oriented x 3,  cranial nerves grossly intact. moves all 4 extremities w/o difficulty. Affect pleasant.  ECG: normal sinus rhythm with nonspecific ST-T changes  Results  for orders placed or performed during the hospital encounter of 07/04/16 (from the past 24 hour(s))  CBC with Differential     Status: Abnormal   Collection Time: 07/04/16  6:40 PM  Result Value Ref Range   WBC 4.1 3.6 - 11.0 K/uL   RBC 3.37 (L) 3.80 - 5.20 MIL/uL   Hemoglobin 10.4 (L) 12.0 - 16.0 g/dL   HCT 30.0 (L) 35.0 - 47.0 %   MCV 89.1 80.0 - 100.0 fL   MCH 30.8 26.0 - 34.0 pg   MCHC 34.6 32.0 - 36.0 g/dL   RDW 13.4 11.5 - 14.5 %   Platelets 235 150 - 440 K/uL   Neutrophils Relative % 55 %   Neutro Abs 2.3 1.4 - 6.5 K/uL   Lymphocytes Relative 29 %   Lymphs Abs 1.2 1.0 - 3.6 K/uL   Monocytes Relative 13 %   Monocytes Absolute 0.5 0.2 - 0.9 K/uL   Eosinophils Relative 2 %   Eosinophils Absolute 0.1 0 - 0.7 K/uL   Basophils Relative 1 %   Basophils Absolute 0.0 0 - 0.1 K/uL  Basic metabolic panel     Status: Abnormal   Collection Time: 07/04/16  6:40 PM  Result Value Ref Range   Sodium 135 135 - 145 mmol/L   Potassium 5.2 (H) 3.5 - 5.1 mmol/L   Chloride 104 101 - 111 mmol/L   CO2 23 22 - 32 mmol/L   Glucose, Bld 147 (H) 65 - 99 mg/dL   BUN 45 (H) 6 - 20 mg/dL   Creatinine, Ser 1.46 (H) 0.44 - 1.00 mg/dL   Calcium 9.2 8.9 - 10.3 mg/dL   GFR calc non Af Amer 31 (L) >60 mL/min   GFR calc Af Amer 36 (L) >60 mL/min   Anion gap 8 5 - 15  Troponin I     Status: None   Collection Time: 07/04/16  6:40 PM  Result Value Ref Range   Troponin I <0.03 <0.03 ng/mL  Urinalysis, Complete w Microscopic     Status: Abnormal   Collection Time: 07/04/16  8:17 PM  Result Value Ref Range   Color, Urine STRAW (A) YELLOW   APPearance CLEAR (A) CLEAR   Specific Gravity, Urine 1.006 1.005 - 1.030   pH 5.0 5.0 - 8.0   Glucose, UA NEGATIVE NEGATIVE mg/dL   Hgb urine dipstick NEGATIVE NEGATIVE   Bilirubin Urine NEGATIVE NEGATIVE    Ketones, ur NEGATIVE NEGATIVE mg/dL   Protein, ur NEGATIVE NEGATIVE mg/dL   Nitrite NEGATIVE NEGATIVE   Leukocytes, UA NEGATIVE NEGATIVE   RBC / HPF NONE SEEN 0 - 5 RBC/hpf   WBC, UA NONE SEEN 0 - 5 WBC/hpf   Bacteria, UA NONE SEEN NONE SEEN   Squamous Epithelial / LPF 0-5 (A) NONE SEEN   Mucous PRESENT    Hyaline Casts, UA PRESENT   Blood culture (routine x 2)     Status: None (Preliminary result)   Collection Time: 07/04/16  8:40 PM  Result Value Ref Range   Specimen Description BLOOD LEFT AC    Special Requests BOTTLES DRAWN AEROBIC AND ANAEROBIC BCAV    Culture NO GROWTH < 12 HOURS    Report Status PENDING   Blood culture (routine x 2)     Status: None (Preliminary result)   Collection Time: 07/04/16  8:40 PM  Result Value Ref Range   Specimen Description BLOOD RIGHT HAND    Special Requests BOTTLES DRAWN AEROBIC AND ANAEROBIC BCHV  Culture NO GROWTH < 12 HOURS    Report Status PENDING   Troponin I     Status: None   Collection Time: 07/04/16  9:37 PM  Result Value Ref Range   Troponin I <0.03 <0.03 ng/mL  TSH     Status: None   Collection Time: 07/05/16 12:08 AM  Result Value Ref Range   TSH 1.654 0.350 - 4.500 uIU/mL  Glucose, capillary     Status: Abnormal   Collection Time: 07/05/16 12:37 AM  Result Value Ref Range   Glucose-Capillary 128 (H) 65 - 99 mg/dL  Troponin I     Status: None   Collection Time: 07/05/16  1:27 AM  Result Value Ref Range   Troponin I <0.03 <0.03 ng/mL  Glucose, capillary     Status: Abnormal   Collection Time: 07/05/16  7:35 AM  Result Value Ref Range   Glucose-Capillary 108 (H) 65 - 99 mg/dL  Troponin I (q 6hr x 3)     Status: None   Collection Time: 07/05/16  8:11 AM  Result Value Ref Range   Troponin I <0.03 <0.03 ng/mL  Lipid panel     Status: Abnormal   Collection Time: 07/05/16  8:11 AM  Result Value Ref Range   Cholesterol 137 0 - 200 mg/dL   Triglycerides 328 (H) <150 mg/dL   HDL 35 (L) >40 mg/dL   Total CHOL/HDL Ratio  3.9 RATIO   VLDL 66 (H) 0 - 40 mg/dL   LDL Cholesterol 36 0 - 99 mg/dL   Dg Chest 2 View  Result Date: 07/04/2016 CLINICAL DATA:  83 year old with acute onset of left-sided chest pain began earlier today and has progressively worsened. Personal history of metastatic right upper lobe lung cancer post radiation. EXAM: CHEST  2 VIEW COMPARISON:  PET-CT 10/29/2015, 06/05/2015 and earlier. CT chest 01/01/2015, 03/15/2013 and earlier. Chest x-rays 12/10/2013, 10/29/2012 and earlier. FINDINGS: AP erect and lateral images were obtained. Cardiac silhouette upper normal in size for AP technique, unchanged. Thoracic aorta mildly atherosclerotic, unchanged. Masslike fibrosis involving the right perihilar region, unchanged over multiple prior chest x-rays and CTs. New streaky opacities involving the left lower lobe. Lungs otherwise clear. Right jugular Port-A-Cath tip in the lower SVC, unchanged. IMPRESSION: 1. Acute left lower lobe atelectasis and/or bronchopneumonia. 2. Stable post radiation fibrosis involving the right perihilar region accounting for the masslike opacity. Electronically Signed   By: Evangeline Dakin M.D.   On: 07/04/2016 19:15     ASSESSMENT AND PLAN: chest pain with MI being ruled out and patient reluctant to have cardiac catheterization advise the patient being put on Plavix and isosorbide and discharged with follow-up this Thursdayand will do CTA coronaries.  Areonna Bran A

## 2016-07-06 ENCOUNTER — Observation Stay
Admit: 2016-07-06 | Discharge: 2016-07-06 | Disposition: A | Discharge status: Home / Self Care | Payer: Medicare Other | Attending: Family Medicine | Admitting: Family Medicine

## 2016-07-06 DIAGNOSIS — R079 Chest pain, unspecified: Secondary | ICD-10-CM | POA: Diagnosis not present

## 2016-07-06 LAB — GLUCOSE, CAPILLARY
GLUCOSE-CAPILLARY: 130 mg/dL — AB (ref 65–99)
Glucose-Capillary: 108 mg/dL — ABNORMAL HIGH (ref 65–99)

## 2016-07-06 MED ORDER — NITROGLYCERIN 0.4 MG SL SUBL: 0.4000 mg | SUBLINGUAL_TABLET | SUBLINGUAL | 12 refills | Status: DC | PRN

## 2016-07-06 MED ORDER — HYDROMORPHONE HCL 2 MG PO TABS: 1.0000 mg | ORAL_TABLET | Freq: Four times a day (QID) | ORAL | 0 refills | Status: DC | PRN

## 2016-07-06 MED ORDER — PANTOPRAZOLE SODIUM 40 MG PO TBEC
40.0000 mg | DELAYED_RELEASE_TABLET | Freq: Every day | ORAL | Status: DC
  Administered 2016-07-06: 40 mg via ORAL
  Filled 2016-07-06: qty 1

## 2016-07-06 MED ORDER — PANTOPRAZOLE SODIUM 40 MG PO TBEC: 40.0000 mg | DELAYED_RELEASE_TABLET | Freq: Every day | ORAL | 0 refills | Status: DC

## 2016-07-06 MED ORDER — METOPROLOL SUCCINATE ER 25 MG PO TB24
25.0000 mg | ORAL_TABLET | Freq: Every day | ORAL | Status: DC
  Administered 2016-07-06: 25 mg via ORAL
  Filled 2016-07-06: qty 1

## 2016-07-06 MED ORDER — RANOLAZINE ER 500 MG PO TB12
500.0000 mg | ORAL_TABLET | Freq: Two times a day (BID) | ORAL | 0 refills | Status: DC
Start: 2016-07-06 — End: 2016-09-05

## 2016-07-06 MED ORDER — POLYSACCHARIDE IRON COMPLEX 150 MG PO CAPS: 150.0000 mg | ORAL_CAPSULE | Freq: Every day | ORAL | 0 refills | Status: DC

## 2016-07-06 MED ORDER — CLOPIDOGREL BISULFATE 75 MG PO TABS: 75.0000 mg | ORAL_TABLET | Freq: Every day | ORAL | 0 refills | Status: DC

## 2016-07-06 MED ORDER — ALPRAZOLAM 0.25 MG PO TABS: 0.2500 mg | ORAL_TABLET | Freq: Two times a day (BID) | ORAL | 0 refills | Status: DC | PRN

## 2016-07-06 MED ORDER — ASPIRIN 81 MG PO TBEC: 81.0000 mg | DELAYED_RELEASE_TABLET | Freq: Every day | ORAL | 0 refills | Status: DC

## 2016-07-06 MED ORDER — GI COCKTAIL ~~LOC~~: 30.0000 mL | Freq: Three times a day (TID) | ORAL | 0 refills | Status: DC | PRN

## 2016-07-06 NOTE — Progress Notes (Signed)
SUBJECTIVE: 83yo white female with a history CAD, CKD, and metastatic right upper lobe lung cancer post radiation sitting up in bed and resting comfortably. She reports intermittent chest pain that was so intense yesterday she described it has shooting right through her chest into her back. She was given dilauded which evntually gave her some relief and allowed her to rest, although she is still uncomfortable.   She was due for a PET scan last month and expresses concern about the status of her cancer which showed minimal activity at her last scan in June 2017.    Vitals:   07/05/16 2015 07/05/16 2200 07/05/16 2203 07/06/16 0432  BP: (!) 106/41 (!) 111/38 (!) 116/45 (!) 113/54  Pulse: (!) 49 (!) 49  (!) 50  Resp:    16  Temp:      TempSrc:      SpO2:    98%  Weight:      Height:        Intake/Output Summary (Last 24 hours) at 07/06/16 0910 Last data filed at 07/06/16 0431  Gross per 24 hour  Intake             1440 ml  Output              700 ml  Net              740 ml    LABS: Basic Metabolic Panel:  Recent Labs  07/04/16 1840  NA 135  K 5.2*  CL 104  CO2 23  GLUCOSE 147*  BUN 45*  CREATININE 1.46*  CALCIUM 9.2   Liver Function Tests: No results for input(s): AST, ALT, ALKPHOS, BILITOT, PROT, ALBUMIN in the last 72 hours. No results for input(s): LIPASE, AMYLASE in the last 72 hours. CBC:  Recent Labs  07/04/16 1840  WBC 4.1  NEUTROABS 2.3  HGB 10.4*  HCT 30.0*  MCV 89.1  PLT 235   Cardiac Enzymes:  Recent Labs  07/05/16 0127 07/05/16 0811 07/05/16 1322  TROPONINI <0.03 <0.03 <0.03   BNP: Invalid input(s): POCBNP D-Dimer: No results for input(s): DDIMER in the last 72 hours. Hemoglobin A1C: No results for input(s): HGBA1C in the last 72 hours. Fasting Lipid Panel:  Recent Labs  07/05/16 0811  CHOL 137  HDL 35*  LDLCALC 36  TRIG 328*  CHOLHDL 3.9   Thyroid Function Tests:  Recent Labs  07/05/16 0008  TSH 1.654   Anemia  Panel: No results for input(s): VITAMINB12, FOLATE, FERRITIN, TIBC, IRON, RETICCTPCT in the last 72 hours.   PHYSICAL EXAM General: Well developed, well nourished, in no acute distress HEENT:  Normocephalic and atramatic Neck:  No JVD.  Lungs: Clear bilaterally to auscultation and percussion. Heart: HRRR . Normal S1 and S2 without gallops or murmurs.  Abdomen: Bowel sounds are positive, abdomen soft and non-tender  Msk:  Back normal, normal gait. Normal strength and tone for age. Extremities: No clubbing, cyanosis or edema.   Neuro: Alert and oriented X 3. Psych:  Good affect, responds appropriately  TELEMETRY: Normal Sinus Rhythm   ASSESSMENT AND PLAN: Persistent chest pain with negative Troponins x 4. History of CAD, CKD and lung cancer. She is 7 months post last PET scan which showed minimal cancer activity at that time.  Awaiting results of echo ordered yesterday by Dr. Ara Kussmaul. Pt is hesitant to pursue cardiac cath due to her history of CKD and poor response to contrast. She states she will consider cardiac cath if echo  results shows changes.    Continue to monitor chest pain, EKG, and complete echo. Consider non-cardiac work-up for chest pain.   Active Problems:   Chest pain, rule out acute myocardial infarction    Jake Bathe, NP-C 07/06/2016 9:10 AM

## 2016-07-06 NOTE — Progress Notes (Signed)
*  PRELIMINARY RESULTS* Echocardiogram 2D Echocardiogram has been performed.  Sherrie Sport 07/06/2016, 3:33 PM

## 2016-07-06 NOTE — Care Management (Signed)
Referral for home health nursing for skilled observation. No agency preference.  Referral called to [facility] and accepted.

## 2016-07-06 NOTE — Progress Notes (Signed)
Patient qualified for IV to PO transition per IV to PO policy. Will transition patient to PO Protonix  Therapy per policy.   Larene Beach, PharmD

## 2016-07-06 NOTE — Discharge Summary (Signed)
[facility] NAME: [name]    MR#:  [id number]  DATE OF BIRTH:  03/03/1926  DATE OF ADMISSION:  07/04/2016 ADMITTING PHYSICIAN: Harvie Bridge, DO  DATE OF DISCHARGE: 07/06/2016  PRIMARY CARE PHYSICIAN: Vista Mink, FNP    ADMISSION DIAGNOSIS:  Nonspecific chest pain [R07.9]  DISCHARGE DIAGNOSIS:  Active Problems:   Chest pain, rule out acute myocardial infarction   Chronic kidney disease  SECONDARY DIAGNOSIS:   Past Medical History:  Diagnosis Date  . Arrhythmia   . Arthritis   . Asthma   . Atrial fibrillation ([place])   . Cancer ([place])    lung cancer  . COPD (chronic obstructive pulmonary disease) ([place])   . Coronary artery disease   . Depression   . Diabetes mellitus without complication ([place])   . Heart attack   . Heart disease   . Heart failure ([place])   . Heartburn   . HLD (hyperlipidemia)   . Hypertension   . Hyperthyroidism   . Kidney failure   . Sleep apnea   . Stroke [facility])     HOSPITAL COURSE:   1. Chest pain, rule out ACS. Gastritis/GERD also in the differential -  telemetry monitoring. - Trend troponins remained stable.- , checked lipids and TSH. - nitro, aspirin ordered.  -Continue Crestor, Toprol, Imdur - Check echo - Trial of Protonix IV for possible GERD - Cardiology consult requested with Dr. Humphrey Rolls -Please note patient appears to have atelectasis versus left lower lobe bronchopneumonia and for that she received Levaquin in the emergency department however patient is not exhibiting any signs or symptoms of pneumonia such as fevers, chills, cough, congestion. I have therefore discontinued antibiotics. - Cardiology saw the patient , suggested to have coronary angiogram but patient refused as she is worried about her kidney function and her troponins are negative.. So cardiology suggested to start on Ranexa, and change her from Aggrenox to aspirin plus Plavix. - She says she  cannot tolerate morphine or oxycodone but she had tolerated Dilaudid without any trouble in the past, we will start on Dilaudid for the chest pain as aspirin and Tylenol is not working for her for now.   As patient and her family are worried about renal damage secondary to cardiac catheterization, cardiologist came with an option of doing coronary angiogram with CT scan as outpatient. Patient and her family agree to that decision. I explained them about possibility of having blockages in the heart or she could have some other reasons for her chest pain like gastritis or muscular pain.   Without Doing further workup we could not rule out her cardiac reasons for the chest pain but as they would not be any further workup was done in hospital and her pain is getting better by getting GI cocktail and oral Dilaudid and ranexa, she can be discharged home and Dr. Humphrey Rolls can see her tomorrow in the office to have CT coronary angiogram done.  2. CKD, stable at baseline - Monitor BMP  3. Anemia, stable at baseline - Monitor CBC  4. History of atrial fibrillation - She had first-degree AV block and some bradycardia episodes in hospital also stop metoprolol.  5. History of COPD/asthma -May use Atrovent as needed. Patient is allergic to albuterol and Xopenex is not on formulary -Continue Singulair and Spiriva  6. History of CAD status post MI and CVA -Continue ASA+ plavix.  7. History of hyperlipidemia -Continue Crestor and phenyl fiber  8. History of hypertension -Continue amlodipine, benazepril, metoprolol  9. History of osteoarthritis -Continue  low back 10. History of dizziness -Continue Antivert  11. History of diabetes -resume glipizide -Cover with regular insulin sliding scale coverage.   DISCHARGE CONDITIONS:   Stable.  CONSULTS OBTAINED:  Treatment Team:  Dionisio David, MD  DRUG ALLERGIES:   Allergies  Allergen Reactions  . Bactrim [Sulfamethoxazole-Trimethoprim]    . Darvon [Propoxyphene] Other (See Comments)    Patient states she was real weak, felt like she was going to pass out.  . Albuterol Palpitations and Other (See Comments)    Irregular heart beat, CAN TOLERATE XOPENEX  . Fentanyl Nausea And Vomiting and Rash  . Latex Rash  . Tetracyclines & Related Rash    DISCHARGE MEDICATIONS:   Current Discharge Medication List    START taking these medications   Details  ALPRAZolam (XANAX) 0.25 MG tablet Take 1 tablet (0.25 mg total) by mouth 2 (two) times daily as needed for anxiety. Qty: 20 tablet, Refills: 0    Alum & Mag Hydroxide-Simeth (GI COCKTAIL) SUSP suspension Take 30 mLs by mouth 3 (three) times daily as needed for indigestion (or chest pain). Shake well. Qty: 90 mL, Refills: 0    aspirin EC 81 MG EC tablet Take 1 tablet (81 mg total) by mouth daily. Qty: 30 tablet, Refills: 0    clopidogrel (PLAVIX) 75 MG tablet Take 1 tablet (75 mg total) by mouth daily. Qty: 30 tablet, Refills: 0    HYDROmorphone (DILAUDID) 2 MG tablet Take 0.5 tablets (1 mg total) by mouth every 6 (six) hours as needed for severe pain. Qty: 10 tablet, Refills: 0    iron polysaccharides (NIFEREX) 150 MG capsule Take 1 capsule (150 mg total) by mouth daily. Qty: 30 capsule, Refills: 0    nitroGLYCERIN (NITROSTAT) 0.4 MG SL tablet Place 1 tablet (0.4 mg total) under the tongue every 5 (five) minutes as needed for chest pain. Qty: 10 tablet, Refills: 12    pantoprazole (PROTONIX) 40 MG tablet Take 1 tablet (40 mg total) by mouth daily. Qty: 30 tablet, Refills: 0    ranolazine (RANEXA) 500 MG 12 hr tablet Take 1 tablet (500 mg total) by mouth 2 (two) times daily. Qty: 60 tablet, Refills: 0      CONTINUE these medications which have NOT CHANGED   Details  amLODipine-benazepril (LOTREL) 10-20 MG capsule Take 1 capsule by mouth every evening.    Fe Fum-FePoly-FA-Vit C-Vit B3 (INTEGRA F) 125-1 MG CAPS Take 1 capsule by mouth daily.    gabapentin  (NEURONTIN) 100 MG capsule Take 100 mg by mouth 2 (two) times daily.    glipiZIDE (GLUCOTROL XL) 10 MG 24 hr tablet Take 10 mg by mouth daily with breakfast.    isosorbide mononitrate (IMDUR) 60 MG 24 hr tablet Take 60 mg by mouth 2 (two) times daily.    levalbuterol (XOPENEX HFA) 45 MCG/ACT inhaler Inhale 1 puff into the lungs 4 (four) times daily as needed. For wheezing/shortness of breath.    levalbuterol (XOPENEX) 1.25 MG/3ML nebulizer solution Take 1.25 mg by nebulization every 4 (four) hours as needed for wheezing or shortness of breath.    levothyroxine (SYNTHROID, LEVOTHROID) 125 MCG tablet Take 125 mcg by mouth daily before breakfast.    meclizine (ANTIVERT) 25 MG tablet Take 25 mg by mouth 2 (two) times daily.    meloxicam (MOBIC) 7.5 MG tablet Take 7.5 mg by mouth every other day.    montelukast (SINGULAIR) 10  MG tablet Take 10 mg by mouth daily.    rosuvastatin (CRESTOR) 20 MG tablet Take 20 mg by mouth daily.     SPIRIVA HANDIHALER 18 MCG inhalation capsule Place 18 mcg into inhaler and inhale daily.    torsemide (DEMADEX) 20 MG tablet Take 10-20 mg by mouth daily as needed. For edema/fluid.    Butalbital-APAP-Caffeine 50-325-40 MG capsule Take 1 capsule by mouth 4 (four) times daily as needed. Reported on 11/11/2015    Choline Fenofibrate (FENOFIBRIC ACID) 135 MG CPDR Take 135 mg by mouth daily. Reported on 11/24/2015    estradiol (ESTRACE VAGINAL) 0.1 MG/GM vaginal cream Apply 0.'5mg'$  (pea-sized amount)  just inside the vaginal introitus with a finger-tip every night for two weeks and then Monday, Wednesday and Friday nights. Qty: 30 g, Refills: 12   Associated Diagnoses: Vaginal atrophy      STOP taking these medications     dipyridamole-aspirin (AGGRENOX) 200-25 MG 12hr capsule      JANUVIA 100 MG tablet      metoprolol succinate (TOPROL-XL) 50 MG 24 hr tablet          DISCHARGE INSTRUCTIONS:    Follow-up with cardiology clinic  If you experience  worsening of your admission symptoms, develop shortness of breath, life threatening emergency, suicidal or homicidal thoughts you must seek medical attention immediately by calling 911 or calling your MD immediately  if symptoms less severe.  You Must read complete instructions/literature along with all the possible adverse reactions/side effects for all the Medicines you take and that have been prescribed to you. Take any new Medicines after you have completely understood and accept all the possible adverse reactions/side effects.   Please note  You were cared for by a hospitalist during your hospital stay. If you have any questions about your discharge medications or the care you received while you were in the hospital after you are discharged, you can call the unit and asked to speak with the hospitalist on call if the hospitalist that took care of you is not available. Once you are discharged, your primary care physician will handle any further medical issues. Please note that NO REFILLS for any discharge medications will be authorized once you are discharged, as it is imperative that you return to your primary care physician (or establish a relationship with a primary care physician if you do not have one) for your aftercare needs so that they can reassess your need for medications and monitor your lab values.    Today   CHIEF COMPLAINT:   Chief Complaint  Patient presents with  . Chest Pain    HISTORY OF PRESENT ILLNESS:  [name]  is a 83 y.o. female with a known history of Atrial fibrillation, COPD/asthma, coronary artery disease status post MI, diabetes, hypertension, hyperlipidemia, CVA presents to the emergency department for evaluation of chest pain.  Patient was in a usual state of health until today when she describes sudden onset of burning midsternal chest pain that radiated to the right and left chest as well as down both arms. Pain was associated with shortness of breath but  she denied nausea, vomiting, diaphoresis, lightheadedness, palpitations. Patient states that she has had this similar type of pain in the past when she has had heart attacks. She reports generally that she has been feeling unwell for the past year or so since her hospitalization in January 2017. She has suffered with multiple urinary tract infections over the past year and has suffered from worsening  deconditioning after each infection.  Otherwise there has been no change in status. Patient has been taking medication as prescribed and there has been no recent change in medication or diet.  There has been no recent illness, travel or sick contacts.    Patient denies fevers/chills, cough, congestion, weakness, dizziness, shortness of breath, N/V/C/D, abdominal pain, dysuria/frequency, changes in mental status.    VITAL SIGNS:  Blood pressure (!) 113/54, pulse 62, temperature 98.1 F (36.7 C), temperature source Oral, resp. rate 16, height '5\' 5"'$  (1.651 m), weight 86.1 kg (189 lb 12.8 oz), SpO2 98 %.  I/O:    Intake/Output Summary (Last 24 hours) at 07/06/16 1526 Last data filed at 07/06/16 1040  Gross per 24 hour  Intake              480 ml  Output              900 ml  Net             -420 ml    PHYSICAL EXAMINATION:   GENERAL:  83 y.o.-year-old patient lying in the bed with no acute distress.  EYES: Pupils equal, round, reactive to light and accommodation. No scleral icterus. Extraocular muscles intact.  HEENT: Head atraumatic, normocephalic. Oropharynx and nasopharynx clear.  NECK:  Supple, no jugular venous distention. No thyroid enlargement, no tenderness.  LUNGS: Normal breath sounds bilaterally, no wheezing, rales,rhonchi or crepitation. No use of accessory muscles of respiration.  CARDIOVASCULAR: S1, S2 normal. No murmurs, rubs, or gallops.  ABDOMEN: Soft, nontender, nondistended. Bowel sounds present. No organomegaly or mass.  EXTREMITIES: No pedal edema, cyanosis, or clubbing.   NEUROLOGIC: Cranial nerves II through XII are intact. Muscle strength 5/5 in all extremities. Sensation intact. Gait not checked.  PSYCHIATRIC: The patient is alert and oriented x 3.  SKIN: No obvious rash, lesion, or ulcer.   DATA REVIEW:   CBC  Recent Labs Lab 07/04/16 1840  WBC 4.1  HGB 10.4*  HCT 30.0*  PLT 235    Chemistries   Recent Labs Lab 07/04/16 1840  NA 135  K 5.2*  CL 104  CO2 23  GLUCOSE 147*  BUN 45*  CREATININE 1.46*  CALCIUM 9.2    Cardiac Enzymes  Recent Labs Lab 07/05/16 1322  TROPONINI <0.03    Microbiology Results  Results for orders placed or performed during the hospital encounter of 07/04/16  Blood culture (routine x 2)     Status: None (Preliminary result)   Collection Time: 07/04/16  8:40 PM  Result Value Ref Range Status   Specimen Description BLOOD LEFT AC  Final   Special Requests BOTTLES DRAWN AEROBIC AND ANAEROBIC BCAV  Final   Culture NO GROWTH 2 DAYS  Final   Report Status PENDING  Incomplete  Blood culture (routine x 2)     Status: None (Preliminary result)   Collection Time: 07/04/16  8:40 PM  Result Value Ref Range Status   Specimen Description BLOOD RIGHT HAND  Final   Special Requests BOTTLES DRAWN AEROBIC AND ANAEROBIC [place]  Final   Culture NO GROWTH 2 DAYS  Final   Report Status PENDING  Incomplete    RADIOLOGY:  Dg Chest 2 View  Result Date: 07/04/2016 CLINICAL DATA:  83 year old with acute onset of left-sided chest pain began earlier today and has progressively worsened. Personal history of metastatic right upper lobe lung cancer post radiation. EXAM: CHEST  2 VIEW COMPARISON:  PET-CT 10/29/2015, 06/05/2015 and earlier. CT chest 01/01/2015, 03/15/2013  and earlier. Chest x-rays 12/10/2013, 10/29/2012 and earlier. FINDINGS: AP erect and lateral images were obtained. Cardiac silhouette upper normal in size for AP technique, unchanged. Thoracic aorta mildly atherosclerotic, unchanged. Masslike fibrosis involving the  right perihilar region, unchanged over multiple prior chest x-rays and CTs. New streaky opacities involving the left lower lobe. Lungs otherwise clear. Right jugular Port-A-Cath tip in the lower SVC, unchanged. IMPRESSION: 1. Acute left lower lobe atelectasis and/or bronchopneumonia. 2. Stable post radiation fibrosis involving the right perihilar region accounting for the masslike opacity. Electronically Signed   By: Evangeline Dakin M.D.   On: 07/04/2016 19:15    EKG:   Orders placed or performed during the hospital encounter of 07/04/16  . EKG 12-Lead  . EKG 12-Lead  . EKG 12-Lead  . EKG 12-Lead  . EKG 12-Lead (at 6am)  . EKG 12-Lead (Repeat cardiac markers, recurrent chest pain)  . EKG 12-Lead (at 6am)  . EKG 12-Lead (Repeat cardiac markers, recurrent chest pain)      Management plans discussed with the patient, family and they are in agreement.  CODE STATUS:     Code Status Orders        Start     Ordered   07/05/16 0008  Full code  Continuous     07/05/16 0007    Code Status History    Date Active Date Inactive Code Status Order ID Comments User Context   06/27/2015  5:31 PM 06/29/2015  8:52 PM Full Code [id number]  L[place]le But[place], MD ED    Advance Directive Documentation   Flowsheet Row Most Recent Value  Type of Advance Directive  Healthcare Power of Attorney, Living will  Pre-existing out of facility DNR order (yellow form or pink MOST form)  No data  "MOST" Form in Place?  No data      TOTAL TIME TAKING CARE OF THIS PATIENT: 35 minutes.    Vaughan Basta M.D on 07/06/2016 at 3:26 PM  Between 7am to 6pm - Pager - [phone]  After 6pm go to [email or web address] - password [email or web address] [facility]  Office  [phone]  CC: Primary care physician; Vista Mink, FNP   Note: This dictation was prepared with Dragon dictation along with smaller phrase technology. Any transcriptional errors that result from this process are  unintentional.

## 2016-07-06 NOTE — Discharge Instructions (Signed)

## 2016-07-07 LAB — ECHOCARDIOGRAM COMPLETE
HEIGHTINCHES: 65 in
Weight: 3036.8 oz

## 2016-07-09 LAB — CULTURE, BLOOD (ROUTINE X 2)
CULTURE: NO GROWTH
Culture: NO GROWTH

## 2016-08-12 ENCOUNTER — Emergency Department: Payer: Medicare Other

## 2016-08-12 ENCOUNTER — Inpatient Hospital Stay
Admission: EM | Admit: 2016-08-12 | Discharge: 2016-08-16 | DRG: 312 | Disposition: A | Discharge status: Home Health | Payer: Medicare Other | Attending: Internal Medicine | Admitting: Internal Medicine

## 2016-08-12 DIAGNOSIS — R55 Syncope and collapse: Secondary | ICD-10-CM | POA: Diagnosis not present

## 2016-08-12 DIAGNOSIS — I440 Atrioventricular block, first degree: Secondary | ICD-10-CM | POA: Diagnosis present

## 2016-08-12 DIAGNOSIS — I509 Heart failure, unspecified: Secondary | ICD-10-CM | POA: Diagnosis present

## 2016-08-12 DIAGNOSIS — N189 Chronic kidney disease, unspecified: Secondary | ICD-10-CM | POA: Diagnosis present

## 2016-08-12 DIAGNOSIS — Z955 Presence of coronary angioplasty implant and graft: Secondary | ICD-10-CM

## 2016-08-12 DIAGNOSIS — Z8673 Personal history of transient ischemic attack (TIA), and cerebral infarction without residual deficits: Secondary | ICD-10-CM

## 2016-08-12 DIAGNOSIS — G4733 Obstructive sleep apnea (adult) (pediatric): Secondary | ICD-10-CM | POA: Diagnosis present

## 2016-08-12 DIAGNOSIS — K219 Gastro-esophageal reflux disease without esophagitis: Secondary | ICD-10-CM | POA: Diagnosis present

## 2016-08-12 DIAGNOSIS — I252 Old myocardial infarction: Secondary | ICD-10-CM

## 2016-08-12 DIAGNOSIS — E039 Hypothyroidism, unspecified: Secondary | ICD-10-CM | POA: Diagnosis present

## 2016-08-12 DIAGNOSIS — Z7984 Long term (current) use of oral hypoglycemic drugs: Secondary | ICD-10-CM

## 2016-08-12 DIAGNOSIS — N289 Disorder of kidney and ureter, unspecified: Secondary | ICD-10-CM

## 2016-08-12 DIAGNOSIS — M1990 Unspecified osteoarthritis, unspecified site: Secondary | ICD-10-CM | POA: Diagnosis present

## 2016-08-12 DIAGNOSIS — I499 Cardiac arrhythmia, unspecified: Secondary | ICD-10-CM

## 2016-08-12 DIAGNOSIS — I951 Orthostatic hypotension: Secondary | ICD-10-CM | POA: Diagnosis present

## 2016-08-12 DIAGNOSIS — Z7902 Long term (current) use of antithrombotics/antiplatelets: Secondary | ICD-10-CM

## 2016-08-12 DIAGNOSIS — Z85118 Personal history of other malignant neoplasm of bronchus and lung: Secondary | ICD-10-CM

## 2016-08-12 DIAGNOSIS — G894 Chronic pain syndrome: Secondary | ICD-10-CM | POA: Diagnosis present

## 2016-08-12 DIAGNOSIS — I4891 Unspecified atrial fibrillation: Secondary | ICD-10-CM | POA: Diagnosis present

## 2016-08-12 DIAGNOSIS — J449 Chronic obstructive pulmonary disease, unspecified: Secondary | ICD-10-CM | POA: Diagnosis present

## 2016-08-12 DIAGNOSIS — E0590 Thyrotoxicosis, unspecified without thyrotoxic crisis or storm: Secondary | ICD-10-CM | POA: Diagnosis present

## 2016-08-12 DIAGNOSIS — D649 Anemia, unspecified: Secondary | ICD-10-CM | POA: Diagnosis present

## 2016-08-12 DIAGNOSIS — D510 Vitamin B12 deficiency anemia due to intrinsic factor deficiency: Secondary | ICD-10-CM

## 2016-08-12 DIAGNOSIS — I130 Hypertensive heart and chronic kidney disease with heart failure and stage 1 through stage 4 chronic kidney disease, or unspecified chronic kidney disease: Secondary | ICD-10-CM | POA: Diagnosis present

## 2016-08-12 DIAGNOSIS — Z79899 Other long term (current) drug therapy: Secondary | ICD-10-CM

## 2016-08-12 DIAGNOSIS — R300 Dysuria: Secondary | ICD-10-CM | POA: Diagnosis present

## 2016-08-12 DIAGNOSIS — E1140 Type 2 diabetes mellitus with diabetic neuropathy, unspecified: Secondary | ICD-10-CM | POA: Diagnosis present

## 2016-08-12 DIAGNOSIS — E1122 Type 2 diabetes mellitus with diabetic chronic kidney disease: Secondary | ICD-10-CM | POA: Diagnosis present

## 2016-08-12 DIAGNOSIS — E875 Hyperkalemia: Secondary | ICD-10-CM

## 2016-08-12 DIAGNOSIS — E785 Hyperlipidemia, unspecified: Secondary | ICD-10-CM | POA: Diagnosis present

## 2016-08-12 DIAGNOSIS — E871 Hypo-osmolality and hyponatremia: Secondary | ICD-10-CM

## 2016-08-12 DIAGNOSIS — I639 Cerebral infarction, unspecified: Secondary | ICD-10-CM

## 2016-08-12 DIAGNOSIS — Z7982 Long term (current) use of aspirin: Secondary | ICD-10-CM

## 2016-08-12 DIAGNOSIS — I2510 Atherosclerotic heart disease of native coronary artery without angina pectoris: Secondary | ICD-10-CM | POA: Diagnosis present

## 2016-08-12 DIAGNOSIS — Z96642 Presence of left artificial hip joint: Secondary | ICD-10-CM | POA: Diagnosis present

## 2016-08-12 DIAGNOSIS — F419 Anxiety disorder, unspecified: Secondary | ICD-10-CM | POA: Diagnosis present

## 2016-08-12 LAB — BASIC METABOLIC PANEL
ANION GAP: 6 (ref 5–15)
BUN: 26 mg/dL — ABNORMAL HIGH (ref 6–20)
CHLORIDE: 103 mmol/L (ref 101–111)
CO2: 25 mmol/L (ref 22–32)
CREATININE: 1.59 mg/dL — AB (ref 0.44–1.00)
Calcium: 9 mg/dL (ref 8.9–10.3)
GFR calc non Af Amer: 28 mL/min — ABNORMAL LOW (ref >60)
GFR, EST AFRICAN AMERICAN: 32 mL/min — AB (ref >60)
Glucose, Bld: 131 mg/dL — ABNORMAL HIGH (ref 65–99)
POTASSIUM: 5.8 mmol/L — AB (ref 3.5–5.1)
SODIUM: 134 mmol/L — AB (ref 135–145)

## 2016-08-12 LAB — TROPONIN I
Troponin I: <0.03 ng/mL (ref <0.03)
Troponin I: <0.03 ng/mL (ref <0.03)

## 2016-08-12 LAB — CBC
HEMATOCRIT: 29.5 % — AB (ref 35.0–47.0)
Hemoglobin: 9.9 g/dL — ABNORMAL LOW (ref 12.0–16.0)
MCH: 30 pg (ref 26.0–34.0)
MCHC: 33.7 g/dL (ref 32.0–36.0)
MCV: 89.2 fL (ref 80.0–100.0)
PLATELETS: 230 10*3/uL (ref 150–440)
RBC: 3.3 MIL/uL — AB (ref 3.80–5.20)
RDW: 13 % (ref 11.5–14.5)
WBC: 4.2 10*3/uL (ref 3.6–11.0)

## 2016-08-12 MED ORDER — SODIUM POLYSTYRENE SULFONATE 15 GM/60ML PO SUSP
30.0000 g | Freq: Once | ORAL | Status: AC
  Administered 2016-08-12: 30 g via ORAL
  Filled 2016-08-12: qty 120

## 2016-08-12 MED ORDER — ASPIRIN EC 81 MG PO TBEC
81.0000 mg | DELAYED_RELEASE_TABLET | Freq: Every day | ORAL | Status: DC
  Administered 2016-08-13 – 2016-08-16 (×4): 81 mg via ORAL
  Filled 2016-08-12 (×4): qty 1

## 2016-08-12 MED ORDER — TRAMADOL-ACETAMINOPHEN 37.5-325 MG PO TABS
1.0000 | ORAL_TABLET | Freq: Four times a day (QID) | ORAL | Status: DC | PRN
Start: 2016-08-12 — End: 2016-08-16
  Administered 2016-08-12 – 2016-08-16 (×5): 1 via ORAL
  Filled 2016-08-12 (×5): qty 1

## 2016-08-12 MED ORDER — GLIPIZIDE ER 10 MG PO TB24
10.0000 mg | ORAL_TABLET | Freq: Every day | ORAL | Status: DC
  Administered 2016-08-13 – 2016-08-16 (×4): 10 mg via ORAL
  Filled 2016-08-12 (×4): qty 1

## 2016-08-12 MED ORDER — CHLORHEXIDINE GLUCONATE 0.12 % MT SOLN
15.0000 mL | Freq: Two times a day (BID) | OROMUCOSAL | Status: DC
  Administered 2016-08-13 – 2016-08-16 (×5): 15 mL via OROMUCOSAL
  Filled 2016-08-12 (×3): qty 15

## 2016-08-12 MED ORDER — ORAL CARE MOUTH RINSE
15.0000 mL | Freq: Two times a day (BID) | OROMUCOSAL | Status: DC
  Administered 2016-08-13 – 2016-08-15 (×5): 15 mL via OROMUCOSAL

## 2016-08-12 MED ORDER — ACETAMINOPHEN 325 MG PO TABS: 650.0000 mg | ORAL_TABLET | Freq: Four times a day (QID) | ORAL | Status: DC | PRN

## 2016-08-12 MED ORDER — LEVOTHYROXINE SODIUM 25 MCG PO TABS
125.0000 ug | ORAL_TABLET | Freq: Every day | ORAL | Status: DC
  Administered 2016-08-13 – 2016-08-16 (×4): 125 ug via ORAL
  Filled 2016-08-12 (×4): qty 1

## 2016-08-12 MED ORDER — BENAZEPRIL HCL 20 MG PO TABS
20.0000 mg | ORAL_TABLET | Freq: Every day | ORAL | Status: DC
  Administered 2016-08-12 – 2016-08-14 (×3): 20 mg via ORAL
  Filled 2016-08-12 (×3): qty 1

## 2016-08-12 MED ORDER — ACETAMINOPHEN 650 MG RE SUPP: 650.0000 mg | Freq: Four times a day (QID) | RECTAL | Status: DC | PRN

## 2016-08-12 MED ORDER — AMLODIPINE BESY-BENAZEPRIL HCL 10-20 MG PO CAPS: 1.0000 | ORAL_CAPSULE | Freq: Every evening | ORAL | Status: DC

## 2016-08-12 MED ORDER — TRAMADOL-ACETAMINOPHEN 37.5-325 MG PO TABS: 1.0000 | ORAL_TABLET | Freq: Four times a day (QID) | ORAL | Status: DC | PRN

## 2016-08-12 MED ORDER — ISOSORBIDE MONONITRATE ER 60 MG PO TB24
60.0000 mg | ORAL_TABLET | Freq: Two times a day (BID) | ORAL | Status: DC
  Administered 2016-08-12 – 2016-08-16 (×8): 60 mg via ORAL
  Filled 2016-08-12 (×8): qty 1

## 2016-08-12 MED ORDER — POLYSACCHARIDE IRON COMPLEX 150 MG PO CAPS
150.0000 mg | ORAL_CAPSULE | Freq: Every day | ORAL | Status: DC
  Administered 2016-08-12 – 2016-08-16 (×5): 150 mg via ORAL
  Filled 2016-08-12 (×5): qty 1

## 2016-08-12 MED ORDER — MECLIZINE HCL 25 MG PO TABS
25.0000 mg | ORAL_TABLET | Freq: Two times a day (BID) | ORAL | Status: DC
  Administered 2016-08-12 – 2016-08-16 (×8): 25 mg via ORAL
  Filled 2016-08-12 (×8): qty 1

## 2016-08-12 MED ORDER — ROSUVASTATIN CALCIUM 20 MG PO TABS
20.0000 mg | ORAL_TABLET | Freq: Every day | ORAL | Status: DC
  Administered 2016-08-12 – 2016-08-16 (×5): 20 mg via ORAL
  Filled 2016-08-12 (×5): qty 1

## 2016-08-12 MED ORDER — GABAPENTIN 100 MG PO CAPS
100.0000 mg | ORAL_CAPSULE | Freq: Two times a day (BID) | ORAL | Status: DC
  Administered 2016-08-12 – 2016-08-16 (×8): 100 mg via ORAL
  Filled 2016-08-12 (×8): qty 1

## 2016-08-12 MED ORDER — RANOLAZINE ER 500 MG PO TB12
500.0000 mg | ORAL_TABLET | Freq: Two times a day (BID) | ORAL | Status: DC
  Administered 2016-08-12 – 2016-08-13 (×3): 500 mg via ORAL
  Filled 2016-08-12 (×4): qty 1

## 2016-08-12 MED ORDER — SODIUM CHLORIDE 0.9 % IV SOLN
Freq: Once | INTRAVENOUS | Status: AC
  Administered 2016-08-12: 17:00:00 via INTRAVENOUS

## 2016-08-12 MED ORDER — AMLODIPINE BESYLATE 10 MG PO TABS
10.0000 mg | ORAL_TABLET | Freq: Every day | ORAL | Status: DC
  Administered 2016-08-12 – 2016-08-16 (×5): 10 mg via ORAL
  Filled 2016-08-12 (×5): qty 1

## 2016-08-12 MED ORDER — MELOXICAM 7.5 MG PO TABS
7.5000 mg | ORAL_TABLET | ORAL | Status: DC
  Administered 2016-08-13 – 2016-08-15 (×2): 7.5 mg via ORAL
  Filled 2016-08-12 (×2): qty 1

## 2016-08-12 MED ORDER — ONDANSETRON HCL 4 MG/2ML IJ SOLN: 4.0000 mg | Freq: Four times a day (QID) | INTRAMUSCULAR | Status: DC | PRN

## 2016-08-12 MED ORDER — SODIUM CHLORIDE 0.9% FLUSH
3.0000 mL | Freq: Two times a day (BID) | INTRAVENOUS | Status: DC
  Administered 2016-08-12 – 2016-08-16 (×8): 3 mL via INTRAVENOUS

## 2016-08-12 MED ORDER — FENOFIBRATE 145 MG PO TABS
145.0000 mg | ORAL_TABLET | Freq: Every day | ORAL | Status: DC
  Administered 2016-08-12 – 2016-08-16 (×5): 145 mg via ORAL
  Filled 2016-08-12 (×5): qty 1

## 2016-08-12 MED ORDER — TIOTROPIUM BROMIDE MONOHYDRATE 18 MCG IN CAPS
18.0000 ug | ORAL_CAPSULE | Freq: Every day | RESPIRATORY_TRACT | Status: DC
  Administered 2016-08-13 – 2016-08-16 (×4): 18 ug via RESPIRATORY_TRACT
  Filled 2016-08-12: qty 5

## 2016-08-12 MED ORDER — MONTELUKAST SODIUM 10 MG PO TABS
10.0000 mg | ORAL_TABLET | Freq: Every day | ORAL | Status: DC
Start: 2016-08-12 — End: 2016-08-16
  Administered 2016-08-12 – 2016-08-16 (×5): 10 mg via ORAL
  Filled 2016-08-12 (×5): qty 1

## 2016-08-12 MED ORDER — PANTOPRAZOLE SODIUM 40 MG PO TBEC
40.0000 mg | DELAYED_RELEASE_TABLET | Freq: Every day | ORAL | Status: DC
  Administered 2016-08-13 – 2016-08-16 (×4): 40 mg via ORAL
  Filled 2016-08-12 (×4): qty 1

## 2016-08-12 MED ORDER — GI COCKTAIL ~~LOC~~
30.0000 mL | Freq: Three times a day (TID) | ORAL | Status: DC | PRN
  Filled 2016-08-12: qty 30

## 2016-08-12 MED ORDER — CLOPIDOGREL BISULFATE 75 MG PO TABS
75.0000 mg | ORAL_TABLET | Freq: Every day | ORAL | Status: DC
  Administered 2016-08-13 – 2016-08-16 (×4): 75 mg via ORAL
  Filled 2016-08-12 (×4): qty 1

## 2016-08-12 MED ORDER — NITROGLYCERIN 0.4 MG SL SUBL: 0.4000 mg | SUBLINGUAL_TABLET | SUBLINGUAL | Status: DC | PRN

## 2016-08-12 MED ORDER — ENOXAPARIN SODIUM 30 MG/0.3ML ~~LOC~~ SOLN
30.0000 mg | SUBCUTANEOUS | Status: DC
Start: 2016-08-12 — End: 2016-08-13
  Administered 2016-08-12: 30 mg via SUBCUTANEOUS
  Filled 2016-08-12: qty 0.3

## 2016-08-12 MED ORDER — ALPRAZOLAM 0.25 MG PO TABS
0.2500 mg | ORAL_TABLET | Freq: Two times a day (BID) | ORAL | Status: DC | PRN
Start: 2016-08-12 — End: 2016-08-16
  Administered 2016-08-14 – 2016-08-15 (×2): 0.25 mg via ORAL
  Filled 2016-08-12 (×2): qty 1

## 2016-08-12 MED ORDER — ONDANSETRON HCL 4 MG PO TABS: 4.0000 mg | ORAL_TABLET | Freq: Four times a day (QID) | ORAL | Status: DC | PRN

## 2016-08-12 NOTE — ED Triage Notes (Signed)
Pt sent from MD [facility] office w/ abnormal EKG.  Pt sts that she had near syncopal episode last night, has felt weak and "like I'm going to crumple up" since. Pt alert and oriented, resp even and unlabored.  Pt sts that she has been dealing w/ unusual HR for "some time"

## 2016-08-12 NOTE — ED Provider Notes (Addendum)
[facility] Emergency Department Provider Note        Time seen: ----------------------------------------- 3:25 PM on 08/12/2016 -----------------------------------------    I have reviewed the triage vital signs and the nursing notes.   HISTORY  Chief Complaint Weakness and Chest Pain    HPI [name] is a 83 y.o. female who presents to the ER brought from the cardiologist's office with an abnormal EKG. Patient reports she had a near syncopal event last night and has felt weak. Patient states she's been doing with an unusual heart rate for some time, specifically over the last year she's had many episodes. She was seen in the cardiologist's office and found to be in a junctional rhythm and was still feeling weak. She denies recent illness.   Past Medical History:  Diagnosis Date  . Arrhythmia   . Arthritis   . Asthma   . Atrial fibrillation ([place])   . Cancer ([place])    lung cancer  . COPD (chronic obstructive pulmonary disease) ([place])   . Coronary artery disease   . Depression   . Diabetes mellitus without complication ([place])   . Heart attack   . Heart disease   . Heart failure ([place])   . Heartburn   . HLD (hyperlipidemia)   . Hypertension   . Hyperthyroidism   . Kidney failure   . Sleep apnea   . Stroke [facility])     Patient Active Problem List   Diagnosis Date Noted  . Chest pain, rule out acute myocardial infarction 07/04/2016  . Recurrent UTI 12/29/2015  . Dysuria 12/29/2015  . Acute kidney injury ([place]) 06/27/2015  . Hypoglycemia 06/27/2015  . OBSTRUCTIVE SLEEP APNEA 03/15/2007  . CORONARY ARTERY DISEASE 03/15/2007  . ASTHMA, CHRONIC OBSTRUCTIVE NOS 03/15/2007  . FIBROMYALGIA 03/15/2007    Past Surgical History:  Procedure Laterality Date  . CHOLECYSTECTOMY  1983  . EYE SURGERY  2012/2016  . THYROID SURGERY  1963  . TOTAL HIP ARTHROPLASTY Left 2008    Allergies Bactrim [sulfamethoxazole-trimethoprim]; Darvon [propoxyphene];  Albuterol; Fentanyl; Latex; and Tetracyclines & related  Social History Social History  Substance Use Topics  . Smoking status: Never Smoker  . Smokeless tobacco: Never Used  . Alcohol use No   Review of Systems Constitutional: Negative for fever. Cardiovascular: Negative for chest pain. Respiratory: Negative for shortness of breath. Gastrointestinal: Negative for abdominal pain, vomiting and diarrhea. Skin: Negative for rash. Neurological: Negative for headaches,Positive for generalized weakness  10-point ROS otherwise negative.  ____________________________________________   PHYSICAL EXAM:  VITAL SIGNS: ED Triage Vitals  Enc Vitals Group     BP 08/12/16 1448 (!) 139/57     Pulse Rate 08/12/16 1448 (!) 59     Resp 08/12/16 1448 20     Temp 08/12/16 1448 97.9 F (36.6 C)     Temp Source 08/12/16 1448 Oral     SpO2 08/12/16 1448 100 %     Weight 08/12/16 1449 190 lb (86.2 kg)     Height 08/12/16 1449 '5\' 5"'$  (1.651 m)     Head Circumference --      Peak Flow --      Pain Score 08/12/16 1449 7     Pain Loc --      Pain Edu? --      Excl. in [place]? --     Constitutional: Alert and oriented. Well appearing and in no distress. Eyes: Conjunctivae are normal. PERRL. Normal extraocular movements. ENT   Head: Normocephalic and atraumatic.  Nose: No congestion/rhinnorhea.   Mouth/Throat: Mucous membranes are moist.   Neck: No stridor. Cardiovascular: Normal rate, regular rhythm. No murmurs, rubs, or gallops. Respiratory: Normal respiratory effort without tachypnea nor retractions. Breath sounds are clear and equal bilaterally. No wheezes/rales/rhonchi. Gastrointestinal: Soft and nontender. Normal bowel sounds Musculoskeletal: Nontender with normal range of motion in all extremities. No lower extremity tenderness nor edema. Neurologic:  Normal speech and language. No gross focal neurologic deficits are appreciated.  Skin:  Skin is warm, dry and intact. No rash  noted. Psychiatric: Mood and affect are normal. Speech and behavior are normal.  ____________________________________________  EKG: Interpreted by me.Junctional rhythm with a rate of 60 bpm, normal QRS size, normal QT, low voltage.  Repeat EKG interpreted by me with a sinus rhythm, prolonged PR interval. Rate is within normal limits, low voltage. ____________________________________________  ED COURSE:  Pertinent labs & imaging results that were available during my care of the patient were reviewed by me and considered in my medical decision making (see chart for details). Patient presents to ER for near syncope and arrhythmia. We will assess with labs and imaging.   Procedures ____________________________________________   LABS (pertinent positives/negatives)  Labs Reviewed  BASIC METABOLIC PANEL - Abnormal; Notable for the following:       Result Value   Sodium 134 (*)    Potassium 5.8 (*)    Glucose, Bld 131 (*)    BUN 26 (*)    Creatinine, Ser 1.59 (*)    GFR calc non Af Amer 28 (*)    GFR calc Af Amer 32 (*)    All other components within normal limits  CBC - Abnormal; Notable for the following:    RBC 3.30 (*)    Hemoglobin 9.9 (*)    HCT 29.5 (*)    All other components within normal limits  TROPONIN I    RADIOLOGY Chest x-ray IMPRESSION: 1. Interval linear atelectasis or scarring in the right mid lung zone. 2. Progressive cardiomegaly. 3. Stable right hilar mass or fibrosis. 4. Aortic atherosclerosis. ____________________________________________  FINAL ASSESSMENT AND PLAN  Near syncope, arrhythmia  Plan: Patient with labs and imaging as dictated above. Patient presents to the ER no distress. Plan for cardiology was observation in the hospital. Dr. Chancy Milroy reports that the patient can have a temporary pacemaker placed as needed but will likely not need this during her hospital stay. I will discuss with the hospitalist for admission.   Earleen Newport,  MD   Note: This note was generated in part or whole with voice recognition software. Voice recognition is usually quite accurate but there are transcription errors that can and very often do occur. I apologize for any typographical errors that were not detected and corrected.     Earleen Newport, MD 08/12/16 [place], MD 08/12/16 [phone]

## 2016-08-12 NOTE — H&P (Signed)
[place] [facility] [place] NAME: Providencia Hottenstein    MR#:  [id number]  DATE OF BIRTH:  1926/05/06  DATE OF ADMISSION:  08/12/2016  PRIMARY CARE PHYSICIAN: Lorelee Market, MD   REQUESTING/REFERRING PHYSICIAN: Dr. Lenise Arena  CHIEF COMPLAINT:   Chief Complaint  Patient presents with  . Weakness  . Chest Pain    HISTORY OF PRESENT ILLNESS:  [name]  is a 83 y.o. female with a known history of Hypertension, previous history of coronary artery disease, COPD, history of lung cancer, diabetes,  hyperlipidemia, obstructive sleep apnea, chronic pain syndrome who presents to the hospital due to a presyncopal event. Patient was sent to the ER at from her cardiologist's office.  She says that she had a feeling this morning when she was ambulating when she thought she was given a pass out but she didn't. Her legs became weak, she became nauseous and somewhat diaphoretic. She went to see her cardiologist today and she was noted to be in a junctional rhythm and then sent to the ER for further evaluation. Patient says that she worked with her physical therapist at home and has been having episodes of high heart rates and sometimes being low and her home health nurse/physical therapist recommended she go see her physician. Patient does complain of some vague chest pain tightness prior to her presyncopal event but denies any nausea vomiting headache or any other associated symptoms presently. Hospitalist services were contacted further treatment and evaluation.  PAST MEDICAL HISTORY:   Past Medical History:  Diagnosis Date  . Arrhythmia   . Arthritis   . Asthma   . Atrial fibrillation ([place])   . Cancer ([place])    lung cancer  . COPD (chronic obstructive pulmonary disease) ([place])   . Coronary artery disease   . Depression   . Diabetes mellitus without complication ([place])   . Heart attack   . Heart disease   . Heart failure ([place])   . Heartburn   . HLD  (hyperlipidemia)   . Hypertension   . Hyperthyroidism   . Kidney failure   . Sleep apnea   . Stroke [facility])     PAST SURGICAL HISTORY:   Past Surgical History:  Procedure Laterality Date  . CHOLECYSTECTOMY  1983  . EYE SURGERY  2012/2016  . THYROID SURGERY  1963  . TOTAL HIP ARTHROPLASTY Left 2008    SOCIAL HISTORY:   Social History  Substance Use Topics  . Smoking status: Never Smoker  . Smokeless tobacco: Never Used  . Alcohol use No    FAMILY HISTORY:   Family History  Problem Relation Age of Onset  . Diabetes Other   . Hypertension Other   . Hematuria Father   . Kidney failure Father   . Kidney cancer Brother   . Kidney failure Mother   . Kidney Stones Brother     DRUG ALLERGIES:   Allergies  Allergen Reactions  . Bactrim [Sulfamethoxazole-Trimethoprim]     "Diabetic seizures"  . Darvon [Propoxyphene] Other (See Comments)    Patient states she was real weak, felt like she was going to pass out.  . Albuterol Palpitations and Other (See Comments)    Irregular heart beat, CAN TOLERATE XOPENEX  . Fentanyl Nausea And Vomiting and Rash  . Latex Rash  . Tetracyclines & Related Rash    REVIEW OF SYSTEMS:   Review of Systems  Constitutional: Negative for fever and weight loss.  HENT: Negative for  congestion, nosebleeds and tinnitus.   Eyes: Negative for blurred vision, double vision and redness.  Respiratory: Negative for cough, hemoptysis and shortness of breath.   Cardiovascular: Positive for chest pain. Negative for orthopnea, leg swelling and PND.       + Pre-syncope.   Gastrointestinal: Negative for abdominal pain, diarrhea, melena, nausea and vomiting.  Genitourinary: Negative for dysuria, hematuria and urgency.  Musculoskeletal: Negative for falls and joint pain.  Neurological: Negative for dizziness, tingling, sensory change, focal weakness, seizures, weakness and headaches.  Endo/Heme/Allergies: Negative for polydipsia. Does not bruise/bleed  easily.  Psychiatric/Behavioral: Negative for depression and memory loss. The patient is not nervous/anxious.     MEDICATIONS AT HOME:   Prior to Admission medications   Medication Sig Start Date End Date Taking? Authorizing Provider  ALPRAZolam (XANAX) 0.25 MG tablet Take 1 tablet (0.25 mg total) by mouth 2 (two) times daily as needed for anxiety. 07/06/16  Yes Vaughan Basta, MD  amLODipine-benazepril (LOTREL) 10-20 MG capsule Take 1 capsule by mouth every evening. 08/20/13  Yes Historical Provider, MD  aspirin EC 81 MG EC tablet Take 1 tablet (81 mg total) by mouth daily. 07/07/16  Yes Vaughan Basta, MD  Choline Fenofibrate (FENOFIBRIC ACID) 135 MG CPDR Take 135 mg by mouth daily. Reported on 11/24/2015 06/22/15  Yes Historical Provider, MD  clopidogrel (PLAVIX) 75 MG tablet Take 1 tablet (75 mg total) by mouth daily. 07/07/16  Yes Vaughan Basta, MD  estradiol (ESTRACE VAGINAL) 0.1 MG/GM vaginal cream Apply 0.'5mg'$  (pea-sized amount)  just inside the vaginal introitus with a finger-tip every night for two weeks and then Monday, Wednesday and Friday nights. 10/27/15  Yes Shannon A McGowan, PA-C  gabapentin (NEURONTIN) 100 MG capsule Take 100 mg by mouth 2 (two) times daily. 05/26/15  Yes Historical Provider, MD  glipiZIDE (GLUCOTROL XL) 10 MG 24 hr tablet Take 10 mg by mouth daily with breakfast.   Yes Historical Provider, MD  iron polysaccharides (NIFEREX) 150 MG capsule Take 1 capsule (150 mg total) by mouth daily. 07/07/16  Yes Vaughan Basta, MD  isosorbide mononitrate (IMDUR) 60 MG 24 hr tablet Take 60 mg by mouth 2 (two) times daily. 05/26/15  Yes Historical Provider, MD  levalbuterol (XOPENEX HFA) 45 MCG/ACT inhaler Inhale 1 puff into the lungs 4 (four) times daily as needed. For wheezing/shortness of breath.   Yes Historical Provider, MD  levalbuterol (XOPENEX) 1.25 MG/3ML nebulizer solution Take 1.25 mg by nebulization every 4 (four) hours as needed for wheezing or  shortness of breath.   Yes Historical Provider, MD  levothyroxine (SYNTHROID, LEVOTHROID) 125 MCG tablet Take 125 mcg by mouth daily before breakfast. 06/09/15  Yes Historical Provider, MD  meclizine (ANTIVERT) 25 MG tablet Take 25 mg by mouth 2 (two) times daily.   Yes Historical Provider, MD  meloxicam (MOBIC) 7.5 MG tablet Take 7.5 mg by mouth every other day. 05/18/15  Yes Historical Provider, MD  montelukast (SINGULAIR) 10 MG tablet Take 10 mg by mouth daily. 06/17/15  Yes Historical Provider, MD  nitroGLYCERIN (NITROSTAT) 0.4 MG SL tablet Place 1 tablet (0.4 mg total) under the tongue every 5 (five) minutes as needed for chest pain. 07/06/16  Yes Vaughan Basta, MD  pantoprazole (PROTONIX) 40 MG tablet Take 1 tablet (40 mg total) by mouth daily. 07/07/16  Yes Vaughan Basta, MD  ranolazine (RANEXA) 500 MG 12 hr tablet Take 1 tablet (500 mg total) by mouth 2 (two) times daily. 07/06/16  Yes Vaughan Basta, MD  rosuvastatin ([place])  20 MG tablet Take 20 mg by mouth daily.  05/18/15  Yes Historical Provider, MD  SPIRIVA HANDIHALER 18 MCG inhalation capsule Place 18 mcg into inhaler and inhale daily. 05/26/15  Yes Historical Provider, MD  torsemide (DEMADEX) 20 MG tablet Take 10-20 mg by mouth daily as needed. For edema/fluid. 05/18/15  Yes Historical Provider, MD  traMADol-acetaminophen (ULTRACET) 37.5-325 MG tablet Take 1 tablet by mouth every 6 (six) hours as needed.   Yes Historical Provider, MD  Alum & Mag Hydroxide-Simeth (GI COCKTAIL) SUSP suspension Take 30 mLs by mouth 3 (three) times daily as needed for indigestion (or chest pain). Shake well. 07/06/16   Vaughan Basta, MD  HYDROmorphone (DILAUDID) 2 MG tablet Take 0.5 tablets (1 mg total) by mouth every 6 (six) hours as needed for severe pain. Patient not taking: Reported on 08/12/2016 07/06/16   Vaughan Basta, MD      VITAL SIGNS:  Blood pressure (!) 155/78, pulse 87, temperature 97.9 F (36.6 C), temperature  source Oral, resp. rate (!) 23, height '5\' 5"'$  (1.651 m), weight 86.2 kg (190 lb), SpO2 100 %.  PHYSICAL EXAMINATION:  Physical Exam  GENERAL:  83 y.o.-year-old patient lying in bed in no acute distress.  EYES: Pupils equal, round, reactive to light and accommodation. No scleral icterus. Extraocular muscles intact.  HEENT: Head atraumatic, normocephalic. Oropharynx and nasopharynx clear. No oropharyngeal erythema, moist oral mucosa  NECK:  Supple, no jugular venous distention. No thyroid enlargement, no tenderness.  LUNGS: Normal breath sounds bilaterally, no wheezing, rales, rhonchi. No use of accessory muscles of respiration.  CARDIOVASCULAR: S1, S2 RRR. II/VI SEM at LSB, No rubs, gallops, clicks.  ABDOMEN: Soft, nontender, nondistended. Bowel sounds present. No organomegaly or mass.  EXTREMITIES: No pedal edema, cyanosis, or clubbing. + 2 pedal & radial pulses b/l.   NEUROLOGIC: Cranial nerves II through XII are intact. No focal Motor or sensory deficits appreciated b/l PSYCHIATRIC: The patient is alert and oriented x 3.  SKIN: No obvious rash, lesion, or ulcer.   LABORATORY PANEL:   CBC  Recent Labs Lab 08/12/16 1530  WBC 4.2  HGB 9.9*  HCT 29.5*  PLT 230   ------------------------------------------------------------------------------------------------------------------  Chemistries   Recent Labs Lab 08/12/16 1530  NA 134*  K 5.8*  CL 103  CO2 25  GLUCOSE 131*  BUN 26*  CREATININE 1.59*  CALCIUM 9.0   ------------------------------------------------------------------------------------------------------------------  Cardiac Enzymes  Recent Labs Lab 08/12/16 1530  TROPONINI <0.03   ------------------------------------------------------------------------------------------------------------------  RADIOLOGY:  Dg Chest 2 View  Result Date: 08/12/2016 CLINICAL DATA:  Abnormal EKG. Heaviness in the chest. Near syncopal episode last night. Right lung cancer. EXAM:  CHEST  2 VIEW COMPARISON:  07/04/2016. FINDINGS: Enlarged cardiac silhouette with a mild increase in size. No gross change in the previously demonstrated right hilar mass or perihilar fibrosis. Stable right jugular porta catheter. Coronary artery stent. Stable minimally prominent interstitial markings. Aortic arch calcifications. Interval linear density in the right mid lung zone. Diffuse osteopenia. Thoracic spine degenerative changes. IMPRESSION: 1. Interval linear atelectasis or scarring in the right mid lung zone. 2. Progressive cardiomegaly. 3. Stable right hilar mass or fibrosis. 4. Aortic atherosclerosis. Electronically Signed   By: Claudie Revering M.D.   On: 08/12/2016 15:28     IMPRESSION AND PLAN:   83 year old female with past medical history of hypertension, hyperlipidemia, history of coronary artery disease, COPD, obstructive sleep apnea, atrial fibrillation, diabetes, previous history of CVA who presents to the hospital due to a presyncopal episode and  noted to have a junctional rhythm.  1. Presyncope-suspected to be cardiogenic in nature given her junctional rhythm noted on EKG. -Patient apparently has had tachybradycardia episodes over the past few days. -We'll observe on telemetry, cycle her cardiac markers, we'll get a cardiology consult (Dr. Humphrey Rolls).  - check 2-D Echo.    2. Hyperkalemia - unclear if this could have led to patient's bradycardia/junctional rhythm. -I will give her a dose of Kayexalate, repeat potassium level in the morning.  3. Essential hypertension-continue Norvasc, benazepril, Imdur  4. Hypothyroidism-continue Synthroid.  5. History of coronary artery disease-patient had some atypical chest pain prior to her presyncopal event. -We'll observe limited, cycle her cardiac markers. Continue aspirin, Plavix, Imdur, Ranexa, statin.  6. Hyperlipidemia-continue Crestor.  7. GERD-continue Protonix.  8. Diabetes type 2 without complication-continue glipizide.  9.  Diabetic neuropathy-continue gabapentin.  10. Anxiety - cont. Xanax.   All the records are reviewed and case discussed with ED provider. Management plans discussed with the patient, family and they are in agreement.  CODE STATUS: Full code  TOTAL TIME TAKING CARE OF THIS PATIENT: 45 minutes.    Henreitta Leber M.D on 08/12/2016 at 5:41 PM  Between 7am to 6pm - Pager - [phone]  After 6pm go to [email or web address] - password [email or web address] [facility]  Office  [phone]  CC: Primary care physician; Lorelee Market, MD

## 2016-08-12 NOTE — Progress Notes (Signed)
Anticoagulation monitoring(Lovenox):  83 yo female ordered Lovenox 40 mg Q24h  Filed Weights   08/12/16 1449 08/12/16 1846  Weight: 190 lb (86.2 kg) 194 lb 1.6 oz (88 kg)   BMI    Lab Results  Component Value Date   CREATININE 1.59 (H) 08/12/2016   CREATININE 1.46 (H) 07/04/2016   CREATININE 1.22 (H) 10/27/2015   Estimated Creatinine Clearance: 26.8 mL/min (A) (by C-G formula based on SCr of 1.59 mg/dL (H)). Hemoglobin & Hematocrit     Component Value Date/Time   HGB 9.9 (L) 08/12/2016 1530   HGB 9.6 (L) 12/11/2013 0507   HGB 10.4 (L) 08/16/2010 0917   HCT 29.5 (L) 08/12/2016 1530   HCT 29.1 (L) 12/11/2013 0507   HCT 29.9 (L) 08/16/2010 1740     Per Protocol for Patient with estCrcl < 30 ml/min and BMI < 40, will transition to Lovenox 30 mg Q24h.

## 2016-08-13 ENCOUNTER — Observation Stay: Payer: Medicare Other

## 2016-08-13 LAB — BASIC METABOLIC PANEL
ANION GAP: 5 (ref 5–15)
BUN: 20 mg/dL (ref 6–20)
CO2: 28 mmol/L (ref 22–32)
Calcium: 8.8 mg/dL — ABNORMAL LOW (ref 8.9–10.3)
Chloride: 107 mmol/L (ref 101–111)
Creatinine, Ser: 1.18 mg/dL — ABNORMAL HIGH (ref 0.44–1.00)
GFR calc non Af Amer: 40 mL/min — ABNORMAL LOW (ref >60)
GFR, EST AFRICAN AMERICAN: 46 mL/min — AB (ref >60)
Glucose, Bld: 97 mg/dL (ref 65–99)
POTASSIUM: 5.1 mmol/L (ref 3.5–5.1)
SODIUM: 140 mmol/L (ref 135–145)

## 2016-08-13 LAB — TROPONIN I

## 2016-08-13 MED ORDER — SODIUM POLYSTYRENE SULFONATE 15 GM/60ML PO SUSP
30.0000 g | Freq: Once | ORAL | Status: AC
  Administered 2016-08-13: 30 g via ORAL
  Filled 2016-08-13: qty 120

## 2016-08-13 MED ORDER — ENOXAPARIN SODIUM 40 MG/0.4ML ~~LOC~~ SOLN
40.0000 mg | SUBCUTANEOUS | Status: DC
  Administered 2016-08-13 – 2016-08-15 (×3): 40 mg via SUBCUTANEOUS
  Filled 2016-08-13 (×3): qty 0.4

## 2016-08-13 NOTE — Evaluation (Signed)
Physical Therapy Evaluation Patient Details Name: [name] MRN: [id number] DOB: 1925/12/28 Today's Date: 08/13/2016   History of Present Illness  83 yo female with onset of presyncope and previous lung scarring from R lung CA was admitted for cardiac arrhythmia, potentially needs a pacemaker.  Has elevated K+ which was corrected.  PMHx:  CAD, stroke, HTN, CHF, a-fib, DM  Clinical Impression  Pt was seen for evaluation of her mobility with focus on vital signs and her symptoms.  While she got light headed at 91% O2 sat, the pulse was 92 and not entirely clear what is causing her symptoms.  Recommending SNF to monitor her strength and safety losses, to increase standing balance control and her gait with AD.  Will have 2 person assist as she is quite unable to know she is ready to sit until last minute, and will need to be more reliable for home.  Follow acutely for these issues, to increase gait distance and safety, to work with doctor for monitoring vitals and to progress as able to shorten SNF stay.    Follow Up Recommendations SNF    Equipment Recommendations  None recommended by PT    Recommendations for Other Services OT consult     Precautions / Restrictions Precautions Precautions: Fall (telemetry) Restrictions Weight Bearing Restrictions: No      Mobility  Bed Mobility Overal bed mobility: Needs Assistance Bed Mobility: Supine to Sit;Sit to Supine     Supine to sit: Min assist Sit to supine: Min guard;Min assist   General bed mobility comments: using bed rails and elevated HOB to get out  Transfers Overall transfer level: Needs assistance Equipment used: Rolling walker (2 wheeled);1 person hand held assist Transfers: Sit to/from [facility] Sit to Stand: Min assist Stand pivot transfers: Mod assist (which did occur after her O2 dropped)       General transfer comment: aware of good hand placement  Ambulation/Gait Ambulation/Gait assistance:  Min assist Ambulation Distance (Feet): 30 Feet Assistive device: Rolling walker (2 wheeled);1 person hand held assist Gait Pattern/deviations: Step-through pattern;Trunk flexed;Wide base of support;Decreased stride length Gait velocity: reduced Gait velocity interpretation: Below normal speed for age/gender General Gait Details: Pt walked on RW and turned then announced she felt light headed (O2 sats dropped to 91% and sat down to rest, no more walking)  Stairs            Wheelchair Mobility    Modified Rankin (Stroke Patients Only)       Balance Overall balance assessment: Needs assistance Sitting-balance support: Feet supported Sitting balance-Leahy Scale: Fair   Postural control: Posterior lean Standing balance support: Bilateral upper extremity supported Standing balance-Leahy Scale: Poor Standing balance comment: balance fluctuated to poor with dizziness                             Pertinent Vitals/Pain Pain Assessment: No/denies pain    Home Living Family/patient expects to be discharged to:: Private residence Living Arrangements: Alone;Other (Comment) (children nearby) Available Help at Discharge: Family;Available PRN/intermittently Type of Home: House         Home Equipment: Kasandra Knudsen - single point      Prior Function Level of Independence: Independent with assistive device(s)         Comments: walked with cane but had episodes where she reported nearly falling over nearly passing out     Hand Dominance   Dominant Hand: Right  Extremity/Trunk Assessment   Upper Extremity Assessment Upper Extremity Assessment: Generalized weakness    Lower Extremity Assessment Lower Extremity Assessment: Generalized weakness    Cervical / Trunk Assessment Cervical / Trunk Assessment: Normal  Communication   Communication: HOH (B hearing aids)  Cognition Arousal/Alertness: Awake/alert Behavior During Therapy: WFL for tasks  assessed/performed Overall Cognitive Status: Within Functional Limits for tasks assessed                      General Comments      Exercises     Assessment/Plan    PT Assessment Patient needs continued PT services  PT Problem List Decreased strength;Decreased range of motion;Decreased activity tolerance;Decreased balance;Decreased mobility;Decreased safety awareness;Cardiopulmonary status limiting activity;Obesity       PT Treatment Interventions      PT Goals (Current goals can be found in the Care Plan section)  Acute Rehab PT Goals Patient Stated Goal: to walk and feel better PT Goal Formulation: With patient/family Time For Goal Achievement: 08/27/16 Potential to Achieve Goals: Good    Frequency Min 2X/week   Barriers to discharge Decreased caregiver support home alone with family    Co-evaluation               End of Session Equipment Utilized During Treatment: Gait belt;Oxygen Activity Tolerance: Patient limited by fatigue;Other (comment) (O2 dropped but not tremendously low, pulse 92) Patient left: in bed;with call bell/phone within reach;with bed alarm set;with family/visitor present Nurse Communication: Mobility status PT Visit Diagnosis: Other abnormalities of gait and mobility (R26.89)    Functional Assessment Tool Used: AM-PAC 6 Clicks Basic Mobility;Clinical judgement Functional Limitation: Mobility: Walking and moving around Mobility: Walking and Moving Around Current Status (E8257): At least 40 percent but less than 60 percent impaired, limited or restricted Mobility: Walking and Moving Around Goal Status [phone]): At least 1 percent but less than 20 percent impaired, limited or restricted    Time: 1016-1055 PT Time Calculation (min) (ACUTE ONLY): 39 min   Charges:   PT Evaluation $PT Eval Moderate Complexity: 1 Procedure PT Treatments $Gait Training: 8-22 mins   PT G Codes:   PT G-Codes **NOT FOR INPATIENT CLASS** Functional  Assessment Tool Used: AM-PAC 6 Clicks Basic Mobility;Clinical judgement Functional Limitation: Mobility: Walking and moving around Mobility: Walking and Moving Around Current Status (E1747): At least 40 percent but less than 60 percent impaired, limited or restricted Mobility: Walking and Moving Around Goal Status [phone]): At least 1 percent but less than 20 percent impaired, limited or restricted     Ramond Dial 08/13/2016, 12:24 PM   Mee Hives, PT MS Acute Rehab Dept. Number: [facility]

## 2016-08-13 NOTE — NC FL2 (Signed)
[place] LEVEL OF CARE SCREENING TOOL     IDENTIFICATION  Patient Name: [name] Birthdate: 1926-02-18 Sex: female Admission Date (Current Location): 08/12/2016  [place] and [place] Number:  Engineering geologist and Address:  [facility], [address] [place], [place] [address]      Provider Number: [id number]  Attending Physician Name and Address:  Theodoro Grist, MD  Relative Name and Phone Number:       Current Level of Care: Hospital Recommended Level of Care: [place] Prior Approval Number:    Date Approved/Denied:   PASRR Number:  [id number])  Discharge Plan: SNF    Current Diagnoses: Patient Active Problem List   Diagnosis Date Noted  . Pre-syncope 08/12/2016  . Chest pain, rule out acute myocardial infarction 07/04/2016  . Recurrent UTI 12/29/2015  . Dysuria 12/29/2015  . Acute kidney injury ([place]) 06/27/2015  . Hypoglycemia 06/27/2015  . OBSTRUCTIVE SLEEP APNEA 03/15/2007  . CORONARY ARTERY DISEASE 03/15/2007  . ASTHMA, CHRONIC OBSTRUCTIVE NOS 03/15/2007  . FIBROMYALGIA 03/15/2007    Orientation RESPIRATION BLADDER Height & Weight     Self, Time, Situation, Place  O2 (2 Liters Oxygen ) Continent Weight: 194 lb 1.6 oz (88 kg) Height:  '5\' 5"'$  (165.1 cm)  BEHAVIORAL SYMPTOMS/MOOD NEUROLOGICAL BOWEL NUTRITION STATUS   (none)  (none) Continent Diet (Diet: Heart Healthy )  AMBULATORY STATUS COMMUNICATION OF NEEDS Skin   Extensive Assist Verbally Normal                       Personal Care Assistance Level of Assistance  Bathing, Feeding, Dressing Bathing Assistance: Limited assistance Feeding assistance: Independent Dressing Assistance: Limited assistance     Functional Limitations Info  Sight, Hearing, Speech Sight Info: Adequate Hearing Info: Impaired Speech Info: Adequate    SPECIAL CARE FACTORS FREQUENCY  PT (By licensed PT), OT (By licensed OT)     PT Frequency:  (5) OT  Frequency:  (5)            Contractures      Additional Factors Info  Code Status, Allergies Code Status Info:  (Full Code. ) Allergies Info:  (Bactrim Sulfamethoxazole-trimethoprim, Darvon Propoxyphene, Albuterol, Fentanyl, Latex, Tetracyclines & Related)           Current Medications (08/13/2016):  This is the current hospital active medication list Current Facility-Administered Medications  Medication Dose Route Frequency Provider Last Rate Last Dose  . acetaminophen (TYLENOL) tablet 650 mg  650 mg Oral Q6H PRN Henreitta Leber, MD       Or  . acetaminophen (TYLENOL) suppository 650 mg  650 mg Rectal Q6H PRN Henreitta Leber, MD      . ALPRAZolam Duanne Moron) tablet 0.25 mg  0.25 mg Oral BID PRN Henreitta Leber, MD      . amLODipine (NORVASC) tablet 10 mg  10 mg Oral Daily Henreitta Leber, MD   10 mg at 08/13/16 1287   And  . benazepril (LOTENSIN) tablet 20 mg  20 mg Oral Daily Henreitta Leber, MD   20 mg at 08/13/16 8676  . aspirin EC tablet 81 mg  81 mg Oral Daily Henreitta Leber, MD   81 mg at 08/13/16 7209  . chlorhexidine (PERIDEX) 0.12 % solution 15 mL  15 mL Mouth Rinse BID Henreitta Leber, MD   15 mL at 08/13/16 1000  . clopidogrel (PLAVIX) tablet 75 mg  75 mg Oral Daily Vivek  Gaetano Net, MD   75 mg at 08/13/16 0938  . enoxaparin (LOVENOX) injection 40 mg  40 mg Subcutaneous Q24H Henreitta Leber, MD      . fenofibrate (TRICOR) tablet 145 mg  145 mg Oral Daily Henreitta Leber, MD   145 mg at 08/13/16 0937  . gabapentin (NEURONTIN) capsule 100 mg  100 mg Oral BID Henreitta Leber, MD   100 mg at 08/13/16 0937  . gi cocktail (Maalox,Lidocaine,Donnatal)  30 mL Oral TID PRN Henreitta Leber, MD      . glipiZIDE (GLUCOTROL XL) 24 hr tablet 10 mg  10 mg Oral Q breakfast Henreitta Leber, MD   10 mg at 08/13/16 0263  . iron polysaccharides (NIFEREX) capsule 150 mg  150 mg Oral Daily Henreitta Leber, MD   150 mg at 08/13/16 7858  . isosorbide mononitrate (IMDUR) 24 hr tablet 60 mg  60 mg  Oral BID Henreitta Leber, MD   60 mg at 08/13/16 0937  . levothyroxine (SYNTHROID, LEVOTHROID) tablet 125 mcg  125 mcg Oral QAC breakfast Henreitta Leber, MD   125 mcg at 08/13/16 [phone]  . meclizine (ANTIVERT) tablet 25 mg  25 mg Oral BID Henreitta Leber, MD   25 mg at 08/13/16 7741  . MEDLINE mouth rinse  15 mL Mouth Rinse q12n4p Henreitta Leber, MD   15 mL at 08/13/16 1600  . meloxicam (MOBIC) tablet 7.5 mg  7.5 mg Oral QODAY Henreitta Leber, MD   7.5 mg at 08/13/16 2878  . montelukast (SINGULAIR) tablet 10 mg  10 mg Oral Daily Henreitta Leber, MD   10 mg at 08/13/16 6767  . nitroGLYCERIN (NITROSTAT) SL tablet 0.4 mg  0.4 mg Sublingual Q5 min PRN Henreitta Leber, MD      . ondansetron [facility]) tablet 4 mg  4 mg Oral Q6H PRN Henreitta Leber, MD       Or  . ondansetron (ZOFRAN) injection 4 mg  4 mg Intravenous Q6H PRN Henreitta Leber, MD      . pantoprazole (PROTONIX) EC tablet 40 mg  40 mg Oral Daily Henreitta Leber, MD   40 mg at 08/13/16 0939  . ranolazine (RANEXA) 12 hr tablet 500 mg  500 mg Oral BID Henreitta Leber, MD   500 mg at 08/13/16 2094  . rosuvastatin (CRESTOR) tablet 20 mg  20 mg Oral Daily Henreitta Leber, MD   20 mg at 08/13/16 0937  . sodium chloride flush (NS) 0.9 % injection 3 mL  3 mL Intravenous Q12H Henreitta Leber, MD   3 mL at 08/13/16 1000  . tiotropium (SPIRIVA) inhalation capsule 18 mcg  18 mcg Inhalation Daily Henreitta Leber, MD   18 mcg at 08/13/16 0939  . traMADol-acetaminophen (ULTRACET) 37.5-325 MG per tablet 1 tablet  1 tablet Oral Q6H PRN Henreitta Leber, MD   1 tablet at 08/13/16 7096     Discharge Medications: Please see discharge summary for a list of discharge medications.  Relevant Imaging Results:  Relevant Lab Results:   Additional Information  (SSN: [id number])  Tamy Accardo, Veronia Beets, LCSW

## 2016-08-13 NOTE — Clinical Social Work Note (Signed)
Clinical Social Work Assessment  Patient Details  Name: [name] MRN: [id number] Date of Birth: 04/10/1926  Date of referral:  08/13/16               Reason for consult:  Facility Placement                Permission sought to share information with:    Permission granted to share information::     Name::        Agency::     Relationship::     Contact Information:     Housing/Transportation Living arrangements for the past 2 months:  Single Family Home Source of Information:  Adult Children, Power of Attorney Patient Interpreter Needed:  None Criminal Activity/Legal Involvement Pertinent to Current Situation/Hospitalization:  No - Comment as needed Significant Relationships:  Adult Children Lives with:  Self Do you feel safe going back to the place where you live?    Need for family participation in patient care:  Yes (Comment)  Care giving concerns:  Patient lives alone in [place] and has 4 adult children that live near by.    Social Worker assessment / plan:  Clinical Social Worker (CSW) received SNF consult. Patient is under medicare observation. Patient's previous admission was also under observation. CSW met with patient's 3 adult daughters in the room. Patient was off the floor for a procedure. Patient's daughter Linda is [phone] [phone]. CSW explained to daughters that medicare will not pay for SNF unless patient has a 3 night qualfying inpatient stay at a hospital. Daughters verbalized their understanding. CSW explained that patient can pay out of pocket for SNF or go home with home health. Per daughters patient cannot pay privately for SNF and is open to [facility] home health already. Daughters requested more home health services if patient returns home including an aide. CSW will continue to follow and assist as needed.   Employment status:  Retired Insurance information:  Medicare PT Recommendations:  Skilled Nursing Facility Information / Referral to community  resources:  Other (Comment Required) (home health (patient is medicare observation) )  Patient/Family's Response to care:  Patient's daughters understand that medicare will not pay for SNF under observation.   Patient/Family's Understanding of and Emotional Response to Diagnosis, Current Treatment, and Prognosis:  Patient's daughters were very pleasant and thanked CSW for visit.   Emotional Assessment Appearance:    Attitude/Demeanor/Rapport:  Unable to Assess Affect (typically observed):  Unable to Assess Orientation:  Oriented to Self, Oriented to  Time, Oriented to Place, Oriented to Situation Alcohol / Substance use:  Not Applicable Psych involvement (Current and /or in the community):  No (Comment)  Discharge Needs  Concerns to be addressed:  Discharge Planning Concerns Readmission within the last 30 days:  Yes Current discharge risk:  Dependent with Mobility Barriers to Discharge:  Continued Medical Work up   Sample, Bailey M, LCSW 08/13/2016, 4:23 PM  

## 2016-08-13 NOTE — Care Management Obs Status (Signed)
[place] NOTIFICATION   Patient Details  Name: [name] MRN: [id number] Date of Birth: [birth year]   Medicare Observation Status Notification Given:  Yes    Jaclyne Haverstick A, RN 08/13/2016, 4:20 PM

## 2016-08-13 NOTE — Progress Notes (Signed)
[place] [facility] [place] NAME: [name]    MR#:  [id number]  DATE OF BIRTH:  [birth year]  SUBJECTIVE:  CHIEF COMPLAINT:   Chief Complaint  Patient presents with  . Weakness  . Chest Pain   The patient is 83 year old Caucasian female with a history significant for history of coronary artery disease, status post stent placements, hypertension, COPD, lung cancer, diabetes, hyperlipidemia, obstructive sleep apnea, chronic pain syndrome, who presents to the hospitalist presyncope on exertion . She has very similar episode in the hospital while she was walking. She was seen by cardiologist, recommended no further intervention was felt to be hyperkalemia related. Junctional rhythm has resolved, likely hyperkalemia related per cardiology. Review of Systems  Constitutional: Positive for malaise/fatigue. Negative for chills, fever and weight loss.  HENT: Negative for congestion.   Eyes: Negative for blurred vision and double vision.  Respiratory: Negative for cough, sputum production, shortness of breath and wheezing.   Cardiovascular: Positive for palpitations. Negative for chest pain, orthopnea, leg swelling and PND.  Gastrointestinal: Negative for abdominal pain, blood in stool, constipation, diarrhea, nausea and vomiting.  Genitourinary: Negative for dysuria, frequency, hematuria and urgency.  Musculoskeletal: Negative for falls.  Neurological: Positive for sensory change. Negative for dizziness, tremors, focal weakness and headaches.  Endo/Heme/Allergies: Does not bruise/bleed easily.  Psychiatric/Behavioral: Negative for depression. The patient does not have insomnia.     VITAL SIGNS: Blood pressure 134/62, pulse 98, temperature 98.4 F (36.9 C), temperature source Oral, resp. rate 18, height '5\' 5"'$  (1.651 m), weight 88 kg (194 lb 1.6 oz), SpO2 98 %.  PHYSICAL EXAMINATION:   GENERAL:  83 y.o.-year-old patient lying in the bed with no  acute distress.  EYES: Pupils equal, round, reactive to light and accommodation. No scleral icterus. Extraocular muscles intact.  HEENT: Head atraumatic, normocephalic. Oropharynx and nasopharynx clear.  NECK:  Supple, no jugular venous distention. No thyroid enlargement, no tenderness.  LUNGS: Normal breath sounds bilaterally, no wheezing, rales,rhonchi or crepitation. No use of accessory muscles of respiration.  CARDIOVASCULAR: S1, S2 normal. No murmurs, rubs, or gallops.  ABDOMEN: Soft, nontender, nondistended. Bowel sounds present. No organomegaly or mass.  EXTREMITIES: No pedal edema, cyanosis, or clubbing.  NEUROLOGIC: Cranial nerves II through XII are intact. Muscle strength 5/5 in all extremities. Sensation intact. Gait not checked.  PSYCHIATRIC: The patient is alert and oriented x 3.  SKIN: No obvious rash, lesion, or ulcer.   ORDERS/RESULTS REVIEWED:   CBC  Recent Labs Lab 08/12/16 1530  WBC 4.2  HGB 9.9*  HCT 29.5*  PLT 230  MCV 89.2  MCH 30.0  MCHC 33.7  RDW 13.0   ------------------------------------------------------------------------------------------------------------------  Chemistries   Recent Labs Lab 08/12/16 1530 08/13/16 0302  NA 134* 140  K 5.8* 5.1  CL 103 107  CO2 25 28  GLUCOSE 131* 97  BUN 26* 20  CREATININE 1.59* 1.18*  CALCIUM 9.0 8.8*   ------------------------------------------------------------------------------------------------------------------ estimated creatinine clearance is 36.1 mL/min (A) (by C-G formula based on SCr of 1.18 mg/dL (H)). ------------------------------------------------------------------------------------------------------------------ No results for input(s): TSH, T4TOTAL, T3FREE, THYROIDAB in the last 72 hours.  Invalid input(s): FREET3  Cardiac Enzymes  Recent Labs Lab 08/12/16 1855 08/12/16 2258 08/13/16 0302  TROPONINI <0.03 <0.03 <0.03    ------------------------------------------------------------------------------------------------------------------ Invalid input(s): POCBNP ---------------------------------------------------------------------------------------------------------------  RADIOLOGY: Dg Chest 2 View  Result Date: 08/12/2016 CLINICAL DATA:  Abnormal EKG. Heaviness in the chest. Near syncopal episode last night. Right lung cancer. EXAM: CHEST  2 VIEW COMPARISON:  07/04/2016. FINDINGS: Enlarged cardiac silhouette with a mild increase in size. No gross change in the previously demonstrated right hilar mass or perihilar fibrosis. Stable right jugular porta catheter. Coronary artery stent. Stable minimally prominent interstitial markings. Aortic arch calcifications. Interval linear density in the right mid lung zone. Diffuse osteopenia. Thoracic spine degenerative changes. IMPRESSION: 1. Interval linear atelectasis or scarring in the right mid lung zone. 2. Progressive cardiomegaly. 3. Stable right hilar mass or fibrosis. 4. Aortic atherosclerosis. Electronically Signed   By: Claudie Revering M.D.   On: 08/12/2016 15:28    EKG:  Orders placed or performed during the hospital encounter of 08/12/16  . ED EKG within 10 minutes  . ED EKG within 10 minutes  . EKG 12-Lead  . EKG 12-Lead  . EKG 12-Lead  . EKG 12-Lead  . EKG 12-Lead  . EKG 12-Lead  . EKG 12-Lead  . EKG 12-Lead    ASSESSMENT AND PLAN:  Active Problems:   Pre-syncope #1. Near syncope of unclear etiology. This time, presents today, vital signs were unremarkable, per nursing staff. Getting carotid ultrasound, echocardiogram was performed about a month ago, revealing normal ejection fraction, diastolic dysfunction, discussed with cardiologist, who had CT angiogram of the heart, patent stents, per cardiologist, conservative measures were recommended, discussed with Dr. Humphrey Rolls today. May benefit from prolonged cardiac monitoring as outpatient. Patient was seen by  physical therapist, recommending skilled nursing facility placement for rehabilitation, getting social worker involved for recommendations-placement #2. Hyponatremia, resolved #3. Hyperkalemia, resolved #4 acute on chronic renal insufficiency, improved to baseline #5. Anemia, recheck hemoglobin level in the morning   Management plans discussed with the patient, family and they are in agreement.   DRUG ALLERGIES:  Allergies  Allergen Reactions  . Bactrim [Sulfamethoxazole-Trimethoprim]     "Diabetic seizures"  . Darvon [Propoxyphene] Other (See Comments)    Patient states she was real weak, felt like she was going to pass out.  . Albuterol Palpitations and Other (See Comments)    Irregular heart beat, CAN TOLERATE XOPENEX  . Fentanyl Nausea And Vomiting and Rash  . Latex Rash  . Tetracyclines & Related Rash    CODE STATUS:     Code Status Orders        Start     Ordered   08/12/16 1848  Full code  Continuous     08/12/16 1847    Code Status History    Date Active Date Inactive Code Status Order ID Comments User Context   07/05/2016 12:07 AM 07/06/2016  8:06 PM Full Code [id number]  Harvie Bridge, DO Inpatient   06/27/2015  5:31 PM 06/29/2015  8:52 PM Full Code [id number]  Lytle Butte, MD ED    Advance Directive Documentation     Most Recent Value  Type of Advance Directive  Living will  Pre-existing out of facility DNR order (yellow form or pink MOST form)  -  "MOST" Form in Place?  -      TOTAL TIME TAKING CARE OF THIS PATIENT: 50 minutes.  Discussed with  multiple family members and cardiologist today, all questions were answered  Darryll Raju M.D on 08/13/2016 at 3:01 PM  Between 7am to 6pm - Pager - [phone]  After 6pm go to [email or web address] - password [email or web address] [facility]  Office  [phone]  CC: Primary care physician; Lorelee Market, MD

## 2016-08-13 NOTE — Progress Notes (Signed)
Anticoagulation monitoring(Lovenox):  83yo  F ordered Lovenox 30 mg Q24h  Filed Weights   08/12/16 1449 08/12/16 1846  Weight: 190 lb (86.2 kg) 194 lb 1.6 oz (88 kg)   BMI 32.4   Lab Results  Component Value Date   CREATININE 1.18 (H) 08/13/2016   CREATININE 1.59 (H) 08/12/2016   CREATININE 1.46 (H) 07/04/2016   Estimated Creatinine Clearance: 36.1 mL/min (A) (by C-G formula based on SCr of 1.18 mg/dL (H)). Hemoglobin & Hematocrit     Component Value Date/Time   HGB 9.9 (L) 08/12/2016 1530   HGB 9.6 (L) 12/11/2013 0507   HGB 10.4 (L) 08/16/2010 0917   HCT 29.5 (L) 08/12/2016 1530   HCT 29.1 (L) 12/11/2013 0507   HCT 29.9 (L) 08/16/2010 0254     Per Protocol for Patient with estCrcl now >30 ml/min and BMI < 40, will transition to Lovenox 40 mg Q24h      Chinita Greenland PharmD Clinical Pharmacist 08/13/2016

## 2016-08-13 NOTE — Consult Note (Signed)
[name] is a 83 y.o. female  [id number]  Primary Cardiologist: Dr. Neoma Laming  Reason for Consultation: Presyncope with junctional rhythm  HPI: 83 yo white female with a history of CAD, HTN, COPD, hyperlipidemia, diabetes, CKD, and lung cancer. She presented to our office yesterday afternoon with a history of presyncope. EKG revealed junctional rhythm. Pt requested to be admitted for observation and was sent to the ED for further evaluation.     Review of Systems: Pt reports feeling much better. She reports mild chest congestion likely due to a "cold."   Past Medical History:  Diagnosis Date  . Arrhythmia   . Arthritis   . Asthma   . Atrial fibrillation ([place])   . Cancer ([place])    lung cancer  . COPD (chronic obstructive pulmonary disease) ([place])   . Coronary artery disease   . Depression   . Diabetes mellitus without complication ([place])   . Heart attack   . Heart disease   . Heart failure ([place])   . Heartburn   . HLD (hyperlipidemia)   . Hypertension   . Hyperthyroidism   . Kidney failure   . Sleep apnea   . Stroke [facility])     Medications Prior to Admission  Medication Sig Dispense Refill  . ALPRAZolam (XANAX) 0.25 MG tablet Take 1 tablet (0.25 mg total) by mouth 2 (two) times daily as needed for anxiety. 20 tablet 0  . amLODipine-benazepril (LOTREL) 10-20 MG capsule Take 1 capsule by mouth every evening.    Marland Kitchen aspirin EC 81 MG EC tablet Take 1 tablet (81 mg total) by mouth daily. 30 tablet 0  . Choline Fenofibrate (FENOFIBRIC ACID) 135 MG CPDR Take 135 mg by mouth daily. Reported on 11/24/2015    . clopidogrel (PLAVIX) 75 MG tablet Take 1 tablet (75 mg total) by mouth daily. 30 tablet 0  . estradiol (ESTRACE VAGINAL) 0.1 MG/GM vaginal cream Apply 0.'5mg'$  (pea-sized amount)  just inside the vaginal introitus with a finger-tip every night for two weeks and then Monday, Wednesday and Friday nights. 30 g 12  . gabapentin (NEURONTIN) 100 MG capsule Take 100 mg by mouth 2  (two) times daily.    Marland Kitchen glipiZIDE (GLUCOTROL XL) 10 MG 24 hr tablet Take 10 mg by mouth daily with breakfast.    . iron polysaccharides (NIFEREX) 150 MG capsule Take 1 capsule (150 mg total) by mouth daily. 30 capsule 0  . isosorbide mononitrate (IMDUR) 60 MG 24 hr tablet Take 60 mg by mouth 2 (two) times daily.    Marland Kitchen levalbuterol (XOPENEX HFA) 45 MCG/ACT inhaler Inhale 1 puff into the lungs 4 (four) times daily as needed. For wheezing/shortness of breath.    . levalbuterol (XOPENEX) 1.25 MG/3ML nebulizer solution Take 1.25 mg by nebulization every 4 (four) hours as needed for wheezing or shortness of breath.    . levothyroxine (SYNTHROID, LEVOTHROID) 125 MCG tablet Take 125 mcg by mouth daily before breakfast.    . meclizine (ANTIVERT) 25 MG tablet Take 25 mg by mouth 2 (two) times daily.    . meloxicam (MOBIC) 7.5 MG tablet Take 7.5 mg by mouth every other day.    . montelukast (SINGULAIR) 10 MG tablet Take 10 mg by mouth daily.    . nitroGLYCERIN (NITROSTAT) 0.4 MG SL tablet Place 1 tablet (0.4 mg total) under the tongue every 5 (five) minutes as needed for chest pain. 10 tablet 12  . pantoprazole (PROTONIX) 40 MG tablet Take 1 tablet (40  mg total) by mouth daily. 30 tablet 0  . ranolazine (RANEXA) 500 MG 12 hr tablet Take 1 tablet (500 mg total) by mouth 2 (two) times daily. 60 tablet 0  . rosuvastatin (CRESTOR) 20 MG tablet Take 20 mg by mouth daily.     Marland Kitchen SPIRIVA HANDIHALER 18 MCG inhalation capsule Place 18 mcg into inhaler and inhale daily.    Marland Kitchen torsemide (DEMADEX) 20 MG tablet Take 10-20 mg by mouth daily as needed. For edema/fluid.    . traMADol-acetaminophen (ULTRACET) 37.5-325 MG tablet Take 1 tablet by mouth every 6 (six) hours as needed.    . Alum & Mag Hydroxide-Simeth (GI COCKTAIL) SUSP suspension Take 30 mLs by mouth 3 (three) times daily as needed for indigestion (or chest pain). Shake well. 90 mL 0  . HYDROmorphone (DILAUDID) 2 MG tablet Take 0.5 tablets (1 mg total) by mouth  every 6 (six) hours as needed for severe pain. (Patient not taking: Reported on 08/12/2016) 10 tablet 0     . amLODipine  10 mg Oral Daily   And  . benazepril  20 mg Oral Daily  . aspirin EC  81 mg Oral Daily  . chlorhexidine  15 mL Mouth Rinse BID  . clopidogrel  75 mg Oral Daily  . enoxaparin (LOVENOX) injection  30 mg Subcutaneous Q24H  . fenofibrate  145 mg Oral Daily  . gabapentin  100 mg Oral BID  . glipiZIDE  10 mg Oral Q breakfast  . iron polysaccharides  150 mg Oral Daily  . isosorbide mononitrate  60 mg Oral BID  . levothyroxine  125 mcg Oral QAC breakfast  . meclizine  25 mg Oral BID  . mouth rinse  15 mL Mouth Rinse q12n4p  . meloxicam  7.5 mg Oral QODAY  . montelukast  10 mg Oral Daily  . pantoprazole  40 mg Oral Daily  . ranolazine  500 mg Oral BID  . rosuvastatin  20 mg Oral Daily  . sodium chloride flush  3 mL Intravenous Q12H  . tiotropium  18 mcg Inhalation Daily    Infusions:   Allergies  Allergen Reactions  . Bactrim [Sulfamethoxazole-Trimethoprim]     "Diabetic seizures"  . Darvon [Propoxyphene] Other (See Comments)    Patient states she was real weak, felt like she was going to pass out.  . Albuterol Palpitations and Other (See Comments)    Irregular heart beat, CAN TOLERATE XOPENEX  . Fentanyl Nausea And Vomiting and Rash  . Latex Rash  . Tetracyclines & Related Rash    Social History   Social History  . Marital status: Widowed    Spouse name: N/A  . Number of children: N/A  . Years of education: N/A   Occupational History  . Not on file.   Social History Main Topics  . Smoking status: Never Smoker  . Smokeless tobacco: Never Used  . Alcohol use No  . Drug use: No  . Sexual activity: Not on file   Other Topics Concern  . Not on file   Social History Narrative  . No narrative on file    Family History  Problem Relation Age of Onset  . Diabetes Other   . Hypertension Other   . Hematuria Father   . Kidney failure Father   .  Kidney cancer Brother   . Kidney failure Mother   . Kidney Stones Brother     PHYSICAL EXAM: Vitals:   08/12/16 2009 08/13/16 0510  BP: (!) 131/52 Marland Kitchen)  121/46  Pulse: 89 82  Resp: 16 16  Temp: 98.3 F (36.8 C) 98.1 F (36.7 C)     Intake/Output Summary (Last 24 hours) at 08/13/16 0721 Last data filed at 08/13/16 0515  Gross per 24 hour  Intake                0 ml  Output             1500 ml  Net            -1500 ml    General:  Well appearing. No respiratory difficulty HEENT: normal Neck: supple. no JVD.   Cor: PMI nondisplaced. Regular rate & rhythm. No rubs, gallops or murmurs. Lungs: clear Abdomen: soft, nontender, nondistended. No hepatosplenomegaly. No bruits or masses. Good bowel sounds. Extremities: no cyanosis, clubbing, rash, edema Neuro: alert & oriented x 3, cranial nerves grossly intact. moves all 4 extremities w/o difficulty. Affect pleasant.  ECG: Normal sinus rhythm 84bpm with prolonged PR interval.   Results for orders placed or performed during the hospital encounter of 08/12/16 (from the past 24 hour(s))  Basic metabolic panel     Status: Abnormal   Collection Time: 08/12/16  3:30 PM  Result Value Ref Range   Sodium 134 (L) 135 - 145 mmol/L   Potassium 5.8 (H) 3.5 - 5.1 mmol/L   Chloride 103 101 - 111 mmol/L   CO2 25 22 - 32 mmol/L   Glucose, Bld 131 (H) 65 - 99 mg/dL   BUN 26 (H) 6 - 20 mg/dL   Creatinine, Ser 1.59 (H) 0.44 - 1.00 mg/dL   Calcium 9.0 8.9 - 10.3 mg/dL   GFR calc non Af Amer 28 (L) >60 mL/min   GFR calc Af Amer 32 (L) >60 mL/min   Anion gap 6 5 - 15  CBC     Status: Abnormal   Collection Time: 08/12/16  3:30 PM  Result Value Ref Range   WBC 4.2 3.6 - 11.0 K/uL   RBC 3.30 (L) 3.80 - 5.20 MIL/uL   Hemoglobin 9.9 (L) 12.0 - 16.0 g/dL   HCT 29.5 (L) 35.0 - 47.0 %   MCV 89.2 80.0 - 100.0 fL   MCH 30.0 26.0 - 34.0 pg   MCHC 33.7 32.0 - 36.0 g/dL   RDW 13.0 11.5 - 14.5 %   Platelets 230 150 - 440 K/uL  Troponin I     Status:  None   Collection Time: 08/12/16  3:30 PM  Result Value Ref Range   Troponin I <0.03 <0.03 ng/mL  Troponin I     Status: None   Collection Time: 08/12/16  6:55 PM  Result Value Ref Range   Troponin I <0.03 <0.03 ng/mL  Troponin I     Status: None   Collection Time: 08/12/16 10:58 PM  Result Value Ref Range   Troponin I <0.03 <0.03 ng/mL  Troponin I     Status: None   Collection Time: 08/13/16  3:02 AM  Result Value Ref Range   Troponin I <0.03 <0.03 ng/mL   Dg Chest 2 View  Result Date: 08/12/2016 CLINICAL DATA:  Abnormal EKG. Heaviness in the chest. Near syncopal episode last night. Right lung cancer. EXAM: CHEST  2 VIEW COMPARISON:  07/04/2016. FINDINGS: Enlarged cardiac silhouette with a mild increase in size. No gross change in the previously demonstrated right hilar mass or perihilar fibrosis. Stable right jugular porta catheter. Coronary artery stent. Stable minimally prominent interstitial markings. Aortic arch calcifications. Interval  linear density in the right mid lung zone. Diffuse osteopenia. Thoracic spine degenerative changes. IMPRESSION: 1. Interval linear atelectasis or scarring in the right mid lung zone. 2. Progressive cardiomegaly. 3. Stable right hilar mass or fibrosis. 4. Aortic atherosclerosis. Electronically Signed   By: Claudie Revering M.D.   On: 08/12/2016 15:28     ASSESSMENT AND PLAN: Junctional rhythm possibly due to hyperkalemia. Kayexalate given in the ED. Potassium results pending from this mornings labs. Troponins are negative. Pt is currently in sinus rhythm and reports feeling better. Will continue to monitor labs and rhythm. If heart rate remains stable may consider discharge home tomorrow. Discussed possibility of a pacemaker with pt and she states she would like to avoid surgery if possible.   Jake Bathe

## 2016-08-14 ENCOUNTER — Observation Stay: Payer: Medicare Other

## 2016-08-14 LAB — CREATININE, SERUM
Creatinine, Ser: 1.09 mg/dL — ABNORMAL HIGH (ref 0.44–1.00)
GFR calc Af Amer: 51 mL/min — ABNORMAL LOW (ref >60)
GFR calc non Af Amer: 44 mL/min — ABNORMAL LOW (ref >60)

## 2016-08-14 LAB — URINALYSIS, COMPLETE (UACMP) WITH MICROSCOPIC
Bacteria, UA: NONE SEEN
Bilirubin Urine: NEGATIVE
GLUCOSE, UA: NEGATIVE mg/dL
HGB URINE DIPSTICK: NEGATIVE
KETONES UR: NEGATIVE mg/dL
LEUKOCYTES UA: NEGATIVE
NITRITE: NEGATIVE
PH: 7 (ref 5.0–8.0)
Protein, ur: NEGATIVE mg/dL
Specific Gravity, Urine: 1.006 (ref 1.005–1.030)

## 2016-08-14 LAB — TROPONIN I
Troponin I: <0.03 ng/mL (ref <0.03)
Troponin I: <0.03 ng/mL (ref <0.03)

## 2016-08-14 LAB — HEMOGLOBIN: HEMOGLOBIN: 9.4 g/dL — AB (ref 12.0–16.0)

## 2016-08-14 LAB — POTASSIUM: POTASSIUM: 4.6 mmol/L (ref 3.5–5.1)

## 2016-08-14 MED ORDER — OXYBUTYNIN CHLORIDE 5 MG PO TABS
5.0000 mg | ORAL_TABLET | Freq: Three times a day (TID) | ORAL | Status: DC | PRN
  Filled 2016-08-14: qty 1

## 2016-08-14 NOTE — Plan of Care (Signed)
Problem: Safety: Goal: Ability to remain free from injury will improve Outcome: Progressing Bed Alarm in use and call light in reach

## 2016-08-14 NOTE — Clinical Social Work Note (Signed)
CSW received referral for SNF.  Case discussed with case manager and plan is to discharge home with home health, patient's family prefers [facility].  CSW to sign off please re-consult if social work needs arise.  Jones Broom. Kake, MSW, [place]

## 2016-08-14 NOTE — Progress Notes (Signed)
SUBJECTIVE: Pt reports feeling tired. She had an episode of weakness and palpitations last night but EKG and troponin were negative for new finginds.   Vitals:   08/13/16 1239 08/13/16 1329 08/13/16 1929 08/14/16 0344  BP: 129/64 134/62 (!) 141/66 (!) 128/58  Pulse: 95 98 91 88  Resp:   18 18  Temp:   98.4 F (36.9 C) 98.3 F (36.8 C)  TempSrc:   Oral Oral  SpO2:   97% 99%  Weight:      Height:        Intake/Output Summary (Last 24 hours) at 08/14/16 0726 Last data filed at 08/14/16 0539  Gross per 24 hour  Intake              720 ml  Output              400 ml  Net              320 ml    LABS: Basic Metabolic Panel:  Recent Labs  08/12/16 1530 08/13/16 0302 08/14/16 0246  NA 134* 140  --   K 5.8* 5.1  --   CL 103 107  --   CO2 25 28  --   GLUCOSE 131* 97  --   BUN 26* 20  --   CREATININE 1.59* 1.18* 1.09*  CALCIUM 9.0 8.8*  --    Liver Function Tests: No results for input(s): AST, ALT, ALKPHOS, BILITOT, PROT, ALBUMIN in the last 72 hours. No results for input(s): LIPASE, AMYLASE in the last 72 hours. CBC:  Recent Labs  08/12/16 1530 08/14/16 0246  WBC 4.2  --   HGB 9.9* 9.4*  HCT 29.5*  --   MCV 89.2  --   PLT 230  --    Cardiac Enzymes:  Recent Labs  08/12/16 2258 08/13/16 0302 08/14/16 0246  TROPONINI <0.03 <0.03 <0.03   BNP: Invalid input(s): POCBNP D-Dimer: No results for input(s): DDIMER in the last 72 hours. Hemoglobin A1C: No results for input(s): HGBA1C in the last 72 hours. Fasting Lipid Panel: No results for input(s): CHOL, HDL, LDLCALC, TRIG, CHOLHDL, LDLDIRECT in the last 72 hours. Thyroid Function Tests: No results for input(s): TSH, T4TOTAL, T3FREE, THYROIDAB in the last 72 hours.  Invalid input(s): FREET3 Anemia Panel: No results for input(s): VITAMINB12, FOLATE, FERRITIN, TIBC, IRON, RETICCTPCT in the last 72 hours.   PHYSICAL EXAM General: Well developed, well nourished, in no acute distress HEENT:  Normocephalic  and atramatic Neck:  No JVD.  Lungs: Clear bilaterally to auscultation and percussion. Heart: HRRR . Normal S1 and S2 without gallops or murmurs.  Abdomen: Bowel sounds are positive, abdomen soft and non-tender  Extremities: No clubbing, cyanosis or edema.   Neuro: Alert and oriented X 3. Psych:  Good affect, responds appropriately  TELEMETRY: NSR 88bpm with first degree heart block.   ASSESSMENT AND PLAN: Pt reported an episode of weakness of dizziness last night but EKG did not show any changes and troponin was negative. Hyperkalemia has been resolved. May consider need for assisted living if a cause for presyncope spells are not identified.    May consider being discharged home with close outpatient follow up if the patient has sufficient support at home.  Active Problems:   Pre-syncope    Jake Bathe, NP-C 08/14/2016 7:26 AM

## 2016-08-14 NOTE — Progress Notes (Addendum)
[facility] [place] NAME: [name]    MR#:  [id number]  DATE OF BIRTH:  03/16/1926  SUBJECTIVE:  CHIEF COMPLAINT:   Chief Complaint  Patient presents with  . Weakness  . Chest Pain   The patient is 83 year old Caucasian female with a history significant for history of coronary artery disease, status post stent placements, hypertension, COPD, lung cancer, diabetes, hyperlipidemia, obstructive sleep apnea, chronic pain syndrome, who presents to the hospitalist presyncope on exertion . She has very similar episode in the hospital while she was walking. She was seen by cardiologist, recommended no further intervention was felt to be hyperkalemia related. Junctional rhythm has resolved, likely hyperkalemia related per cardiology. The patient feels good today, complains of intermittent episodes of feeling like she has been drained of energy, even not on exertion. She blames Ranexa. It was stopped. She also complains of intermittent urination, urgency. Bladder scan revealed only 6 cc. Some dysuria   Review of Systems  Constitutional: Positive for malaise/fatigue. Negative for chills, fever and weight loss.  HENT: Negative for congestion.   Eyes: Negative for blurred vision and double vision.  Respiratory: Negative for cough, sputum production, shortness of breath and wheezing.   Cardiovascular: Negative for chest pain, palpitations, orthopnea, leg swelling and PND.  Gastrointestinal: Negative for abdominal pain, blood in stool, constipation, diarrhea, nausea and vomiting.  Genitourinary: Positive for dysuria, frequency and urgency. Negative for hematuria.  Musculoskeletal: Negative for falls.  Neurological: Positive for sensory change. Negative for dizziness, tremors, focal weakness and headaches.  Endo/Heme/Allergies: Does not bruise/bleed easily.  Psychiatric/Behavioral: Negative for depression. The patient does not have insomnia.      VITAL SIGNS: Blood pressure (!) 121/58, pulse 81, temperature 98.6 F (37 C), temperature source Oral, resp. rate 18, height '5\' 5"'$  (1.651 m), weight 88 kg (194 lb 1.6 oz), SpO2 100 %.  PHYSICAL EXAMINATION:   GENERAL:  83 y.o.-year-old patient lying in the bed with no acute distress.  EYES: Pupils equal, round, reactive to light and accommodation. No scleral icterus. Extraocular muscles intact.  HEENT: Head atraumatic, normocephalic. Oropharynx and nasopharynx clear.  NECK:  Supple, no jugular venous distention. No thyroid enlargement, no tenderness.  LUNGS: Normal breath sounds bilaterally, no wheezing, rales,rhonchi or crepitation. No use of accessory muscles of respiration.  CARDIOVASCULAR: S1, S2 normal. No murmurs, rubs, or gallops.  ABDOMEN: Soft, nontender, nondistended. Bowel sounds present. No organomegaly or mass.  EXTREMITIES: No pedal edema, cyanosis, or clubbing.  NEUROLOGIC: Cranial nerves II through XII are intact. Muscle strength 5/5 in all extremities. Sensation intact. Gait not checked.  PSYCHIATRIC: The patient is alert and oriented x 3.  SKIN: No obvious rash, lesion, or ulcer.   ORDERS/RESULTS REVIEWED:   CBC  Recent Labs Lab 08/12/16 1530 08/14/16 0246  WBC 4.2  --   HGB 9.9* 9.4*  HCT 29.5*  --   PLT 230  --   MCV 89.2  --   MCH 30.0  --   MCHC 33.7  --   RDW 13.0  --    ------------------------------------------------------------------------------------------------------------------  Chemistries   Recent Labs Lab 08/12/16 1530 08/13/16 0302 08/14/16 0246 08/14/16 0826  NA 134* 140  --   --   K 5.8* 5.1  --  4.6  CL 103 107  --   --   CO2 25 28  --   --   GLUCOSE 131* 97  --   --   BUN 26* 20  --   --  CREATININE 1.59* 1.18* 1.09*  --   CALCIUM 9.0 8.8*  --   --    ------------------------------------------------------------------------------------------------------------------ estimated creatinine clearance is 39.1 mL/min (A) (by  C-G formula based on SCr of 1.09 mg/dL (H)). ------------------------------------------------------------------------------------------------------------------ No results for input(s): TSH, T4TOTAL, T3FREE, THYROIDAB in the last 72 hours.  Invalid input(s): FREET3  Cardiac Enzymes  Recent Labs Lab 08/13/16 0302 08/14/16 0246 08/14/16 0826  TROPONINI <0.03 <0.03 <0.03   ------------------------------------------------------------------------------------------------------------------ Invalid input(s): POCBNP ---------------------------------------------------------------------------------------------------------------  RADIOLOGY: Dg Chest 2 View  Result Date: 08/12/2016 CLINICAL DATA:  Abnormal EKG. Heaviness in the chest. Near syncopal episode last night. Right lung cancer. EXAM: CHEST  2 VIEW COMPARISON:  07/04/2016. FINDINGS: Enlarged cardiac silhouette with a mild increase in size. No gross change in the previously demonstrated right hilar mass or perihilar fibrosis. Stable right jugular porta catheter. Coronary artery stent. Stable minimally prominent interstitial markings. Aortic arch calcifications. Interval linear density in the right mid lung zone. Diffuse osteopenia. Thoracic spine degenerative changes. IMPRESSION: 1. Interval linear atelectasis or scarring in the right mid lung zone. 2. Progressive cardiomegaly. 3. Stable right hilar mass or fibrosis. 4. Aortic atherosclerosis. Electronically Signed   By: Claudie Revering M.D.   On: 08/12/2016 15:28   Mr Jodene Nam Head Wo Contrast  Result Date: 08/14/2016 CLINICAL DATA:  History of hypertension.  Near syncope.  Weakness. EXAM: MRA NECK WITHOUT  CONTRAST MRA HEAD WITHOUT CONTRAST TECHNIQUE: Multiplanar and multiecho pulse sequences of the neck were obtained without intravenous contrast. Angiographic images of the neck were obtained using MRA technique without intravenous contast.; Angiographic images of the Circle of Willis were obtained using  MRA technique without intravenous contrast. COMPARISON:  12/11/2013 FINDINGS: MRA NECK FINDINGS Limited detail without contrast. There is antegrade flow in both common carotid arteries. Both vessels are widely patent to the bifurcation regions. There is 20% stenosis of the proximal ICA on the right. There is 40% stenosis of the proximal ICA on the left. Cervical internal carotid artery's widely patent beyond that. There is some signal loss at the skullbase presumed secondary to susceptibility artifact. Both vertebral arteries are approximately equal in size and show antegrade flow without stenosis. MRA HEAD FINDINGS Both internal carotid arteries are widely patent through the skullbase and siphon regions. The anterior and middle cerebral vessels are patent without proximal stenosis, aneurysm or vascular malformation. Both vertebral arteries are patent to the basilar with the left being dominant. Incidental fenestration of the distal left vertebral artery. No stenosis. Posterior circulation branch vessels show flow. IMPRESSION: Normal intracranial MR angiography of the large and medium size vessels. MR angiography of the neck vessels without contrast does not show any likely significant finding. Detail is limited without contrast. Suspect 20% stenosis at the proximal ICA on the right and 40% stenosis on the left. Noninvasive ultrasound study likely more accurate than this noncontrast neck exam. Electronically Signed   By: Nelson Chimes M.D.   On: 08/14/2016 12:43   Mr Jodene Nam Neck Wo Contrast  Result Date: 08/14/2016 CLINICAL DATA:  History of hypertension.  Near syncope.  Weakness. EXAM: MRA NECK WITHOUT  CONTRAST MRA HEAD WITHOUT CONTRAST TECHNIQUE: Multiplanar and multiecho pulse sequences of the neck were obtained without intravenous contrast. Angiographic images of the neck were obtained using MRA technique without intravenous contast.; Angiographic images of the Circle of Willis were obtained using MRA technique  without intravenous contrast. COMPARISON:  12/11/2013 FINDINGS: MRA NECK FINDINGS Limited detail without contrast. There is antegrade flow in both common carotid arteries. Both  vessels are widely patent to the bifurcation regions. There is 20% stenosis of the proximal ICA on the right. There is 40% stenosis of the proximal ICA on the left. Cervical internal carotid artery's widely patent beyond that. There is some signal loss at the skullbase presumed secondary to susceptibility artifact. Both vertebral arteries are approximately equal in size and show antegrade flow without stenosis. MRA HEAD FINDINGS Both internal carotid arteries are widely patent through the skullbase and siphon regions. The anterior and middle cerebral vessels are patent without proximal stenosis, aneurysm or vascular malformation. Both vertebral arteries are patent to the basilar with the left being dominant. Incidental fenestration of the distal left vertebral artery. No stenosis. Posterior circulation branch vessels show flow. IMPRESSION: Normal intracranial MR angiography of the large and medium size vessels. MR angiography of the neck vessels without contrast does not show any likely significant finding. Detail is limited without contrast. Suspect 20% stenosis at the proximal ICA on the right and 40% stenosis on the left. Noninvasive ultrasound study likely more accurate than this noncontrast neck exam. Electronically Signed   By: Nelson Chimes M.D.   On: 08/14/2016 12:43   US Carotid Bilateral  Result Date: 08/13/2016 CLINICAL DATA:  Cerebral infarction. History of CAD, hypertension, hyperlipidemia, diabetes and smoking. EXAM: BILATERAL CAROTID DUPLEX ULTRASOUND TECHNIQUE: Pearline Cables scale imaging, color Doppler and duplex ultrasound were performed of bilateral carotid and vertebral arteries in the neck. COMPARISON:  None. FINDINGS: Criteria: Quantification of carotid stenosis is based on velocity parameters that correlate the residual  internal carotid diameter with NASCET-based stenosis levels, using the diameter of the distal internal carotid lumen as the denominator for stenosis measurement. The following velocity measurements were obtained: RIGHT ICA:  104/22 cm/sec CCA:  63/01 cm/sec SYSTOLIC ICA/CCA RATIO:  1.2 DIASTOLIC ICA/CCA RATIO:  1.3 ECA:  318 cm/sec LEFT ICA:  196/29 cm/sec CCA:  60/10 cm/sec SYSTOLIC ICA/CCA RATIO:  2.0 DIASTOLIC ICA/CCA RATIO:  1.6 ECA:  226 cm/sec RIGHT CAROTID ARTERY: There is mild tortuosity of the right common carotid artery (image 3). There is a moderate amount of eccentric mixed echogenic partially shadowing plaque involving the origin and proximal aspects of the right internal carotid artery (image 22), not resulting in elevated peak systolic velocities within the interrogated course the right internal carotid artery to suggest a hemodynamically significant stenosis. RIGHT VERTEBRAL ARTERY:  Antegrade flow LEFT CAROTID ARTERY: There is a moderate to large amount of eccentric mixed echogenic partially shadowing plaque within the left carotid bulb (image 47), extending to involve the origin and proximal aspects of the left internal carotid artery (image 54), which results in elevated peak systolic velocities within the proximal aspect of the left internal carotid artery (greatest acquired peak systolic velocity with the proximal ICA measures 196 cm/sec - image 56). LEFT VERTEBRAL ARTERY:  Antegrade Flow IMPRESSION: 1. Moderate to large amount of left-sided atherosclerotic plaque results in elevated peak systolic velocities within the left internal carotid artery compatible with the higher end of the 50-69% luminal narrowing range. Further evaluation with CTA could performed as clinically indicated. 2. Moderate amount of right-sided atherosclerotic plaque, not definitely resulting in a hemodynamically significant stenosis. Electronically Signed   By: Sandi Mariscal M.D.   On: 08/13/2016 15:25    EKG:  Orders  placed or performed during the hospital encounter of 08/12/16  . ED EKG within 10 minutes  . ED EKG within 10 minutes  . EKG 12-Lead  . EKG 12-Lead  . EKG 12-Lead  .  EKG 12-Lead  . EKG 12-Lead  . EKG 12-Lead  . EKG 12-Lead  . EKG 12-Lead    ASSESSMENT AND PLAN:  Active Problems:   Pre-syncope #1. Near syncope of unclear etiology, Unlikely cardiac, as patient had recent evaluation by cardiologist as outpatient, discussed with cardiologist yesterday. Orthostatic vital signs were unremarkable. Carotid ultrasound revealed left ICA stenosis of 50-69%, MRA of carotids and the brain revealed no significant stenosis, although test is without contrast, echocardiogram was performed about a month ago, revealing normal ejection fraction, diastolic dysfunction,  CT angiogram of the heart revealed patent stents, per cardiologist, conservative measures were recommended. Suspending Ranexa, as we are concerned that patient's symptoms could be related to medication.  Patient was seen by physical therapist, recommending skilled nursing facility placement for rehabilitation, and family did not think they would be able to pay out of pocket for rehabilitation, patient will be discharged home tomorrow, if no other problems arise with home health services #2. Hyponatremia, resolved #3. Hyperkalemia, resolved #4 acute on chronic renal insufficiency, improved to baseline #5. Anemia, recheck hemoglobin level in the morning #6, Dysuria, recurrent urination, urinalysis was unremarkable, bladder scan was normal. Patient's symptoms are of unclear etiology, although patient may benefit from urologist evaluation as outpatient. Discussed extensively with family and patient. Added Ditropan as needed, follow clinically   Management plans discussed with the patient, family and they are in agreement.   DRUG ALLERGIES:  Allergies  Allergen Reactions  . Bactrim [Sulfamethoxazole-Trimethoprim]     "Diabetic seizures"  .  Darvon [Propoxyphene] Other (See Comments)    Patient states she was real weak, felt like she was going to pass out.  . Albuterol Palpitations and Other (See Comments)    Irregular heart beat, CAN TOLERATE XOPENEX  . Fentanyl Nausea And Vomiting and Rash  . Latex Rash  . Tetracyclines & Related Rash    CODE STATUS:     Code Status Orders        Start     Ordered   08/12/16 1848  Full code  Continuous     08/12/16 1847    Code Status History    Date Active Date Inactive Code Status Order ID Comments User Context   07/05/2016 12:07 AM 07/06/2016  8:06 PM Full Code [id number]  Harvie Bridge, DO Inpatient   06/27/2015  5:31 PM 06/29/2015  8:52 PM Full Code [id number]  Lytle Butte, MD ED    Advance Directive Documentation     Most Recent Value  Type of Advance Directive  Living will  Pre-existing out of facility DNR order (yellow form or pink MOST form)  -  "MOST" Form in Place?  -      TOTAL TIME TAKING CARE OF THIS PATIENT: 45 minutes.  Discussed with  patient's daughter  Theodoro Grist M.D on 08/14/2016 at 2:23 PM  Between 7am to 6pm - Pager - [phone]  After 6pm go to [email or web address] - password [email or web address] [facility]  Office  [phone]  CC: Primary care physician; Lorelee Market, MD

## 2016-08-15 DIAGNOSIS — E871 Hypo-osmolality and hyponatremia: Secondary | ICD-10-CM | POA: Diagnosis present

## 2016-08-15 DIAGNOSIS — I951 Orthostatic hypotension: Secondary | ICD-10-CM | POA: Diagnosis present

## 2016-08-15 DIAGNOSIS — G4733 Obstructive sleep apnea (adult) (pediatric): Secondary | ICD-10-CM | POA: Diagnosis present

## 2016-08-15 DIAGNOSIS — Z79899 Other long term (current) drug therapy: Secondary | ICD-10-CM | POA: Diagnosis not present

## 2016-08-15 DIAGNOSIS — Z96642 Presence of left artificial hip joint: Secondary | ICD-10-CM | POA: Diagnosis present

## 2016-08-15 DIAGNOSIS — J449 Chronic obstructive pulmonary disease, unspecified: Secondary | ICD-10-CM | POA: Diagnosis present

## 2016-08-15 DIAGNOSIS — M1990 Unspecified osteoarthritis, unspecified site: Secondary | ICD-10-CM | POA: Diagnosis present

## 2016-08-15 DIAGNOSIS — Z85118 Personal history of other malignant neoplasm of bronchus and lung: Secondary | ICD-10-CM | POA: Diagnosis not present

## 2016-08-15 DIAGNOSIS — E785 Hyperlipidemia, unspecified: Secondary | ICD-10-CM | POA: Diagnosis present

## 2016-08-15 DIAGNOSIS — I509 Heart failure, unspecified: Secondary | ICD-10-CM | POA: Diagnosis present

## 2016-08-15 DIAGNOSIS — N189 Chronic kidney disease, unspecified: Secondary | ICD-10-CM | POA: Diagnosis present

## 2016-08-15 DIAGNOSIS — I2510 Atherosclerotic heart disease of native coronary artery without angina pectoris: Secondary | ICD-10-CM | POA: Diagnosis present

## 2016-08-15 DIAGNOSIS — I130 Hypertensive heart and chronic kidney disease with heart failure and stage 1 through stage 4 chronic kidney disease, or unspecified chronic kidney disease: Secondary | ICD-10-CM | POA: Diagnosis present

## 2016-08-15 DIAGNOSIS — G894 Chronic pain syndrome: Secondary | ICD-10-CM | POA: Diagnosis present

## 2016-08-15 DIAGNOSIS — D649 Anemia, unspecified: Secondary | ICD-10-CM | POA: Diagnosis present

## 2016-08-15 DIAGNOSIS — E1140 Type 2 diabetes mellitus with diabetic neuropathy, unspecified: Secondary | ICD-10-CM | POA: Diagnosis present

## 2016-08-15 DIAGNOSIS — E875 Hyperkalemia: Secondary | ICD-10-CM | POA: Diagnosis present

## 2016-08-15 DIAGNOSIS — I4891 Unspecified atrial fibrillation: Secondary | ICD-10-CM | POA: Diagnosis present

## 2016-08-15 DIAGNOSIS — E0590 Thyrotoxicosis, unspecified without thyrotoxic crisis or storm: Secondary | ICD-10-CM | POA: Diagnosis present

## 2016-08-15 DIAGNOSIS — Z955 Presence of coronary angioplasty implant and graft: Secondary | ICD-10-CM | POA: Diagnosis not present

## 2016-08-15 DIAGNOSIS — Z8673 Personal history of transient ischemic attack (TIA), and cerebral infarction without residual deficits: Secondary | ICD-10-CM | POA: Diagnosis not present

## 2016-08-15 DIAGNOSIS — I252 Old myocardial infarction: Secondary | ICD-10-CM | POA: Diagnosis not present

## 2016-08-15 DIAGNOSIS — R55 Syncope and collapse: Secondary | ICD-10-CM | POA: Diagnosis present

## 2016-08-15 DIAGNOSIS — E1122 Type 2 diabetes mellitus with diabetic chronic kidney disease: Secondary | ICD-10-CM | POA: Diagnosis present

## 2016-08-15 LAB — URINE CULTURE: CULTURE: NO GROWTH

## 2016-08-15 MED ORDER — MAGNESIUM HYDROXIDE 400 MG/5ML PO SUSP
30.0000 mL | Freq: Every evening | ORAL | Status: DC | PRN
  Administered 2016-08-15: 30 mL via ORAL
  Filled 2016-08-15: qty 30

## 2016-08-15 MED ORDER — SODIUM CHLORIDE 0.9 % IV BOLUS (SEPSIS)
1000.0000 mL | INTRAVENOUS | Status: DC | PRN
  Administered 2016-08-15: 1000 mL via INTRAVENOUS
  Filled 2016-08-15: qty 1000

## 2016-08-15 MED ORDER — SODIUM CHLORIDE 0.9 % IV SOLN
INTRAVENOUS | Status: DC
  Administered 2016-08-15 – 2016-08-16 (×2): via INTRAVENOUS

## 2016-08-15 NOTE — Progress Notes (Signed)
SUBJECTIVE: Patient is feeling much better   Vitals:   08/14/16 0344 08/14/16 1247 08/14/16 2044 08/15/16 0409  BP: (!) 128/58 (!) 121/58 (!) 140/53 (!) 129/53  Pulse: 88 81 90 73  Resp: '18 18 16 16  '$ Temp: 98.3 F (36.8 C) 98.6 F (37 C) 98.7 F (37.1 C) 98.3 F (36.8 C)  TempSrc: Oral Oral Oral   SpO2: 99% 100% 98% 100%  Weight:      Height:        Intake/Output Summary (Last 24 hours) at 08/15/16 0850 Last data filed at 08/15/16 0836  Gross per 24 hour  Intake              240 ml  Output             2000 ml  Net            -1760 ml    LABS: Basic Metabolic Panel:  Recent Labs  08/12/16 1530 08/13/16 0302 08/14/16 0246 08/14/16 0826  NA 134* 140  --   --   K 5.8* 5.1  --  4.6  CL 103 107  --   --   CO2 25 28  --   --   GLUCOSE 131* 97  --   --   BUN 26* 20  --   --   CREATININE 1.59* 1.18* 1.09*  --   CALCIUM 9.0 8.8*  --   --    Liver Function Tests: No results for input(s): AST, ALT, ALKPHOS, BILITOT, PROT, ALBUMIN in the last 72 hours. No results for input(s): LIPASE, AMYLASE in the last 72 hours. CBC:  Recent Labs  08/12/16 1530 08/14/16 0246  WBC 4.2  --   HGB 9.9* 9.4*  HCT 29.5*  --   MCV 89.2  --   PLT 230  --    Cardiac Enzymes:  Recent Labs  08/14/16 0246 08/14/16 0826 08/14/16 1450  TROPONINI <0.03 <0.03 <0.03   BNP: Invalid input(s): POCBNP D-Dimer: No results for input(s): DDIMER in the last 72 hours. Hemoglobin A1C: No results for input(s): HGBA1C in the last 72 hours. Fasting Lipid Panel: No results for input(s): CHOL, HDL, LDLCALC, TRIG, CHOLHDL, LDLDIRECT in the last 72 hours. Thyroid Function Tests: No results for input(s): TSH, T4TOTAL, T3FREE, THYROIDAB in the last 72 hours.  Invalid input(s): FREET3 Anemia Panel: No results for input(s): VITAMINB12, FOLATE, FERRITIN, TIBC, IRON, RETICCTPCT in the last 72 hours.   PHYSICAL EXAM General: Well developed, well nourished, in no acute distress HEENT:  Normocephalic  and atramatic Neck:  No JVD.  Lungs: Clear bilaterally to auscultation and percussion. Heart: HRRR . Normal S1 and S2 without gallops or murmurs.  Abdomen: Bowel sounds are positive, abdomen soft and non-tender  Msk:  Back normal, normal gait. Normal strength and tone for age. Extremities: No clubbing, cyanosis or edema.   Neuro: Alert and oriented X 3. Psych:  Good affect, responds appropriately  TELEMETRY:Sinus rhythm  ASSESSMENT AND PLAN: Junctional rhythm secondary to renal insufficiency leading to hyperkalemia. Agree with discharging the patient since patient is back in sinus rhythm with follow-up in the office next Tuesday next week at 10 AM.  Active Problems:   Pre-syncope    Debbie Koral A, MD, [facility] 08/15/2016 8:50 AM

## 2016-08-15 NOTE — Progress Notes (Addendum)
[facility] NAME: [name]    MR#:  [id number]  DATE OF BIRTH:  September 25, 1925  SUBJECTIVE:  CHIEF COMPLAINT:   Chief Complaint  Patient presents with  . Weakness  . Chest Pain   The patient is 83 year old Caucasian female with a history significant for history of coronary artery disease, status post stent placements, hypertension, COPD, lung cancer, diabetes, hyperlipidemia, obstructive sleep apnea, chronic pain syndrome, who presents to the hospitalist presyncope on exertion . She has very similar episode in the hospital while she was walking. She was seen by cardiologist, recommended no further intervention was felt to be hyperkalemia related. Junctional rhythm has resolved, likely hyperkalemia related per cardiology. The patient feels good today, complains of intermittent episodes of feeling like she has been drained of energy, even not on exertion. She blames Ranexa. It was stopped. She also complains of intermittent urination, urgency. Bladder scan revealed only 6 cc. Some dysuria Patient still has intermittent episodes of feeling like she was drained after energy. Her orthostatic vital signs were repeated and they were found to be markedly abnormal with systolic blood pressure dropping down from 160-100. IV fluid boluses initiated and IV fluid maintenance. Patient is being made to inpatient today. The patient was evaluated by physical therapist and recommended not to be safe to be discharged home, yet.   Review of Systems  Constitutional: Positive for malaise/fatigue. Negative for chills, fever and weight loss.  HENT: Negative for congestion.   Eyes: Negative for blurred vision and double vision.  Respiratory: Negative for cough, sputum production, shortness of breath and wheezing.   Cardiovascular: Negative for chest pain, palpitations, orthopnea, leg swelling and PND.  Gastrointestinal: Negative for abdominal pain, blood  in stool, constipation, diarrhea, nausea and vomiting.  Genitourinary: Positive for dysuria, frequency and urgency. Negative for hematuria.  Musculoskeletal: Negative for falls.  Neurological: Positive for sensory change. Negative for dizziness, tremors, focal weakness and headaches.  Endo/Heme/Allergies: Does not bruise/bleed easily.  Psychiatric/Behavioral: Negative for depression. The patient does not have insomnia.     VITAL SIGNS: Blood pressure (!) 129/53, pulse 73, temperature 98.3 F (36.8 C), resp. rate 16, height '5\' 5"'$  (1.651 m), weight 88 kg (194 lb 1.6 oz), SpO2 100 %.  PHYSICAL EXAMINATION:   GENERAL:  83 y.o.-year-old patient lying in the bed with no acute distress.  EYES: Pupils equal, round, reactive to light and accommodation. No scleral icterus. Extraocular muscles intact.  HEENT: Head atraumatic, normocephalic. Oropharynx and nasopharynx clear.  NECK:  Supple, no jugular venous distention. No thyroid enlargement, no tenderness.  LUNGS: Normal breath sounds bilaterally, no wheezing, rales,rhonchi or crepitation. No use of accessory muscles of respiration.  CARDIOVASCULAR: S1, S2 normal. No murmurs, rubs, or gallops.  ABDOMEN: Soft, nontender, nondistended. Bowel sounds present. No organomegaly or mass.  EXTREMITIES: No pedal edema, cyanosis, or clubbing.  NEUROLOGIC: Cranial nerves II through XII are intact. Muscle strength 5/5 in all extremities. Sensation intact. Gait not checked.  PSYCHIATRIC: The patient is alert and oriented x 3.  SKIN: No obvious rash, lesion, or ulcer.   ORDERS/RESULTS REVIEWED:   CBC  Recent Labs Lab 08/12/16 1530 08/14/16 0246  WBC 4.2  --   HGB 9.9* 9.4*  HCT 29.5*  --   PLT 230  --   MCV 89.2  --   MCH 30.0  --   MCHC 33.7  --   RDW 13.0  --    ------------------------------------------------------------------------------------------------------------------  Chemistries  Recent Labs Lab 08/12/16 1530 08/13/16 0302  08/14/16 0246 08/14/16 0826  NA 134* 140  --   --   K 5.8* 5.1  --  4.6  CL 103 107  --   --   CO2 25 28  --   --   GLUCOSE 131* 97  --   --   BUN 26* 20  --   --   CREATININE 1.59* 1.18* 1.09*  --   CALCIUM 9.0 8.8*  --   --    ------------------------------------------------------------------------------------------------------------------ estimated creatinine clearance is 39.1 mL/min (A) (by C-G formula based on SCr of 1.09 mg/dL (H)). ------------------------------------------------------------------------------------------------------------------ No results for input(s): TSH, T4TOTAL, T3FREE, THYROIDAB in the last 72 hours.  Invalid input(s): FREET3  Cardiac Enzymes  Recent Labs Lab 08/14/16 0246 08/14/16 0826 08/14/16 1450  TROPONINI <0.03 <0.03 <0.03   ------------------------------------------------------------------------------------------------------------------ Invalid input(s): POCBNP ---------------------------------------------------------------------------------------------------------------  RADIOLOGY: Mr Virgel Paling Wo Contrast  Result Date: 08/14/2016 CLINICAL DATA:  History of hypertension.  Near syncope.  Weakness. EXAM: MRA NECK WITHOUT  CONTRAST MRA HEAD WITHOUT CONTRAST TECHNIQUE: Multiplanar and multiecho pulse sequences of the neck were obtained without intravenous contrast. Angiographic images of the neck were obtained using MRA technique without intravenous contast.; Angiographic images of the Circle of Willis were obtained using MRA technique without intravenous contrast. COMPARISON:  12/11/2013 FINDINGS: MRA NECK FINDINGS Limited detail without contrast. There is antegrade flow in both common carotid arteries. Both vessels are widely patent to the bifurcation regions. There is 20% stenosis of the proximal ICA on the right. There is 40% stenosis of the proximal ICA on the left. Cervical internal carotid artery's widely patent beyond that. There is some  signal loss at the skullbase presumed secondary to susceptibility artifact. Both vertebral arteries are approximately equal in size and show antegrade flow without stenosis. MRA HEAD FINDINGS Both internal carotid arteries are widely patent through the skullbase and siphon regions. The anterior and middle cerebral vessels are patent without proximal stenosis, aneurysm or vascular malformation. Both vertebral arteries are patent to the basilar with the left being dominant. Incidental fenestration of the distal left vertebral artery. No stenosis. Posterior circulation branch vessels show flow. IMPRESSION: Normal intracranial MR angiography of the large and medium size vessels. MR angiography of the neck vessels without contrast does not show any likely significant finding. Detail is limited without contrast. Suspect 20% stenosis at the proximal ICA on the right and 40% stenosis on the left. Noninvasive ultrasound study likely more accurate than this noncontrast neck exam. Electronically Signed   By: Nelson Chimes M.D.   On: 08/14/2016 12:43   Mr Jodene Nam Neck Wo Contrast  Result Date: 08/14/2016 CLINICAL DATA:  History of hypertension.  Near syncope.  Weakness. EXAM: MRA NECK WITHOUT  CONTRAST MRA HEAD WITHOUT CONTRAST TECHNIQUE: Multiplanar and multiecho pulse sequences of the neck were obtained without intravenous contrast. Angiographic images of the neck were obtained using MRA technique without intravenous contast.; Angiographic images of the Circle of Willis were obtained using MRA technique without intravenous contrast. COMPARISON:  12/11/2013 FINDINGS: MRA NECK FINDINGS Limited detail without contrast. There is antegrade flow in both common carotid arteries. Both vessels are widely patent to the bifurcation regions. There is 20% stenosis of the proximal ICA on the right. There is 40% stenosis of the proximal ICA on the left. Cervical internal carotid artery's widely patent beyond that. There is some signal loss  at the skullbase presumed secondary to susceptibility artifact. Both vertebral arteries are approximately equal in size  and show antegrade flow without stenosis. MRA HEAD FINDINGS Both internal carotid arteries are widely patent through the skullbase and siphon regions. The anterior and middle cerebral vessels are patent without proximal stenosis, aneurysm or vascular malformation. Both vertebral arteries are patent to the basilar with the left being dominant. Incidental fenestration of the distal left vertebral artery. No stenosis. Posterior circulation branch vessels show flow. IMPRESSION: Normal intracranial MR angiography of the large and medium size vessels. MR angiography of the neck vessels without contrast does not show any likely significant finding. Detail is limited without contrast. Suspect 20% stenosis at the proximal ICA on the right and 40% stenosis on the left. Noninvasive ultrasound study likely more accurate than this noncontrast neck exam. Electronically Signed   By: Nelson Chimes M.D.   On: 08/14/2016 12:43    EKG:  Orders placed or performed during the hospital encounter of 08/12/16  . ED EKG within 10 minutes  . ED EKG within 10 minutes  . EKG 12-Lead  . EKG 12-Lead  . EKG 12-Lead  . EKG 12-Lead  . EKG 12-Lead  . EKG 12-Lead  . EKG 12-Lead  . EKG 12-Lead    ASSESSMENT AND PLAN:  Active Problems:   Pre-syncope   Orthostatic hypotension #1. Near syncope due to orthostatic hypotension, initiate patient on IV fluids. After IV fluid boluses completed, following orthostatic vital signs every shift. Reassess tomorrow. The patient had recent evaluation by cardiologist as outpatient. Initial orthostatic vital signs were unremarkable few days ago. Carotid ultrasound revealed left ICA stenosis of 50-69%, MRA of carotids and the brain revealed no significant stenosis, although test was without contrast, echocardiogram was performed about a month ago, revealing normal ejection fraction,  diastolic dysfunction,  CT angiogram of the heart revealed patent stents, per cardiologist, conservative measures were recommended. Suspended Ranexa, as we were concerned that patient's symptoms could be related to the medication.  Patient was seen by physical therapist, recommending skilled nursing facility placement for rehabilitation,  reassess tomorrow  #2. Hyponatremia, resolved #3. Hyperkalemia, resolved #4 acute on chronic renal insufficiency, improved to baseline #5. Anemia, recheck hemoglobin level in the morning, stable so far, however, may decline with rehydration #6, Dysuria, urinalysis was unremarkable, post vodal bladder scan was negative. Patient may benefit from urologist evaluation as outpatient. Discussed with family and patient. Added Ditropan as needed, follow clinically #7 orthostatic hypotension, IV fluid bolus and IV fluids, checking orthostatic vital signs every shift.  Management plans discussed with the patient, family and they are in agreement.   DRUG ALLERGIES:  Allergies  Allergen Reactions  . Bactrim [Sulfamethoxazole-Trimethoprim]     "Diabetic seizures"  . Darvon [Propoxyphene] Other (See Comments)    Patient states she was real weak, felt like she was going to pass out.  . Albuterol Palpitations and Other (See Comments)    Irregular heart beat, CAN TOLERATE XOPENEX  . Fentanyl Nausea And Vomiting and Rash  . Latex Rash  . Tetracyclines & Related Rash    CODE STATUS:     Code Status Orders        Start     Ordered   08/12/16 1848  Full code  Continuous     08/12/16 1847    Code Status History    Date Active Date Inactive Code Status Order ID Comments User Context   07/05/2016 12:07 AM 07/06/2016  8:06 PM Full Code [id number]  Harvie Bridge, DO Inpatient   06/27/2015  5:31 PM 06/29/2015  8:52 PM Full Code  [id number]  Lytle Butte, MD ED    Advance Directive Documentation     Most Recent Value  Type of Advance Directive  Living will  Pre-existing  out of facility DNR order (yellow form or pink MOST form)  -  "MOST" Form in Place?  -      TOTAL TIME TAKING CARE OF THIS PATIENT: 35 minutes.  Discussed with  3 patient's daughters  Theodoro Grist M.D on 08/15/2016 at 3:30 PM  Between 7am to 6pm - Pager - [phone]  After 6pm go to [email or web address] - password [email or web address] [facility]  Office  [phone]  CC: Primary care physician; Lorelee Market, MD

## 2016-08-15 NOTE — Plan of Care (Signed)
Problem: Pain Managment: Goal: General experience of comfort will improve Outcome: Progressing PRN pain medication given when requested.

## 2016-08-15 NOTE — Progress Notes (Signed)
Pt complains of no BM since admission. Prune juice given early in shift. MD notified. Order for MOM will be entered. I will continue to assess.

## 2016-08-15 NOTE — Progress Notes (Signed)
Physical Therapy Treatment Patient Details Name: [name] MRN: [id number] DOB: 04-08-1926 Today's Date: 08/15/2016    History of Present Illness 83 yo female with onset of presyncope and previous lung scarring from R lung CA was admitted for cardiac arrhythmia, potentially needs a pacemaker.  Has elevated K+ which was corrected.  PMHx:  CAD, stroke, HTN, CHF, a-fib, DM    PT Comments    Progressive increase in gait distance, however remains limited by symptomatic orthostasis (with 40-50 point SBP drop from supine to standing).  Patient able to verbalize symptoms and initiate activity restrictions independently; unable to demonstrate ability to indep negotiate home environment/ADLs at this time.   RN informed/aware.     Follow Up Recommendations  SNF     Equipment Recommendations       Recommendations for Other Services       Precautions / Restrictions Precautions Precautions: Fall Restrictions Weight Bearing Restrictions: No    Mobility  Bed Mobility Overal bed mobility: Modified Independent                Transfers Overall transfer level: Needs assistance Equipment used: Rolling walker (2 wheeled) Transfers: Sit to/from Stand Sit to Stand: Min guard;Supervision         General transfer comment: increased sway in A/P plane, mild abdominal flaring  Ambulation/Gait Ambulation/Gait assistance: Min assist Ambulation Distance (Feet): 60 Feet Assistive device: Rolling walker (2 wheeled)     Gait velocity interpretation: <1.8 ft/sec, indicative of risk for recurrent falls General Gait Details: reciprocal stepping pattern with slow, cautious gait speed; intermittent knee buckling with fatigue, distance limited by reports of 'weaknes' and 'dizziness'   Stairs            Wheelchair Mobility    Modified Rankin (Stroke Patients Only)       Balance Overall balance assessment: Needs assistance Sitting-balance support: No upper extremity  supported;Feet supported Sitting balance-Leahy Scale: Good     Standing balance support: Bilateral upper extremity supported Standing balance-Leahy Scale: Fair                      Cognition Arousal/Alertness: Awake/alert Behavior During Therapy: WFL for tasks assessed/performed Overall Cognitive Status: Within Functional Limits for tasks assessed                      Exercises Other Exercises Other Exercises: Orthostatic assessment as noted above-positive orthostasis, with 40-50 point drop noted with transition to upright.  Educated on role of brief pauses with transition to upright to minimize symptoms prior to mobility away from seating surface.    General Comments        Pertinent Vitals/Pain Pain Assessment: No/denies pain    Home Living                      Prior Function            PT Goals (current goals can now be found in the care plan section) Acute Rehab PT Goals Patient Stated Goal: to walk and feel better PT Goal Formulation: With patient/family Time For Goal Achievement: 08/27/16 Potential to Achieve Goals: Good Progress towards PT goals: Progressing toward goals    Frequency    Min 2X/week      PT Plan Current plan remains appropriate    Co-evaluation             End of Session Equipment Utilized During Treatment: Gait belt Activity Tolerance: Treatment  limited secondary to medical complications (Comment) (symptomatic orthostasis) Patient left: with call bell/phone within reach;with family/visitor present;in chair;with chair alarm set Nurse Communication: Mobility status PT Visit Diagnosis: Other abnormalities of gait and mobility (R26.89)     Time: 7703-4035 PT Time Calculation (min) (ACUTE ONLY): 34 min  Charges:  $Gait Training: 8-22 mins $Therapeutic Exercise: 8-22 mins                    G Codes:       Allyn Bertoni H. Owens Shark, PT, DPT, NCS 08/15/16, 1:47 PM [phone]

## 2016-08-15 NOTE — Care Management (Addendum)
Patient presents from home with weakness and presyncope event.  It is thought it may be from a junctional heart rhythm. Possibility of pacemaker was discussed by cardiology.  Today patient is significantly orthostatic with a 40 - 50 point drop in systolic blood pressure from supine to sitting.  Patient is currently open to [facility] for nursing.  At discharge will require physical therapy and aide added to services.  Notified [facility] of observation.  Provided patient with [facility] life alert brochure.  had it in the past but moved to a house without a landline.  Brochure makes reference to cellular units.  Patient much prefers to discharge home.  Physical therapy has recommended SNF, which is not covered under medicare at present time.  Explained medicare criteria for snf when has been placed in observation  and if met admission status, the 3 day stay would have to be medically necessary.  Patient is current with her pcp and has access to a walker.  No issues accessing medical  care or paying for medications

## 2016-08-16 DIAGNOSIS — E875 Hyperkalemia: Secondary | ICD-10-CM

## 2016-08-16 DIAGNOSIS — E871 Hypo-osmolality and hyponatremia: Secondary | ICD-10-CM

## 2016-08-16 DIAGNOSIS — D510 Vitamin B12 deficiency anemia due to intrinsic factor deficiency: Secondary | ICD-10-CM

## 2016-08-16 DIAGNOSIS — N289 Disorder of kidney and ureter, unspecified: Secondary | ICD-10-CM

## 2016-08-16 LAB — BASIC METABOLIC PANEL
ANION GAP: 6 (ref 5–15)
BUN: 14 mg/dL (ref 6–20)
CALCIUM: 8.4 mg/dL — AB (ref 8.9–10.3)
CO2: 28 mmol/L (ref 22–32)
Chloride: 105 mmol/L (ref 101–111)
Creatinine, Ser: 0.91 mg/dL (ref 0.44–1.00)
GFR calc Af Amer: >60 mL/min (ref >60)
GFR, EST NON AFRICAN AMERICAN: 55 mL/min — AB (ref >60)
GLUCOSE: 84 mg/dL (ref 65–99)
POTASSIUM: 3.7 mmol/L (ref 3.5–5.1)
SODIUM: 139 mmol/L (ref 135–145)

## 2016-08-16 LAB — HEMOGLOBIN: HEMOGLOBIN: 8.6 g/dL — AB (ref 12.0–16.0)

## 2016-08-16 MED ORDER — AMLODIPINE BESYLATE 10 MG PO TABS: 10.0000 mg | ORAL_TABLET | Freq: Every day | ORAL | 3 refills | Status: DC

## 2016-08-16 MED ORDER — CYANOCOBALAMIN 1000 MCG/ML IJ SOLN
1000.0000 ug | Freq: Once | INTRAMUSCULAR | Status: AC
  Administered 2016-08-16: 1000 ug via INTRAMUSCULAR
  Filled 2016-08-16: qty 1

## 2016-08-16 NOTE — Clinical Social Work Note (Signed)
CSW received referral for SNF.  Case discussed with case manager and plan is to discharge home with home health.  CSW to sign off please re-consult if social work needs arise.  [name] R. Glyn Gerads, MSW, LCSWA [phone]  

## 2016-08-16 NOTE — Progress Notes (Signed)
SUBJECTIVE: Pt reports some weakness and orthostatic hypotension when walking/transitioning. Chest pain has resolved.   Vitals:   08/14/16 2044 08/15/16 0409 08/15/16 2044 08/16/16 0428  BP: (!) 140/53 (!) 129/53 (!) 129/54 134/64  Pulse: 90 73 90 80  Resp: '16 16 16 16  '$ Temp: 98.7 F (37.1 C) 98.3 F (36.8 C) 98.1 F (36.7 C) 98 F (36.7 C)  TempSrc: Oral  Oral Oral  SpO2: 98% 100% 96% 95%  Weight:      Height:        Intake/Output Summary (Last 24 hours) at 08/16/16 0926 Last data filed at 08/16/16 7106  Gross per 24 hour  Intake             1510 ml  Output                0 ml  Net             1510 ml    LABS: Basic Metabolic Panel:  Recent Labs  08/14/16 0246 08/14/16 0826 08/16/16 0658  NA  --   --  139  K  --  4.6 3.7  CL  --   --  105  CO2  --   --  28  GLUCOSE  --   --  84  BUN  --   --  14  CREATININE 1.09*  --  0.91  CALCIUM  --   --  8.4*   Liver Function Tests: No results for input(s): AST, ALT, ALKPHOS, BILITOT, PROT, ALBUMIN in the last 72 hours. No results for input(s): LIPASE, AMYLASE in the last 72 hours. CBC:  Recent Labs  08/14/16 0246 08/16/16 0658  HGB 9.4* 8.6*   Cardiac Enzymes:  Recent Labs  08/14/16 0246 08/14/16 0826 08/14/16 1450  TROPONINI <0.03 <0.03 <0.03   BNP: Invalid input(s): POCBNP D-Dimer: No results for input(s): DDIMER in the last 72 hours. Hemoglobin A1C: No results for input(s): HGBA1C in the last 72 hours. Fasting Lipid Panel: No results for input(s): CHOL, HDL, LDLCALC, TRIG, CHOLHDL, LDLDIRECT in the last 72 hours. Thyroid Function Tests: No results for input(s): TSH, T4TOTAL, T3FREE, THYROIDAB in the last 72 hours.  Invalid input(s): FREET3 Anemia Panel: No results for input(s): VITAMINB12, FOLATE, FERRITIN, TIBC, IRON, RETICCTPCT in the last 72 hours.   PHYSICAL EXAM General: Well developed, well nourished, in no acute distress HEENT:  Normocephalic and atramatic Neck:  No JVD.  Lungs: Clear  bilaterally to auscultation and percussion. Heart: HRRR . Normal S1 and S2 without gallops or murmurs.  Abdomen: Bowel sounds are positive, abdomen soft with left lower quadrant tenderness which is chronic for her.   Extremities: No clubbing, cyanosis or edema.   Neuro: Alert and oriented X 3. Psych:  Good affect, responds appropriately  TELEMETRY: First degree AV block 80bpm.   ASSESSMENT AND PLAN: Pt reports she would like to be discharged. Reports she is working with case managment to arrange transfer to rehab upon discharge. Pt is cleared for discharge from cardiology perspective with strong recommendation for rehab admission or, if unable to arrange, home heart rate monitoring with home health visits 3days/week.   Active Problems:   Pre-syncope   Orthostatic hypotension    Jake Bathe, NP-C 08/16/2016 9:26 AM

## 2016-08-16 NOTE — Care Management (Signed)
Patient for discharge home today.  Notified [facility] of need for SN PT and Aide.  Notified attending

## 2016-08-16 NOTE — Discharge Summary (Signed)
[facility] NAME: [name]    MR#:  [id number]  DATE OF BIRTH:  [birth year]  DATE OF ADMISSION:  08/12/2016 ADMITTING PHYSICIAN: Henreitta Leber, MD  DATE OF DISCHARGE: 08/16/2016  4:09 PM  PRIMARY CARE PHYSICIAN: Lorelee Market, MD     ADMISSION DIAGNOSIS:  Near syncope [R55] Cardiac arrhythmia, unspecified cardiac arrhythmia type [I49.9]  DISCHARGE DIAGNOSIS:  Principal Problem:   Pre-syncope Active Problems:   Orthostatic hypotension   Hyponatremia   Hyperkalemia   Renal insufficiency   Anemia   SECONDARY DIAGNOSIS:   Past Medical History:  Diagnosis Date  . Arrhythmia   . Arthritis   . Asthma   . Atrial fibrillation ([place])   . Cancer ([place])    lung cancer  . COPD (chronic obstructive pulmonary disease) ([place])   . Coronary artery disease   . Depression   . Diabetes mellitus without complication ([place])   . Heart attack   . Heart disease   . Heart failure ([place])   . Heartburn   . HLD (hyperlipidemia)   . Hypertension   . Hyperthyroidism   . Kidney failure   . Sleep apnea   . Stroke ([place])     .pro HOSPITAL COURSE:   The patient is 83 year old Caucasian female with a history significant for history of coronary artery disease, status post stent placements, hypertension, COPD, lung cancer, diabetes, hyperlipidemia, obstructive sleep apnea, chronic pain syndrome, who presents to the hospitalist presyncope on exertion . She has very similar episode in the hospital while she was walking. She was seen by cardiologist, recommended no further intervention,  was felt to be hyperkalemia related. Junctional rhythm has resolved, likely hyperkalemia related per cardiology. The patient continued to have intermittent episodes of feeling like she has been drained of energy, not even on exertion. While in the hospital. She blamed Ranexa,  It was stopped. She'll orthostatic vital signs were unremarkable. She also  complained of intermittent urination, urgency. Bladder scan revealed only 6 cc. Urinalysis was unremarkable, urine culture no growth. Repeated orthostatic vital signs were found to be markedly abnormal with systolic blood pressure dropping down from 160-100. IV fluid was given and patient's orthostatic blood pressure changes resolved. Initially, the patient  was evaluated by physical therapist and recommended to be discharged to skilled nursing facility for rehabilitation, however, after she was rehydrated, she felt comfortable to go home with home health services. She is being discharged home today.  Discussion by problem:  #1. Near syncope due to orthostatic hypotension, improved. The patient had recent evaluation by cardiologist as outpatient who recommended no further evaluation . Initial orthostatic vital signs were unremarkable.  Carotid ultrasound revealed left ICA stenosis of 50-69%, MRA of carotids and the brain revealed no significant stenosis, although test was without contrast, echocardiogram was performed about a month ago, revealing normal ejection fraction, diastolic dysfunction,  CT angiogram of the heart revealed patent stents, per cardiologist, conservative measures were recommended. Suspended Ranexa, as there was a  concern that patient's symptoms could be partially related to the medication.  Patient was seen by physical therapist, discharging patient home with home health services #2. Hyponatremia, resolved #3. Hyperkalemia, resolved, likely due to ACE inhibitor, which is stopped and is to follow-up with primary cardiologist to make decisions about the reinitiation of ACE inhibitor as outpatient #4 acute on chronic renal insufficiency, improved to baseline #5. Anemia, hemoglobin level 8.6 on the day of discharge, patient's family  told me that patient has history of pernicious anemia, vitamin B12 shot is going to be given prior to discharge from the hospital. The patient was recommended to  follow-up with Dr. Grayland Ormond as outpatient for further recommendations #6, Dysuria, urinalysis was unremarkable, post vodal bladder scan was 60 cc, urine culture was negative. Patient may benefit from urologist evaluation as outpatient. Discussed with family and patient.  #7 orthostatic hypotension, IV fluid bolus and IV fluids, checking orthostatic vital signs every shift, resolved  DISCHARGE CONDITIONS:   Stable  CONSULTS OBTAINED:  Treatment Team:  Dionisio David, MD  DRUG ALLERGIES:   Allergies  Allergen Reactions  . Bactrim [Sulfamethoxazole-Trimethoprim]     "Diabetic seizures"  . Darvon [Propoxyphene] Other (See Comments)    Patient states she was real weak, felt like she was going to pass out.  . Albuterol Palpitations and Other (See Comments)    Irregular heart beat, CAN TOLERATE XOPENEX  . Fentanyl Nausea And Vomiting and Rash  . Latex Rash  . Tetracyclines & Related Rash    DISCHARGE MEDICATIONS:   Discharge Medication List as of 08/16/2016  3:11 PM    START taking these medications   Details  amLODipine (NORVASC) 10 MG tablet Take 1 tablet (10 mg total) by mouth daily., Starting Wed 08/17/2016, Normal      CONTINUE these medications which have NOT CHANGED   Details  ALPRAZolam (XANAX) 0.25 MG tablet Take 1 tablet (0.25 mg total) by mouth 2 (two) times daily as needed for anxiety., Starting Wed 07/06/2016, Print    aspirin EC 81 MG EC tablet Take 1 tablet (81 mg total) by mouth daily., Starting Thu 07/07/2016, Print    Choline Fenofibrate (FENOFIBRIC ACID) 135 MG CPDR Take 135 mg by mouth daily. Reported on 11/24/2015, Starting 06/22/2015, Until Discontinued, Historical Med    clopidogrel (PLAVIX) 75 MG tablet Take 1 tablet (75 mg total) by mouth daily., Starting Thu 07/07/2016, Print    estradiol (ESTRACE VAGINAL) 0.1 MG/GM vaginal cream Apply 0.'5mg'$  (pea-sized amount)  just inside the vaginal introitus with a finger-tip every night for two weeks and then Monday, Wednesday  and Friday nights., Print    gabapentin (NEURONTIN) 100 MG capsule Take 100 mg by mouth 2 (two) times daily., Starting 05/26/2015, Until Discontinued, Historical Med    glipiZIDE (GLUCOTROL XL) 10 MG 24 hr tablet Take 10 mg by mouth daily with breakfast., Historical Med    iron polysaccharides (NIFEREX) 150 MG capsule Take 1 capsule (150 mg total) by mouth daily., Starting Thu 07/07/2016, Print    isosorbide mononitrate (IMDUR) 60 MG 24 hr tablet Take 60 mg by mouth 2 (two) times daily., Starting 05/26/2015, Until Discontinued, Historical Med    levalbuterol (XOPENEX HFA) 45 MCG/ACT inhaler Inhale 1 puff into the lungs 4 (four) times daily as needed. For wheezing/shortness of breath., Until Discontinued, Historical Med    levalbuterol (XOPENEX) 1.25 MG/3ML nebulizer solution Take 1.25 mg by nebulization every 4 (four) hours as needed for wheezing or shortness of breath., Until Discontinued, Historical Med    levothyroxine (SYNTHROID, LEVOTHROID) 125 MCG tablet Take 125 mcg by mouth daily before breakfast., Starting 06/09/2015, Until Discontinued, Historical Med    meclizine (ANTIVERT) 25 MG tablet Take 25 mg by mouth 2 (two) times daily., Until Discontinued, Historical Med    meloxicam (MOBIC) 7.5 MG tablet Take 7.5 mg by mouth every other day., Starting 05/18/2015, Until Discontinued, Historical Med    montelukast (SINGULAIR) 10 MG tablet Take 10 mg by mouth  daily., Starting 06/17/2015, Until Discontinued, Historical Med    nitroGLYCERIN (NITROSTAT) 0.4 MG SL tablet Place 1 tablet (0.4 mg total) under the tongue every 5 (five) minutes as needed for chest pain., Starting Wed 07/06/2016, Print    pantoprazole (PROTONIX) 40 MG tablet Take 1 tablet (40 mg total) by mouth daily., Starting Thu 07/07/2016, Print    ranolazine (RANEXA) 500 MG 12 hr tablet Take 1 tablet (500 mg total) by mouth 2 (two) times daily., Starting Wed 07/06/2016, Print    rosuvastatin (CRESTOR) 20 MG tablet Take 20 mg by mouth  daily. , Starting 05/18/2015, Until Discontinued, Historical Med    SPIRIVA HANDIHALER 18 MCG inhalation capsule Place 18 mcg into inhaler and inhale daily., Starting 05/26/2015, Until Discontinued, Historical Med    traMADol-acetaminophen (ULTRACET) 37.5-325 MG tablet Take 1 tablet by mouth every 6 (six) hours as needed., Historical Med    Alum & Mag Hydroxide-Simeth (GI COCKTAIL) SUSP suspension Take 30 mLs by mouth 3 (three) times daily as needed for indigestion (or chest pain). Shake well., Starting Wed 07/06/2016, Print    HYDROmorphone (DILAUDID) 2 MG tablet Take 0.5 tablets (1 mg total) by mouth every 6 (six) hours as needed for severe pain., Starting Wed 07/06/2016, Print      STOP taking these medications     amLODipine-benazepril (LOTREL) 10-20 MG capsule      torsemide (DEMADEX) 20 MG tablet          DISCHARGE INSTRUCTIONS:    The patient is to follow-up with primary care physician, Dr. Grayland Ormond as outpatient  If you experience worsening of your admission symptoms, develop shortness of breath, life threatening emergency, suicidal or homicidal thoughts you must seek medical attention immediately by calling 911 or calling your MD immediately  if symptoms less severe.  You Must read complete instructions/literature along with all the possible adverse reactions/side effects for all the Medicines you take and that have been prescribed to you. Take any new Medicines after you have completely understood and accept all the possible adverse reactions/side effects.   Please note  You were cared for by a hospitalist during your hospital stay. If you have any questions about your discharge medications or the care you received while you were in the hospital after you are discharged, you can call the unit and asked to speak with the hospitalist on call if the hospitalist that took care of you is not available. Once you are discharged, your primary care physician will handle any further  medical issues. Please note that NO REFILLS for any discharge medications will be authorized once you are discharged, as it is imperative that you return to your primary care physician (or establish a relationship with a primary care physician if you do not have one) for your aftercare needs so that they can reassess your need for medications and monitor your lab values.    Today   CHIEF COMPLAINT:   Chief Complaint  Patient presents with  . Weakness  . Chest Pain    HISTORY OF PRESENT ILLNESS:  [name]  is a 83 y.o. female with a known history of coronary artery disease, status post stent placements, hypertension, COPD, lung cancer, diabetes, hyperlipidemia, obstructive sleep apnea, chronic pain syndrome, who presents to the hospitalist presyncope on exertion . She has very similar episode in the hospital while she was walking. She was seen by cardiologist, recommended no further intervention,  was felt to be hyperkalemia related. Junctional rhythm has resolved, likely hyperkalemia related per  cardiology. The patient continued to have intermittent episodes of feeling like she has been drained of energy, not even on exertion. While in the hospital. She blamed Ranexa,  It was stopped. She'll orthostatic vital signs were unremarkable. She also complained of intermittent urination, urgency. Bladder scan revealed only 6 cc. Urinalysis was unremarkable, urine culture no growth. Repeated orthostatic vital signs were found to be markedly abnormal with systolic blood pressure dropping down from 160-100. IV fluid was given and patient's orthostatic blood pressure changes resolved. Initially, the patient  was evaluated by physical therapist and recommended to be discharged to skilled nursing facility for rehabilitation, however, after she was rehydrated, she felt comfortable to go home with home health services. She is being discharged home today.  Discussion by problem:  #1. Near syncope due to  orthostatic hypotension, improved. The patient had recent evaluation by cardiologist as outpatient who recommended no further evaluation . Initial orthostatic vital signs were unremarkable.  Carotid ultrasound revealed left ICA stenosis of 50-69%, MRA of carotids and the brain revealed no significant stenosis, although test was without contrast, echocardiogram was performed about a month ago, revealing normal ejection fraction, diastolic dysfunction,  CT angiogram of the heart revealed patent stents, per cardiologist, conservative measures were recommended. Suspended Ranexa, as there was a  concern that patient's symptoms could be partially related to the medication.  Patient was seen by physical therapist, discharging patient home with home health services #2. Hyponatremia, resolved #3. Hyperkalemia, resolved, likely due to ACE inhibitor, which is stopped and is to follow-up with primary cardiologist to make decisions about the reinitiation of ACE inhibitor as outpatient #4 acute on chronic renal insufficiency, improved to baseline #5. Anemia, hemoglobin level 8.6 on the day of discharge, patient's family told me that patient has history of pernicious anemia, vitamin B12 shot is going to be given prior to discharge from the hospital. The patient was recommended to follow-up with Dr. Grayland Ormond as outpatient for further recommendations #6, Dysuria, urinalysis was unremarkable, post vodal bladder scan was 60 cc, urine culture was negative. Patient may benefit from urologist evaluation as outpatient. Discussed with family and patient.  #7 orthostatic hypotension, IV fluid bolus and IV fluids, checking orthostatic vital signs every shift, resolved   VITAL SIGNS:  Blood pressure (!) 137/51, pulse 91, temperature 98.6 F (37 C), temperature source Oral, resp. rate 18, height '5\' 5"'$  (1.651 m), weight 89.4 kg (197 lb 3.2 oz), SpO2 100 %.  I/O:   Intake/Output Summary (Last 24 hours) at 08/16/16 1705 Last data  filed at 08/16/16 1347  Gross per 24 hour  Intake             2110 ml  Output                0 ml  Net             2110 ml    PHYSICAL EXAMINATION:  GENERAL:  83 y.o.-year-old patient lying in the bed with no acute distress.  EYES: Pupils equal, round, reactive to light and accommodation. No scleral icterus. Extraocular muscles intact.  HEENT: Head atraumatic, normocephalic. Oropharynx and nasopharynx clear.  NECK:  Supple, no jugular venous distention. No thyroid enlargement, no tenderness.  LUNGS: Normal breath sounds bilaterally, no wheezing, rales,rhonchi or crepitation. No use of accessory muscles of respiration.  CARDIOVASCULAR: S1, S2 normal. No murmurs, rubs, or gallops.  ABDOMEN: Soft, non-tender, non-distended. Bowel sounds present. No organomegaly or mass.  EXTREMITIES: No pedal edema, cyanosis, or clubbing.  NEUROLOGIC: Cranial nerves II through XII are intact. Muscle strength 5/5 in all extremities. Sensation intact. Gait not checked.  PSYCHIATRIC: The patient is alert and oriented x 3.  SKIN: No obvious rash, lesion, or ulcer.   DATA REVIEW:   CBC  Recent Labs Lab 08/12/16 1530  08/16/16 0658  WBC 4.2  --   --   HGB 9.9*  < > 8.6*  HCT 29.5*  --   --   PLT 230  --   --   < > = values in this interval not displayed.  Chemistries   Recent Labs Lab 08/16/16 0658  NA 139  K 3.7  CL 105  CO2 28  GLUCOSE 84  BUN 14  CREATININE 0.91  CALCIUM 8.4*    Cardiac Enzymes  Recent Labs Lab 08/14/16 1450  TROPONINI <0.03    Microbiology Results  Results for orders placed or performed during the hospital encounter of 08/12/16  Urine culture     Status: None   Collection Time: 08/14/16  1:03 PM  Result Value Ref Range Status   Specimen Description URINE, CLEAN CATCH  Final   Special Requests NONE  Final   Culture   Final    NO GROWTH Performed at [facility], [place] [address], [place], [place] [address]    Report Status 08/15/2016 FINAL  Final     RADIOLOGY:  No results found.  EKG:   Orders placed or performed during the hospital encounter of 08/12/16  . ED EKG within 10 minutes  . ED EKG within 10 minutes  . EKG 12-Lead  . EKG 12-Lead  . EKG 12-Lead  . EKG 12-Lead  . EKG 12-Lead  . EKG 12-Lead  . EKG 12-Lead  . EKG 12-Lead      Management plans discussed with the patient, family and they are in agreement.  CODE STATUS:     Code Status Orders        Start     Ordered   08/12/16 1848  Full code  Continuous     08/12/16 1847    Code Status History    Date Active Date Inactive Code Status Order ID Comments User Context   07/05/2016 12:07 AM 07/06/2016  8:06 PM Full Code [id number]  Harvie Bridge, DO Inpatient   06/27/2015  5:31 PM 06/29/2015  8:52 PM Full Code [id number]  Lytle Butte, MD ED    Advance Directive Documentation     Most Recent Value  Type of Advance Directive  Living will  Pre-existing out of facility DNR order (yellow form or pink MOST form)  -  "MOST" Form in Place?  -      TOTAL TIME TAKING CARE OF THIS PATIENT: 40 minutes.    Theodoro Grist M.D on 08/16/2016 at 5:05 PM  Between 7am to 6pm - Pager - [phone]  After 6pm go to [email or web address] - password [email or web address]  Office  [phone]  CC: Primary care physician; Lorelee Market, MD

## 2016-08-21 NOTE — Progress Notes (Deleted)
[place]  Telephone:([phone]) [phone] [phone]) [phone]  ID: [name] OB: 1925-06-24  MR#: [id number]  AOZ#:[id number]  Patient Care Team: Lorelee Market, MD as PCP - General (Family Medicine)  CHIEF COMPLAINT: Pernicious anemia.  INTERVAL HISTORY: ***  REVIEW OF SYSTEMS:   ROS  As per HPI. Otherwise, a complete review of systems is negative.  PAST MEDICAL HISTORY: Past Medical History:  Diagnosis Date  . Arrhythmia   . Arthritis   . Asthma   . Atrial fibrillation ([place])   . Cancer ([place])    lung cancer  . COPD (chronic obstructive pulmonary disease) ([place])   . Coronary artery disease   . Depression   . Diabetes mellitus without complication ([place])   . Heart attack   . Heart disease   . Heart failure ([place])   . Heartburn   . HLD (hyperlipidemia)   . Hypertension   . Hyperthyroidism   . Kidney failure   . Sleep apnea   . Stroke [facility])     PAST SURGICAL HISTORY: Past Surgical History:  Procedure Laterality Date  . CHOLECYSTECTOMY  1983  . EYE SURGERY  2012/2016  . THYROID SURGERY  1963  . TOTAL HIP ARTHROPLASTY Left 2008    FAMILY HISTORY: Family History  Problem Relation Age of Onset  . Diabetes Other   . Hypertension Other   . Hematuria Father   . Kidney failure Father   . Kidney cancer Brother   . Kidney failure Mother   . Kidney Stones Brother     ADVANCED DIRECTIVES (Y/N):  N  HEALTH MAINTENANCE: Social History  Substance Use Topics  . Smoking status: Never Smoker  . Smokeless tobacco: Never Used  . Alcohol use No     Colonoscopy:  PAP:  Bone density:  Lipid panel:  Allergies  Allergen Reactions  . Bactrim [Sulfamethoxazole-Trimethoprim]     "Diabetic seizures"  . Darvon [Propoxyphene] Other (See Comments)    Patient states she was real weak, felt like she was going to pass out.  . Albuterol Palpitations and Other (See Comments)    Irregular heart beat, CAN TOLERATE XOPENEX  . Fentanyl Nausea And Vomiting  and Rash  . Latex Rash  . Tetracyclines & Related Rash    Current Outpatient Prescriptions  Medication Sig Dispense Refill  . ALPRAZolam (XANAX) 0.25 MG tablet Take 1 tablet (0.25 mg total) by mouth 2 (two) times daily as needed for anxiety. 20 tablet 0  . Alum & Mag Hydroxide-Simeth (GI COCKTAIL) SUSP suspension Take 30 mLs by mouth 3 (three) times daily as needed for indigestion (or chest pain). Shake well. 90 mL 0  . amLODipine (NORVASC) 10 MG tablet Take 1 tablet (10 mg total) by mouth daily. 30 tablet 3  . aspirin EC 81 MG EC tablet Take 1 tablet (81 mg total) by mouth daily. 30 tablet 0  . Choline Fenofibrate (FENOFIBRIC ACID) 135 MG CPDR Take 135 mg by mouth daily. Reported on 11/24/2015    . clopidogrel (PLAVIX) 75 MG tablet Take 1 tablet (75 mg total) by mouth daily. 30 tablet 0  . estradiol (ESTRACE VAGINAL) 0.1 MG/GM vaginal cream Apply 0.'5mg'$  (pea-sized amount)  just inside the vaginal introitus with a finger-tip every night for two weeks and then Monday, Wednesday and Friday nights. 30 g 12  . gabapentin (NEURONTIN) 100 MG capsule Take 100 mg by mouth 2 (two) times daily.    Marland Kitchen glipiZIDE (GLUCOTROL XL) 10 MG 24 hr tablet Take 10 mg by  mouth daily with breakfast.    . HYDROmorphone (DILAUDID) 2 MG tablet Take 0.5 tablets (1 mg total) by mouth every 6 (six) hours as needed for severe pain. (Patient not taking: Reported on 08/12/2016) 10 tablet 0  . iron polysaccharides (NIFEREX) 150 MG capsule Take 1 capsule (150 mg total) by mouth daily. 30 capsule 0  . isosorbide mononitrate (IMDUR) 60 MG 24 hr tablet Take 60 mg by mouth 2 (two) times daily.    Marland Kitchen levalbuterol (XOPENEX HFA) 45 MCG/ACT inhaler Inhale 1 puff into the lungs 4 (four) times daily as needed. For wheezing/shortness of breath.    . levalbuterol (XOPENEX) 1.25 MG/3ML nebulizer solution Take 1.25 mg by nebulization every 4 (four) hours as needed for wheezing or shortness of breath.    . levothyroxine (SYNTHROID, LEVOTHROID) 125  MCG tablet Take 125 mcg by mouth daily before breakfast.    . meclizine (ANTIVERT) 25 MG tablet Take 25 mg by mouth 2 (two) times daily.    . meloxicam (MOBIC) 7.5 MG tablet Take 7.5 mg by mouth every other day.    . montelukast (SINGULAIR) 10 MG tablet Take 10 mg by mouth daily.    . nitroGLYCERIN (NITROSTAT) 0.4 MG SL tablet Place 1 tablet (0.4 mg total) under the tongue every 5 (five) minutes as needed for chest pain. 10 tablet 12  . pantoprazole (PROTONIX) 40 MG tablet Take 1 tablet (40 mg total) by mouth daily. 30 tablet 0  . ranolazine (RANEXA) 500 MG 12 hr tablet Take 1 tablet (500 mg total) by mouth 2 (two) times daily. 60 tablet 0  . rosuvastatin (CRESTOR) 20 MG tablet Take 20 mg by mouth daily.     Marland Kitchen SPIRIVA HANDIHALER 18 MCG inhalation capsule Place 18 mcg into inhaler and inhale daily.    . traMADol-acetaminophen (ULTRACET) 37.5-325 MG tablet Take 1 tablet by mouth every 6 (six) hours as needed.     No current facility-administered medications for this visit.     OBJECTIVE: There were no vitals filed for this visit.   There is no height or weight on file to calculate BMI.    ECOG FS:{CHL ONC [id number]  General: Well-developed, well-nourished, no acute distress. Eyes: Pink conjunctiva, anicteric sclera. HEENT: Normocephalic, moist mucous membranes, clear oropharnyx. Lungs: Clear to auscultation bilaterally. Heart: Regular rate and rhythm. No rubs, murmurs, or gallops. Abdomen: Soft, nontender, nondistended. No organomegaly noted, normoactive bowel sounds. Musculoskeletal: No edema, cyanosis, or clubbing. Neuro: Alert, answering all questions appropriately. Cranial nerves grossly intact. Skin: No rashes or petechiae noted. Psych: Normal affect. Lymphatics: No cervical, calvicular, axillary or inguinal LAD.   LAB RESULTS:  Lab Results  Component Value Date   NA 139 08/16/2016   K 3.7 08/16/2016   CL 105 08/16/2016   CO2 28 08/16/2016   GLUCOSE 84 08/16/2016   BUN  14 08/16/2016   CREATININE 0.91 08/16/2016   CALCIUM 8.4 (L) 08/16/2016   PROT 7.3 06/27/2015   ALBUMIN 3.9 06/27/2015   AST 21 06/27/2015   ALT 11 (L) 06/27/2015   ALKPHOS 33 (L) 06/27/2015   BILITOT <0.1 (L) 06/27/2015   GFRNONAA 55 (L) 08/16/2016   GFRAA >60 08/16/2016    Lab Results  Component Value Date   WBC 4.2 08/12/2016   NEUTROABS 2.3 07/04/2016   HGB 8.6 (L) 08/16/2016   HCT 29.5 (L) 08/12/2016   MCV 89.2 08/12/2016   PLT 230 08/12/2016     STUDIES: Dg Chest 2 View  Result Date: 08/12/2016 CLINICAL DATA:  Abnormal EKG. Heaviness in the chest. Near syncopal episode last night. Right lung cancer. EXAM: CHEST  2 VIEW COMPARISON:  07/04/2016. FINDINGS: Enlarged cardiac silhouette with a mild increase in size. No gross change in the previously demonstrated right hilar mass or perihilar fibrosis. Stable right jugular porta catheter. Coronary artery stent. Stable minimally prominent interstitial markings. Aortic arch calcifications. Interval linear density in the right mid lung zone. Diffuse osteopenia. Thoracic spine degenerative changes. IMPRESSION: 1. Interval linear atelectasis or scarring in the right mid lung zone. 2. Progressive cardiomegaly. 3. Stable right hilar mass or fibrosis. 4. Aortic atherosclerosis. Electronically Signed   By: Claudie Revering M.D.   On: 08/12/2016 15:28   Mr Jodene Nam Head Wo Contrast  Result Date: 08/14/2016 CLINICAL DATA:  History of hypertension.  Near syncope.  Weakness. EXAM: MRA NECK WITHOUT  CONTRAST MRA HEAD WITHOUT CONTRAST TECHNIQUE: Multiplanar and multiecho pulse sequences of the neck were obtained without intravenous contrast. Angiographic images of the neck were obtained using MRA technique without intravenous contast.; Angiographic images of the Circle of Willis were obtained using MRA technique without intravenous contrast. COMPARISON:  12/11/2013 FINDINGS: MRA NECK FINDINGS Limited detail without contrast. There is antegrade flow in both  common carotid arteries. Both vessels are widely patent to the bifurcation regions. There is 20% stenosis of the proximal ICA on the right. There is 40% stenosis of the proximal ICA on the left. Cervical internal carotid artery's widely patent beyond that. There is some signal loss at the skullbase presumed secondary to susceptibility artifact. Both vertebral arteries are approximately equal in size and show antegrade flow without stenosis. MRA HEAD FINDINGS Both internal carotid arteries are widely patent through the skullbase and siphon regions. The anterior and middle cerebral vessels are patent without proximal stenosis, aneurysm or vascular malformation. Both vertebral arteries are patent to the basilar with the left being dominant. Incidental fenestration of the distal left vertebral artery. No stenosis. Posterior circulation branch vessels show flow. IMPRESSION: Normal intracranial MR angiography of the large and medium size vessels. MR angiography of the neck vessels without contrast does not show any likely significant finding. Detail is limited without contrast. Suspect 20% stenosis at the proximal ICA on the right and 40% stenosis on the left. Noninvasive ultrasound study likely more accurate than this noncontrast neck exam. Electronically Signed   By: Nelson Chimes M.D.   On: 08/14/2016 12:43   Mr Jodene Nam Neck Wo Contrast  Result Date: 08/14/2016 CLINICAL DATA:  History of hypertension.  Near syncope.  Weakness. EXAM: MRA NECK WITHOUT  CONTRAST MRA HEAD WITHOUT CONTRAST TECHNIQUE: Multiplanar and multiecho pulse sequences of the neck were obtained without intravenous contrast. Angiographic images of the neck were obtained using MRA technique without intravenous contast.; Angiographic images of the Circle of Willis were obtained using MRA technique without intravenous contrast. COMPARISON:  12/11/2013 FINDINGS: MRA NECK FINDINGS Limited detail without contrast. There is antegrade flow in both common  carotid arteries. Both vessels are widely patent to the bifurcation regions. There is 20% stenosis of the proximal ICA on the right. There is 40% stenosis of the proximal ICA on the left. Cervical internal carotid artery's widely patent beyond that. There is some signal loss at the skullbase presumed secondary to susceptibility artifact. Both vertebral arteries are approximately equal in size and show antegrade flow without stenosis. MRA HEAD FINDINGS Both internal carotid arteries are widely patent through the skullbase and siphon regions. The anterior and middle cerebral vessels are patent without proximal stenosis, aneurysm or  vascular malformation. Both vertebral arteries are patent to the basilar with the left being dominant. Incidental fenestration of the distal left vertebral artery. No stenosis. Posterior circulation branch vessels show flow. IMPRESSION: Normal intracranial MR angiography of the large and medium size vessels. MR angiography of the neck vessels without contrast does not show any likely significant finding. Detail is limited without contrast. Suspect 20% stenosis at the proximal ICA on the right and 40% stenosis on the left. Noninvasive ultrasound study likely more accurate than this noncontrast neck exam. Electronically Signed   By: Nelson Chimes M.D.   On: 08/14/2016 12:43   US Carotid Bilateral  Result Date: 08/13/2016 CLINICAL DATA:  Cerebral infarction. History of CAD, hypertension, hyperlipidemia, diabetes and smoking. EXAM: BILATERAL CAROTID DUPLEX ULTRASOUND TECHNIQUE: Pearline Cables scale imaging, color Doppler and duplex ultrasound were performed of bilateral carotid and vertebral arteries in the neck. COMPARISON:  None. FINDINGS: Criteria: Quantification of carotid stenosis is based on velocity parameters that correlate the residual internal carotid diameter with NASCET-based stenosis levels, using the diameter of the distal internal carotid lumen as the denominator for stenosis  measurement. The following velocity measurements were obtained: RIGHT ICA:  104/22 cm/sec CCA:  29/56 cm/sec SYSTOLIC ICA/CCA RATIO:  1.2 DIASTOLIC ICA/CCA RATIO:  1.3 ECA:  318 cm/sec LEFT ICA:  196/29 cm/sec CCA:  21/30 cm/sec SYSTOLIC ICA/CCA RATIO:  2.0 DIASTOLIC ICA/CCA RATIO:  1.6 ECA:  226 cm/sec RIGHT CAROTID ARTERY: There is mild tortuosity of the right common carotid artery (image 3). There is a moderate amount of eccentric mixed echogenic partially shadowing plaque involving the origin and proximal aspects of the right internal carotid artery (image 22), not resulting in elevated peak systolic velocities within the interrogated course the right internal carotid artery to suggest a hemodynamically significant stenosis. RIGHT VERTEBRAL ARTERY:  Antegrade flow LEFT CAROTID ARTERY: There is a moderate to large amount of eccentric mixed echogenic partially shadowing plaque within the left carotid bulb (image 47), extending to involve the origin and proximal aspects of the left internal carotid artery (image 54), which results in elevated peak systolic velocities within the proximal aspect of the left internal carotid artery (greatest acquired peak systolic velocity with the proximal ICA measures 196 cm/sec - image 56). LEFT VERTEBRAL ARTERY:  Antegrade Flow IMPRESSION: 1. Moderate to large amount of left-sided atherosclerotic plaque results in elevated peak systolic velocities within the left internal carotid artery compatible with the higher end of the 50-69% luminal narrowing range. Further evaluation with CTA could performed as clinically indicated. 2. Moderate amount of right-sided atherosclerotic plaque, not definitely resulting in a hemodynamically significant stenosis. Electronically Signed   By: Sandi Mariscal M.D.   On: 08/13/2016 15:25    ASSESSMENT: Pernicious anemia  PLAN:    1. Pernicious anemia:  Patient expressed understanding and was in agreement with this plan. She also understands that  She can call clinic at any time with any questions, concerns, or complaints.   Cancer Staging No matching staging information was found for the patient.  Lloyd Huger, MD   08/21/2016 3:56 PM

## 2016-08-23 ENCOUNTER — Inpatient Hospital Stay: Payer: Medicare Other | Admitting: Oncology

## 2016-08-28 NOTE — Progress Notes (Addendum)
[place]  [phone] [phone]  ID: [name] OB: 1925/11/23  MR#: [id number]  LDJ#:[id number]  Patient Care Team: Lorelee Market, MD as PCP - General (Family Medicine)  CHIEF COMPLAINT: Pernicious anemia.  INTERVAL HISTORY: Patient is an 83 year old female last evaluated in clinic revealed 5 years ago who is referred for hospital follow-up and a history of pernicious anemia. She admits to continued weakness and fatigue, but otherwise feels well. She has no neurologic complaints. She denies any recent fevers. She has good appetite and denies weight loss. She has noted chest pain or shortness of breath. She denies any nausea, vomiting, constipation, or diarrhea. She denies any melena or hematochezia. She has no urinary complaints. Patient offers no further specific complaints today.   REVIEW OF SYSTEMS:   Review of Systems  Constitutional: Positive for malaise/fatigue. Negative for fever and weight loss.  Respiratory: Negative.  Negative for cough and shortness of breath.   Cardiovascular: Negative.  Negative for chest pain and leg swelling.  Gastrointestinal: Negative.  Negative for abdominal pain, blood in stool and melena.  Genitourinary: Negative.   Musculoskeletal: Negative.   Neurological: Positive for weakness.  Psychiatric/Behavioral: Negative.  The patient is not nervous/anxious.     As per HPI. Otherwise, a complete review of systems is negative.  PAST MEDICAL HISTORY: Past Medical History:  Diagnosis Date  . Arrhythmia   . Arthritis   . Asthma   . Atrial fibrillation ([place])   . Cancer ([place])    lung cancer  . COPD (chronic obstructive pulmonary disease) ([place])   . Coronary artery disease   . Depression   . Diabetes mellitus without complication ([place])   . Heart attack   . Heart disease   . Heart failure ([place])   . Heartburn   . HLD (hyperlipidemia)   . Hypertension   . Hyperthyroidism   . Kidney failure   . Sleep  apnea   . Stroke [facility])     PAST SURGICAL HISTORY: Past Surgical History:  Procedure Laterality Date  . CHOLECYSTECTOMY  1983  . EYE SURGERY  2012/2016  . THYROID SURGERY  1963  . TOTAL HIP ARTHROPLASTY Left 2008    FAMILY HISTORY: Family History  Problem Relation Age of Onset  . Diabetes Other   . Hypertension Other   . Hematuria Father   . Kidney failure Father   . Kidney cancer Brother   . Kidney failure Mother   . Kidney Stones Brother     ADVANCED DIRECTIVES (Y/N):  N  HEALTH MAINTENANCE: Social History  Substance Use Topics  . Smoking status: Never Smoker  . Smokeless tobacco: Never Used  . Alcohol use No     Colonoscopy:  PAP:  Bone density:  Lipid panel:  Allergies  Allergen Reactions  . Bactrim [Sulfamethoxazole-Trimethoprim]     "Diabetic seizures"  . Darvon [Propoxyphene] Other (See Comments)    Patient states she was real weak, felt like she was going to pass out.  . Albuterol Palpitations and Other (See Comments)    Irregular heart beat, CAN TOLERATE XOPENEX  . Fentanyl Nausea And Vomiting and Rash  . Latex Rash  . Tetracyclines & Related Rash    Current Outpatient Prescriptions  Medication Sig Dispense Refill  . ALPRAZolam (XANAX) 0.25 MG tablet Take 1 tablet (0.25 mg total) by mouth 2 (two) times daily as needed for anxiety. 20 tablet 0  . Alum & Mag Hydroxide-Simeth (GI COCKTAIL) SUSP suspension Take 30 mLs by mouth  3 (three) times daily as needed for indigestion (or chest pain). Shake well. 90 mL 0  . amiodarone (PACERONE) 200 MG tablet Take by mouth daily. Take 3 tabs daily x 5 days, then take 2 tabs daily x 5 day, then take 1 tab daily    . amLODipine (NORVASC) 10 MG tablet Take 1 tablet (10 mg total) by mouth daily. 30 tablet 3  . aspirin EC 81 MG EC tablet Take 1 tablet (81 mg total) by mouth daily. 30 tablet 0  . Choline Fenofibrate (FENOFIBRIC ACID) 135 MG CPDR Take 135 mg by mouth daily. Reported on 11/24/2015    . clopidogrel  (PLAVIX) 75 MG tablet Take 1 tablet (75 mg total) by mouth daily. 30 tablet 0  . estradiol (ESTRACE VAGINAL) 0.1 MG/GM vaginal cream Apply 0.'5mg'$  (pea-sized amount)  just inside the vaginal introitus with a finger-tip every night for two weeks and then Monday, Wednesday and Friday nights. 30 g 12  . gabapentin (NEURONTIN) 100 MG capsule Take 100 mg by mouth 2 (two) times daily.    Marland Kitchen glipiZIDE (GLUCOTROL XL) 10 MG 24 hr tablet Take 10 mg by mouth daily with breakfast.    . HYDROmorphone (DILAUDID) 2 MG tablet Take 0.5 tablets (1 mg total) by mouth every 6 (six) hours as needed for severe pain. 10 tablet 0  . iron polysaccharides (NIFEREX) 150 MG capsule Take 1 capsule (150 mg total) by mouth daily. 30 capsule 0  . isosorbide mononitrate (IMDUR) 60 MG 24 hr tablet Take 60 mg by mouth 2 (two) times daily.    Marland Kitchen levalbuterol (XOPENEX HFA) 45 MCG/ACT inhaler Inhale 1 puff into the lungs 4 (four) times daily as needed. For wheezing/shortness of breath.    . levalbuterol (XOPENEX) 1.25 MG/3ML nebulizer solution Take 1.25 mg by nebulization every 4 (four) hours as needed for wheezing or shortness of breath.    . levothyroxine (SYNTHROID, LEVOTHROID) 125 MCG tablet Take 125 mcg by mouth daily before breakfast.    . meclizine (ANTIVERT) 25 MG tablet Take 25 mg by mouth 2 (two) times daily.    . meloxicam (MOBIC) 7.5 MG tablet Take 7.5 mg by mouth every other day.    . montelukast (SINGULAIR) 10 MG tablet Take 10 mg by mouth daily.    . nitroGLYCERIN (NITROSTAT) 0.4 MG SL tablet Place 1 tablet (0.4 mg total) under the tongue every 5 (five) minutes as needed for chest pain. 10 tablet 12  . pantoprazole (PROTONIX) 40 MG tablet Take 1 tablet (40 mg total) by mouth daily. 30 tablet 0  . ranolazine (RANEXA) 500 MG 12 hr tablet Take 1 tablet (500 mg total) by mouth 2 (two) times daily. 60 tablet 0  . rosuvastatin (CRESTOR) 20 MG tablet Take 20 mg by mouth daily.     Marland Kitchen SPIRIVA HANDIHALER 18 MCG inhalation capsule Place  18 mcg into inhaler and inhale daily.    . traMADol-acetaminophen (ULTRACET) 37.5-325 MG tablet Take 1 tablet by mouth every 6 (six) hours as needed.     No current facility-administered medications for this visit.     OBJECTIVE: Vitals:   08/29/16 1156  BP: (!) 157/73  Pulse: 94  Resp: 18  Temp: 99.2 F (37.3 C)     Body mass index is 32.72 kg/m.    ECOG FS:1 - Symptomatic but completely ambulatory  General: Well-developed, well-nourished, no acute distress. Eyes: Pink conjunctiva, anicteric sclera. HEENT: Normocephalic, moist mucous membranes, clear oropharnyx. Lungs: Clear to auscultation bilaterally. Heart: Regular  rate and rhythm. No rubs, murmurs, or gallops. Abdomen: Soft, nontender, nondistended. No organomegaly noted, normoactive bowel sounds. Musculoskeletal: No edema, cyanosis, or clubbing. Neuro: Alert, answering all questions appropriately. Cranial nerves grossly intact. Skin: No rashes or petechiae noted. Psych: Normal affect. Lymphatics: No cervical, calvicular, axillary or inguinal LAD.   LAB RESULTS:  Lab Results  Component Value Date   NA 139 08/16/2016   K 3.7 08/16/2016   CL 105 08/16/2016   CO2 28 08/16/2016   GLUCOSE 84 08/16/2016   BUN 14 08/16/2016   CREATININE 0.91 08/16/2016   CALCIUM 8.4 (L) 08/16/2016   PROT 7.3 06/27/2015   ALBUMIN 3.9 06/27/2015   AST 21 06/27/2015   ALT 11 (L) 06/27/2015   ALKPHOS 33 (L) 06/27/2015   BILITOT <0.1 (L) 06/27/2015   GFRNONAA 55 (L) 08/16/2016   GFRAA >60 08/16/2016    Lab Results  Component Value Date   WBC 4.6 08/29/2016   NEUTROABS 2.9 08/29/2016   HGB 9.8 (L) 08/29/2016   HCT 28.2 (L) 08/29/2016   MCV 84.9 08/29/2016   PLT 235 08/29/2016     STUDIES: Dg Chest 2 View  Result Date: 08/12/2016 CLINICAL DATA:  Abnormal EKG. Heaviness in the chest. Near syncopal episode last night. Right lung cancer. EXAM: CHEST  2 VIEW COMPARISON:  07/04/2016. FINDINGS: Enlarged cardiac silhouette with a  mild increase in size. No gross change in the previously demonstrated right hilar mass or perihilar fibrosis. Stable right jugular porta catheter. Coronary artery stent. Stable minimally prominent interstitial markings. Aortic arch calcifications. Interval linear density in the right mid lung zone. Diffuse osteopenia. Thoracic spine degenerative changes. IMPRESSION: 1. Interval linear atelectasis or scarring in the right mid lung zone. 2. Progressive cardiomegaly. 3. Stable right hilar mass or fibrosis. 4. Aortic atherosclerosis. Electronically Signed   By: Claudie Revering M.D.   On: 08/12/2016 15:28   Mr Jodene Nam Head Wo Contrast  Result Date: 08/14/2016 CLINICAL DATA:  History of hypertension.  Near syncope.  Weakness. EXAM: MRA NECK WITHOUT  CONTRAST MRA HEAD WITHOUT CONTRAST TECHNIQUE: Multiplanar and multiecho pulse sequences of the neck were obtained without intravenous contrast. Angiographic images of the neck were obtained using MRA technique without intravenous contast.; Angiographic images of the Circle of Willis were obtained using MRA technique without intravenous contrast. COMPARISON:  12/11/2013 FINDINGS: MRA NECK FINDINGS Limited detail without contrast. There is antegrade flow in both common carotid arteries. Both vessels are widely patent to the bifurcation regions. There is 20% stenosis of the proximal ICA on the right. There is 40% stenosis of the proximal ICA on the left. Cervical internal carotid artery's widely patent beyond that. There is some signal loss at the skullbase presumed secondary to susceptibility artifact. Both vertebral arteries are approximately equal in size and show antegrade flow without stenosis. MRA HEAD FINDINGS Both internal carotid arteries are widely patent through the skullbase and siphon regions. The anterior and middle cerebral vessels are patent without proximal stenosis, aneurysm or vascular malformation. Both vertebral arteries are patent to the basilar with the left  being dominant. Incidental fenestration of the distal left vertebral artery. No stenosis. Posterior circulation branch vessels show flow. IMPRESSION: Normal intracranial MR angiography of the large and medium size vessels. MR angiography of the neck vessels without contrast does not show any likely significant finding. Detail is limited without contrast. Suspect 20% stenosis at the proximal ICA on the right and 40% stenosis on the left. Noninvasive ultrasound study likely more accurate than this noncontrast neck  exam. Electronically Signed   By: Nelson Chimes M.D.   On: 08/14/2016 12:43   Mr Jodene Nam Neck Wo Contrast  Result Date: 08/14/2016 CLINICAL DATA:  History of hypertension.  Near syncope.  Weakness. EXAM: MRA NECK WITHOUT  CONTRAST MRA HEAD WITHOUT CONTRAST TECHNIQUE: Multiplanar and multiecho pulse sequences of the neck were obtained without intravenous contrast. Angiographic images of the neck were obtained using MRA technique without intravenous contast.; Angiographic images of the Circle of Willis were obtained using MRA technique without intravenous contrast. COMPARISON:  12/11/2013 FINDINGS: MRA NECK FINDINGS Limited detail without contrast. There is antegrade flow in both common carotid arteries. Both vessels are widely patent to the bifurcation regions. There is 20% stenosis of the proximal ICA on the right. There is 40% stenosis of the proximal ICA on the left. Cervical internal carotid artery's widely patent beyond that. There is some signal loss at the skullbase presumed secondary to susceptibility artifact. Both vertebral arteries are approximately equal in size and show antegrade flow without stenosis. MRA HEAD FINDINGS Both internal carotid arteries are widely patent through the skullbase and siphon regions. The anterior and middle cerebral vessels are patent without proximal stenosis, aneurysm or vascular malformation. Both vertebral arteries are patent to the basilar with the left being  dominant. Incidental fenestration of the distal left vertebral artery. No stenosis. Posterior circulation branch vessels show flow. IMPRESSION: Normal intracranial MR angiography of the large and medium size vessels. MR angiography of the neck vessels without contrast does not show any likely significant finding. Detail is limited without contrast. Suspect 20% stenosis at the proximal ICA on the right and 40% stenosis on the left. Noninvasive ultrasound study likely more accurate than this noncontrast neck exam. Electronically Signed   By: Nelson Chimes M.D.   On: 08/14/2016 12:43   US Carotid Bilateral  Result Date: 08/13/2016 CLINICAL DATA:  Cerebral infarction. History of CAD, hypertension, hyperlipidemia, diabetes and smoking. EXAM: BILATERAL CAROTID DUPLEX ULTRASOUND TECHNIQUE: Pearline Cables scale imaging, color Doppler and duplex ultrasound were performed of bilateral carotid and vertebral arteries in the neck. COMPARISON:  None. FINDINGS: Criteria: Quantification of carotid stenosis is based on velocity parameters that correlate the residual internal carotid diameter with NASCET-based stenosis levels, using the diameter of the distal internal carotid lumen as the denominator for stenosis measurement. The following velocity measurements were obtained: RIGHT ICA:  104/22 cm/sec CCA:  32/95 cm/sec SYSTOLIC ICA/CCA RATIO:  1.2 DIASTOLIC ICA/CCA RATIO:  1.3 ECA:  318 cm/sec LEFT ICA:  196/29 cm/sec CCA:  18/84 cm/sec SYSTOLIC ICA/CCA RATIO:  2.0 DIASTOLIC ICA/CCA RATIO:  1.6 ECA:  226 cm/sec RIGHT CAROTID ARTERY: There is mild tortuosity of the right common carotid artery (image 3). There is a moderate amount of eccentric mixed echogenic partially shadowing plaque involving the origin and proximal aspects of the right internal carotid artery (image 22), not resulting in elevated peak systolic velocities within the interrogated course the right internal carotid artery to suggest a hemodynamically significant stenosis.  RIGHT VERTEBRAL ARTERY:  Antegrade flow LEFT CAROTID ARTERY: There is a moderate to large amount of eccentric mixed echogenic partially shadowing plaque within the left carotid bulb (image 47), extending to involve the origin and proximal aspects of the left internal carotid artery (image 54), which results in elevated peak systolic velocities within the proximal aspect of the left internal carotid artery (greatest acquired peak systolic velocity with the proximal ICA measures 196 cm/sec - image 56). LEFT VERTEBRAL ARTERY:  Antegrade Flow IMPRESSION: 1. Moderate to  large amount of left-sided atherosclerotic plaque results in elevated peak systolic velocities within the left internal carotid artery compatible with the higher end of the 50-69% luminal narrowing range. Further evaluation with CTA could performed as clinically indicated. 2. Moderate amount of right-sided atherosclerotic plaque, not definitely resulting in a hemodynamically significant stenosis. Electronically Signed   By: Sandi Mariscal M.D.   On: 08/13/2016 15:25    ASSESSMENT: Pernicious anemia  PLAN:    1. Pernicious anemia: Patient's hemoglobin has significantly improved from her admission to the hospital from 8.6 up to 9.8 today. She symptomatically improved as well. Patient received a B-12 injection while in the hospital. No intervention is needed at this time. Return to clinic in 1 month for continuation of B-12 injections at which point we will do a full anemia workup to ensure there is no other etiology of her anemia.  Approximately 45 minutes spent in discussion of which greater than 50% was consultation.  Patient expressed understanding and was in agreement with this plan. She also understands that She can call clinic at any time with any questions, concerns, or complaints.    Lloyd Huger, MD   09/01/2016 8:32 AM   Addendum: On April 03, 2015 patient an outside physician ordered a blood test that "suggest cancer cells may  be present". It is unclear what this blood test was. She also had a PET scan from the same time that revealed some abnormal activity localizes in the right lung possibly suggesting malignancy and plan was to repeat a PET scan which does not appear to have happened. Does not appear patient never had a biopsy.

## 2016-08-29 ENCOUNTER — Inpatient Hospital Stay: Payer: Medicare Other | Attending: Oncology | Admitting: Oncology

## 2016-08-29 ENCOUNTER — Inpatient Hospital Stay: Payer: Medicare Other

## 2016-08-29 VITALS — BP 157/73 | HR 94 | Temp 99.2°F | Resp 18 | Wt 196.6 lb

## 2016-08-29 DIAGNOSIS — I4891 Unspecified atrial fibrillation: Secondary | ICD-10-CM | POA: Insufficient documentation

## 2016-08-29 DIAGNOSIS — E0590 Thyrotoxicosis, unspecified without thyrotoxic crisis or storm: Secondary | ICD-10-CM | POA: Diagnosis not present

## 2016-08-29 DIAGNOSIS — I252 Old myocardial infarction: Secondary | ICD-10-CM | POA: Diagnosis not present

## 2016-08-29 DIAGNOSIS — D510 Vitamin B12 deficiency anemia due to intrinsic factor deficiency: Secondary | ICD-10-CM | POA: Diagnosis not present

## 2016-08-29 DIAGNOSIS — I509 Heart failure, unspecified: Secondary | ICD-10-CM | POA: Insufficient documentation

## 2016-08-29 DIAGNOSIS — E119 Type 2 diabetes mellitus without complications: Secondary | ICD-10-CM | POA: Insufficient documentation

## 2016-08-29 DIAGNOSIS — Z8673 Personal history of transient ischemic attack (TIA), and cerebral infarction without residual deficits: Secondary | ICD-10-CM | POA: Diagnosis not present

## 2016-08-29 DIAGNOSIS — E785 Hyperlipidemia, unspecified: Secondary | ICD-10-CM | POA: Diagnosis not present

## 2016-08-29 DIAGNOSIS — I2510 Atherosclerotic heart disease of native coronary artery without angina pectoris: Secondary | ICD-10-CM | POA: Diagnosis not present

## 2016-08-29 DIAGNOSIS — R5383 Other fatigue: Secondary | ICD-10-CM | POA: Insufficient documentation

## 2016-08-29 DIAGNOSIS — F329 Major depressive disorder, single episode, unspecified: Secondary | ICD-10-CM | POA: Diagnosis not present

## 2016-08-29 DIAGNOSIS — R531 Weakness: Secondary | ICD-10-CM | POA: Diagnosis not present

## 2016-08-29 DIAGNOSIS — G4730 Sleep apnea, unspecified: Secondary | ICD-10-CM | POA: Diagnosis not present

## 2016-08-29 DIAGNOSIS — J449 Chronic obstructive pulmonary disease, unspecified: Secondary | ICD-10-CM | POA: Insufficient documentation

## 2016-08-29 LAB — CBC WITH DIFFERENTIAL/PLATELET
BASOS ABS: 0 10*3/uL (ref 0–0.1)
Basophils Relative: 1 %
EOS PCT: 3 %
Eosinophils Absolute: 0.1 10*3/uL (ref 0–0.7)
HEMATOCRIT: 28.2 % — AB (ref 35.0–47.0)
HEMOGLOBIN: 9.8 g/dL — AB (ref 12.0–16.0)
LYMPHS ABS: 1 10*3/uL (ref 1.0–3.6)
LYMPHS PCT: 21 %
MCH: 29.5 pg (ref 26.0–34.0)
MCHC: 34.7 g/dL (ref 32.0–36.0)
MCV: 84.9 fL (ref 80.0–100.0)
Monocytes Absolute: 0.5 10*3/uL (ref 0.2–0.9)
Monocytes Relative: 12 %
NEUTROS ABS: 2.9 10*3/uL (ref 1.4–6.5)
NEUTROS PCT: 63 %
PLATELETS: 235 10*3/uL (ref 150–440)
RBC: 3.32 MIL/uL — AB (ref 3.80–5.20)
RDW: 12.7 % (ref 11.5–14.5)
WBC: 4.6 10*3/uL (ref 3.6–11.0)

## 2016-08-29 NOTE — Progress Notes (Signed)
States was recently hospitalized 2 weeks ago. Pt here to follow up on anemia. Feels weak and fatigued today.

## 2016-09-01 ENCOUNTER — Other Ambulatory Visit: Payer: Self-pay | Admitting: Oncology

## 2016-09-01 DIAGNOSIS — D510 Vitamin B12 deficiency anemia due to intrinsic factor deficiency: Secondary | ICD-10-CM

## 2016-09-05 ENCOUNTER — Encounter: Payer: Self-pay | Admitting: Emergency Medicine

## 2016-09-05 ENCOUNTER — Inpatient Hospital Stay
Admission: EM | Admit: 2016-09-05 | Discharge: 2016-09-07 | DRG: 871 | Disposition: A | Discharge status: Hospice | Payer: Medicare Other | Attending: Internal Medicine | Admitting: Internal Medicine

## 2016-09-05 ENCOUNTER — Other Ambulatory Visit: Payer: Self-pay

## 2016-09-05 ENCOUNTER — Emergency Department: Payer: Medicare Other

## 2016-09-05 DIAGNOSIS — I10 Essential (primary) hypertension: Secondary | ICD-10-CM | POA: Diagnosis present

## 2016-09-05 DIAGNOSIS — Z9049 Acquired absence of other specified parts of digestive tract: Secondary | ICD-10-CM

## 2016-09-05 DIAGNOSIS — I248 Other forms of acute ischemic heart disease: Secondary | ICD-10-CM | POA: Diagnosis present

## 2016-09-05 DIAGNOSIS — R001 Bradycardia, unspecified: Secondary | ICD-10-CM | POA: Diagnosis present

## 2016-09-05 DIAGNOSIS — I2510 Atherosclerotic heart disease of native coronary artery without angina pectoris: Secondary | ICD-10-CM | POA: Diagnosis present

## 2016-09-05 DIAGNOSIS — F329 Major depressive disorder, single episode, unspecified: Secondary | ICD-10-CM | POA: Diagnosis present

## 2016-09-05 DIAGNOSIS — F419 Anxiety disorder, unspecified: Secondary | ICD-10-CM | POA: Diagnosis present

## 2016-09-05 DIAGNOSIS — Z66 Do not resuscitate: Secondary | ICD-10-CM | POA: Diagnosis present

## 2016-09-05 DIAGNOSIS — N170 Acute kidney failure with tubular necrosis: Secondary | ICD-10-CM | POA: Diagnosis present

## 2016-09-05 DIAGNOSIS — Z9981 Dependence on supplemental oxygen: Secondary | ICD-10-CM | POA: Diagnosis not present

## 2016-09-05 DIAGNOSIS — Z7951 Long term (current) use of inhaled steroids: Secondary | ICD-10-CM

## 2016-09-05 DIAGNOSIS — J441 Chronic obstructive pulmonary disease with (acute) exacerbation: Secondary | ICD-10-CM | POA: Diagnosis present

## 2016-09-05 DIAGNOSIS — E039 Hypothyroidism, unspecified: Secondary | ICD-10-CM | POA: Diagnosis present

## 2016-09-05 DIAGNOSIS — Z515 Encounter for palliative care: Secondary | ICD-10-CM

## 2016-09-05 DIAGNOSIS — J440 Chronic obstructive pulmonary disease with acute lower respiratory infection: Secondary | ICD-10-CM | POA: Diagnosis present

## 2016-09-05 DIAGNOSIS — Z8673 Personal history of transient ischemic attack (TIA), and cerebral infarction without residual deficits: Secondary | ICD-10-CM

## 2016-09-05 DIAGNOSIS — J189 Pneumonia, unspecified organism: Secondary | ICD-10-CM

## 2016-09-05 DIAGNOSIS — E785 Hyperlipidemia, unspecified: Secondary | ICD-10-CM | POA: Diagnosis present

## 2016-09-05 DIAGNOSIS — J9601 Acute respiratory failure with hypoxia: Secondary | ICD-10-CM | POA: Diagnosis present

## 2016-09-05 DIAGNOSIS — N179 Acute kidney failure, unspecified: Secondary | ICD-10-CM

## 2016-09-05 DIAGNOSIS — G9341 Metabolic encephalopathy: Secondary | ICD-10-CM | POA: Diagnosis present

## 2016-09-05 DIAGNOSIS — R4781 Slurred speech: Secondary | ICD-10-CM

## 2016-09-05 DIAGNOSIS — E871 Hypo-osmolality and hyponatremia: Secondary | ICD-10-CM | POA: Diagnosis present

## 2016-09-05 DIAGNOSIS — R0602 Shortness of breath: Secondary | ICD-10-CM | POA: Diagnosis not present

## 2016-09-05 DIAGNOSIS — Z7902 Long term (current) use of antithrombotics/antiplatelets: Secondary | ICD-10-CM

## 2016-09-05 DIAGNOSIS — R339 Retention of urine, unspecified: Secondary | ICD-10-CM | POA: Diagnosis present

## 2016-09-05 DIAGNOSIS — A419 Sepsis, unspecified organism: Principal | ICD-10-CM | POA: Diagnosis present

## 2016-09-05 DIAGNOSIS — R6520 Severe sepsis without septic shock: Secondary | ICD-10-CM | POA: Diagnosis present

## 2016-09-05 DIAGNOSIS — E1142 Type 2 diabetes mellitus with diabetic polyneuropathy: Secondary | ICD-10-CM | POA: Diagnosis present

## 2016-09-05 DIAGNOSIS — G4733 Obstructive sleep apnea (adult) (pediatric): Secondary | ICD-10-CM | POA: Diagnosis present

## 2016-09-05 DIAGNOSIS — J181 Lobar pneumonia, unspecified organism: Secondary | ICD-10-CM

## 2016-09-05 DIAGNOSIS — Z7189 Other specified counseling: Secondary | ICD-10-CM | POA: Diagnosis not present

## 2016-09-05 DIAGNOSIS — Z888 Allergy status to other drugs, medicaments and biological substances status: Secondary | ICD-10-CM

## 2016-09-05 DIAGNOSIS — J90 Pleural effusion, not elsewhere classified: Secondary | ICD-10-CM | POA: Diagnosis present

## 2016-09-05 DIAGNOSIS — K219 Gastro-esophageal reflux disease without esophagitis: Secondary | ICD-10-CM | POA: Diagnosis present

## 2016-09-05 DIAGNOSIS — I959 Hypotension, unspecified: Secondary | ICD-10-CM | POA: Diagnosis present

## 2016-09-05 DIAGNOSIS — E860 Dehydration: Secondary | ICD-10-CM | POA: Diagnosis present

## 2016-09-05 DIAGNOSIS — Z7982 Long term (current) use of aspirin: Secondary | ICD-10-CM

## 2016-09-05 DIAGNOSIS — I4510 Unspecified right bundle-branch block: Secondary | ICD-10-CM | POA: Diagnosis present

## 2016-09-05 DIAGNOSIS — F17290 Nicotine dependence, other tobacco product, uncomplicated: Secondary | ICD-10-CM | POA: Diagnosis present

## 2016-09-05 DIAGNOSIS — Z91040 Latex allergy status: Secondary | ICD-10-CM

## 2016-09-05 DIAGNOSIS — E875 Hyperkalemia: Secondary | ICD-10-CM | POA: Diagnosis present

## 2016-09-05 DIAGNOSIS — I482 Chronic atrial fibrillation: Secondary | ICD-10-CM | POA: Diagnosis present

## 2016-09-05 DIAGNOSIS — Z96642 Presence of left artificial hip joint: Secondary | ICD-10-CM | POA: Diagnosis present

## 2016-09-05 DIAGNOSIS — I639 Cerebral infarction, unspecified: Secondary | ICD-10-CM

## 2016-09-05 DIAGNOSIS — Z791 Long term (current) use of non-steroidal anti-inflammatories (NSAID): Secondary | ICD-10-CM

## 2016-09-05 DIAGNOSIS — Z885 Allergy status to narcotic agent status: Secondary | ICD-10-CM

## 2016-09-05 DIAGNOSIS — Z7984 Long term (current) use of oral hypoglycemic drugs: Secondary | ICD-10-CM

## 2016-09-05 DIAGNOSIS — Z881 Allergy status to other antibiotic agents status: Secondary | ICD-10-CM

## 2016-09-05 DIAGNOSIS — D638 Anemia in other chronic diseases classified elsewhere: Secondary | ICD-10-CM | POA: Diagnosis present

## 2016-09-05 DIAGNOSIS — Z85118 Personal history of other malignant neoplasm of bronchus and lung: Secondary | ICD-10-CM

## 2016-09-05 DIAGNOSIS — Z79899 Other long term (current) drug therapy: Secondary | ICD-10-CM

## 2016-09-05 DIAGNOSIS — I252 Old myocardial infarction: Secondary | ICD-10-CM

## 2016-09-05 LAB — BLOOD GAS, VENOUS
Patient temperature: 37
pCO2, Ven: 48 mmHg (ref 44.0–60.0)
pH, Ven: 7.36 (ref 7.250–7.430)
pO2, Ven: 43 mmHg (ref 32.0–45.0)

## 2016-09-05 LAB — CBC WITH DIFFERENTIAL/PLATELET
BASOS PCT: 1 %
Basophils Absolute: 0 10*3/uL (ref 0–0.1)
Eosinophils Absolute: 0 10*3/uL (ref 0–0.7)
Eosinophils Relative: 0 %
HCT: 25.4 % — ABNORMAL LOW (ref 35.0–47.0)
Hemoglobin: 9 g/dL — ABNORMAL LOW (ref 12.0–16.0)
LYMPHS PCT: 7 %
Lymphs Abs: 0.7 10*3/uL — ABNORMAL LOW (ref 1.0–3.6)
MCH: 29.5 pg (ref 26.0–34.0)
MCHC: 35.2 g/dL (ref 32.0–36.0)
MCV: 83.7 fL (ref 80.0–100.0)
MONO ABS: 0.9 10*3/uL (ref 0.2–0.9)
MONOS PCT: 10 %
NEUTROS PCT: 82 %
Neutro Abs: 7.2 10*3/uL — ABNORMAL HIGH (ref 1.4–6.5)
Platelets: 319 10*3/uL (ref 150–440)
RBC: 3.04 MIL/uL — ABNORMAL LOW (ref 3.80–5.20)
RDW: 13.7 % (ref 11.5–14.5)
WBC: 8.8 10*3/uL (ref 3.6–11.0)

## 2016-09-05 LAB — COMPREHENSIVE METABOLIC PANEL
ALBUMIN: 4 g/dL (ref 3.5–5.0)
ALT: 17 U/L (ref 14–54)
AST: 29 U/L (ref 15–41)
Alkaline Phosphatase: 50 U/L (ref 38–126)
Anion gap: 9 (ref 5–15)
BUN: 47 mg/dL — ABNORMAL HIGH (ref 6–20)
CALCIUM: 9.2 mg/dL (ref 8.9–10.3)
CHLORIDE: 88 mmol/L — AB (ref 101–111)
CO2: 26 mmol/L (ref 22–32)
CREATININE: 2.65 mg/dL — AB (ref 0.44–1.00)
GFR, EST AFRICAN AMERICAN: 17 mL/min — AB (ref >60)
GFR, EST NON AFRICAN AMERICAN: 15 mL/min — AB (ref >60)
Glucose, Bld: 215 mg/dL — ABNORMAL HIGH (ref 65–99)
Potassium: 6.1 mmol/L — ABNORMAL HIGH (ref 3.5–5.1)
SODIUM: 123 mmol/L — AB (ref 135–145)
Total Bilirubin: 0.7 mg/dL (ref 0.3–1.2)
Total Protein: 7.5 g/dL (ref 6.5–8.1)

## 2016-09-05 LAB — TROPONIN I
TROPONIN I: 0.1 ng/mL — AB (ref <0.03)
Troponin I: 0.12 ng/mL (ref <0.03)
Troponin I: 0.12 ng/mL (ref <0.03)

## 2016-09-05 LAB — GLUCOSE, CAPILLARY
GLUCOSE-CAPILLARY: 194 mg/dL — AB (ref 65–99)
Glucose-Capillary: 192 mg/dL — ABNORMAL HIGH (ref 65–99)

## 2016-09-05 LAB — BRAIN NATRIURETIC PEPTIDE: B NATRIURETIC PEPTIDE 5: 980 pg/mL — AB (ref 0.0–100.0)

## 2016-09-05 LAB — LACTIC ACID, PLASMA: Lactic Acid, Venous: 1.3 mmol/L (ref 0.5–1.9)

## 2016-09-05 MED ORDER — SODIUM CHLORIDE 0.9 % IV SOLN
INTRAVENOUS | Status: DC
  Administered 2016-09-05: 1000 mL via INTRAVENOUS
  Administered 2016-09-06: 100 mL/h via INTRAVENOUS

## 2016-09-05 MED ORDER — CLOPIDOGREL BISULFATE 75 MG PO TABS
75.0000 mg | ORAL_TABLET | Freq: Every day | ORAL | Status: DC
  Administered 2016-09-05 – 2016-09-06 (×2): 75 mg via ORAL
  Filled 2016-09-05 (×2): qty 1

## 2016-09-05 MED ORDER — ROSUVASTATIN CALCIUM 10 MG PO TABS
20.0000 mg | ORAL_TABLET | Freq: Every evening | ORAL | Status: DC
  Administered 2016-09-05: 20 mg via ORAL
  Filled 2016-09-05: qty 2

## 2016-09-05 MED ORDER — PIPERACILLIN-TAZOBACTAM 3.375 G IVPB
3.3750 g | Freq: Once | INTRAVENOUS | Status: AC
  Administered 2016-09-05: 3.375 g via INTRAVENOUS
  Filled 2016-09-05: qty 50

## 2016-09-05 MED ORDER — TRAMADOL-ACETAMINOPHEN 37.5-325 MG PO TABS: 1.0000 | ORAL_TABLET | Freq: Four times a day (QID) | ORAL | Status: DC | PRN

## 2016-09-05 MED ORDER — SODIUM CHLORIDE 0.9% FLUSH
3.0000 mL | Freq: Two times a day (BID) | INTRAVENOUS | Status: DC
  Administered 2016-09-05: 3 mL via INTRAVENOUS

## 2016-09-05 MED ORDER — DEXTROSE 50 % IV SOLN
25.0000 g | Freq: Once | INTRAVENOUS | Status: AC
  Administered 2016-09-05: 25 g via INTRAVENOUS
  Filled 2016-09-05: qty 50

## 2016-09-05 MED ORDER — LEVOTHYROXINE SODIUM 25 MCG PO TABS
125.0000 ug | ORAL_TABLET | Freq: Every day | ORAL | Status: DC
  Administered 2016-09-06: 05:00:00 125 ug via ORAL
  Filled 2016-09-05: qty 1

## 2016-09-05 MED ORDER — GABAPENTIN 100 MG PO CAPS
100.0000 mg | ORAL_CAPSULE | Freq: Two times a day (BID) | ORAL | Status: DC
  Administered 2016-09-05 – 2016-09-06 (×2): 100 mg via ORAL
  Filled 2016-09-05 (×2): qty 1

## 2016-09-05 MED ORDER — INSULIN ASPART 100 UNIT/ML ~~LOC~~ SOLN: 0.0000 [IU] | Freq: Every day | SUBCUTANEOUS | Status: DC

## 2016-09-05 MED ORDER — SODIUM POLYSTYRENE SULFONATE 15 GM/60ML PO SUSP
30.0000 g | Freq: Once | ORAL | Status: AC
  Administered 2016-09-05: 30 g via ORAL
  Filled 2016-09-05: qty 120

## 2016-09-05 MED ORDER — HEPARIN SODIUM (PORCINE) 5000 UNIT/ML IJ SOLN
5000.0000 [IU] | Freq: Three times a day (TID) | INTRAMUSCULAR | Status: DC
  Administered 2016-09-05 – 2016-09-06 (×2): 5000 [IU] via SUBCUTANEOUS
  Filled 2016-09-05 (×3): qty 1

## 2016-09-05 MED ORDER — VITAMIN D 1000 UNITS PO TABS
5000.0000 [IU] | ORAL_TABLET | Freq: Every day | ORAL | Status: DC
  Administered 2016-09-06: 5000 [IU] via ORAL
  Filled 2016-09-05: qty 5

## 2016-09-05 MED ORDER — ALPRAZOLAM 0.25 MG PO TABS
0.2500 mg | ORAL_TABLET | Freq: Two times a day (BID) | ORAL | Status: DC | PRN
  Administered 2016-09-06: 0.25 mg via ORAL
  Filled 2016-09-05 (×2): qty 1

## 2016-09-05 MED ORDER — ASPIRIN EC 81 MG PO TBEC
81.0000 mg | DELAYED_RELEASE_TABLET | Freq: Every day | ORAL | Status: DC
  Administered 2016-09-05 – 2016-09-06 (×2): 81 mg via ORAL
  Filled 2016-09-05 (×2): qty 1

## 2016-09-05 MED ORDER — AMIODARONE HCL 200 MG PO TABS
200.0000 mg | ORAL_TABLET | Freq: Every day | ORAL | Status: DC
  Administered 2016-09-06: 200 mg via ORAL
  Filled 2016-09-05 (×2): qty 1

## 2016-09-05 MED ORDER — LEVALBUTEROL HCL 1.25 MG/0.5ML IN NEBU
1.2500 mg | INHALATION_SOLUTION | Freq: Four times a day (QID) | RESPIRATORY_TRACT | Status: DC | PRN
  Administered 2016-09-05 – 2016-09-06 (×2): 1.25 mg via RESPIRATORY_TRACT
  Filled 2016-09-05 (×3): qty 0.5

## 2016-09-05 MED ORDER — MONTELUKAST SODIUM 10 MG PO TABS
10.0000 mg | ORAL_TABLET | Freq: Every evening | ORAL | Status: DC
  Administered 2016-09-05: 10 mg via ORAL
  Filled 2016-09-05: qty 1

## 2016-09-05 MED ORDER — ACETAMINOPHEN 650 MG RE SUPP: 650.0000 mg | Freq: Four times a day (QID) | RECTAL | Status: DC | PRN

## 2016-09-05 MED ORDER — PIPERACILLIN-TAZOBACTAM 3.375 G IVPB
3.3750 g | Freq: Two times a day (BID) | INTRAVENOUS | Status: DC
  Administered 2016-09-06 (×2): 3.375 g via INTRAVENOUS
  Filled 2016-09-05 (×5): qty 50

## 2016-09-05 MED ORDER — VANCOMYCIN HCL 500 MG IV SOLR
500.0000 mg | Freq: Once | INTRAVENOUS | Status: AC
  Administered 2016-09-05: 500 mg via INTRAVENOUS
  Filled 2016-09-05: qty 500

## 2016-09-05 MED ORDER — ONDANSETRON HCL 4 MG PO TABS: 4.0000 mg | ORAL_TABLET | Freq: Four times a day (QID) | ORAL | Status: DC | PRN

## 2016-09-05 MED ORDER — MECLIZINE HCL 25 MG PO TABS
25.0000 mg | ORAL_TABLET | Freq: Three times a day (TID) | ORAL | Status: DC
Start: 2016-09-05 — End: 2016-09-06
  Administered 2016-09-05 – 2016-09-06 (×2): 25 mg via ORAL
  Filled 2016-09-05 (×3): qty 1

## 2016-09-05 MED ORDER — VANCOMYCIN HCL IN DEXTROSE 1-5 GM/200ML-% IV SOLN
1000.0000 mg | Freq: Once | INTRAVENOUS | Status: AC
  Administered 2016-09-05: 1000 mg via INTRAVENOUS
  Filled 2016-09-05: qty 200

## 2016-09-05 MED ORDER — ACETAMINOPHEN 325 MG PO TABS: 650.0000 mg | ORAL_TABLET | Freq: Four times a day (QID) | ORAL | Status: DC | PRN

## 2016-09-05 MED ORDER — INSULIN ASPART 100 UNIT/ML ~~LOC~~ SOLN
0.0000 [IU] | Freq: Three times a day (TID) | SUBCUTANEOUS | Status: DC
Start: 2016-09-05 — End: 2016-09-06
  Administered 2016-09-05: 2 [IU] via SUBCUTANEOUS
  Administered 2016-09-06 (×2): 3 [IU] via SUBCUTANEOUS
  Filled 2016-09-05: qty 3
  Filled 2016-09-05: qty 2
  Filled 2016-09-05: qty 3

## 2016-09-05 MED ORDER — ONDANSETRON HCL 4 MG/2ML IJ SOLN: 4.0000 mg | Freq: Four times a day (QID) | INTRAMUSCULAR | Status: DC | PRN

## 2016-09-05 MED ORDER — SUCRALFATE 1 G PO TABS
1.0000 g | ORAL_TABLET | Freq: Every day | ORAL | Status: DC
  Administered 2016-09-06: 1 g via ORAL
  Filled 2016-09-05: qty 1

## 2016-09-05 MED ORDER — INSULIN ASPART 100 UNIT/ML ~~LOC~~ SOLN
10.0000 [IU] | Freq: Once | SUBCUTANEOUS | Status: AC
  Administered 2016-09-05: 10 [IU] via INTRAVENOUS
  Filled 2016-09-05: qty 10

## 2016-09-05 MED ORDER — SODIUM CHLORIDE 0.9 % IV SOLN
Freq: Once | INTRAVENOUS | Status: AC
  Administered 2016-09-05: 1000 mL via INTRAVENOUS

## 2016-09-05 MED ORDER — PANTOPRAZOLE SODIUM 40 MG PO TBEC
40.0000 mg | DELAYED_RELEASE_TABLET | Freq: Every day | ORAL | Status: DC
  Administered 2016-09-05 – 2016-09-06 (×2): 40 mg via ORAL
  Filled 2016-09-05 (×2): qty 1

## 2016-09-05 NOTE — Progress Notes (Signed)
Pharmacy Antibiotic Note  [name] is a 83 y.o. female admitted on 09/05/2016 with sepsis.  Pharmacy has been consulted for Vancomycin and Zosyn  dosing.  Plan: Patient received Vancomycin 1 g IV x 1. Will give an additional 500 mg IV of Vancomycin to = 1500 mg of Vancomycin. Will hold off on starting regimen due to renal function likely improving. Will check serum creatinine with am labs and will then start Vancomycin based on PK parameters calculated.  Zosyn: Will start Zosyn 3.375 g IV q12 hours.    Height: '5\' 6"'$  (167.6 cm) Weight: 220 lb 7.4 oz (100 kg) IBW/kg (Calculated) : 59.3  Temp (24hrs), Avg:97.9 F (36.6 C), Min:97.9 F (36.6 C), Max:97.9 F (36.6 C)   Recent Labs Lab 09/05/16 0958 09/05/16 1023 09/05/16 1155  WBC 8.8  --   --   CREATININE  --   --  2.65*  LATICACIDVEN  --  1.3  --     Estimated Creatinine Clearance: 17.5 mL/min (A) (by C-G formula based on SCr of 2.65 mg/dL (H)).    Allergies  Allergen Reactions  . Bactrim [Sulfamethoxazole-Trimethoprim]     "Diabetic seizures"  . Darvon [Propoxyphene] Other (See Comments)    Patient states she was real weak, felt like she was going to pass out.  . Albuterol Palpitations and Other (See Comments)    Irregular heart beat, CAN TOLERATE XOPENEX  . Fentanyl Nausea And Vomiting and Rash  . Latex Rash  . Tetracyclines & Related Rash    Antimicrobials this admission: Vancomycin 4/9 >>  Zosyn  4/9  >>   Dose adjustments this admission:   Microbiology results: BCx: pending   UCx:  Pending   Sputum:    MRSA PCR:   Thank you for allowing pharmacy to be a part of this patient's care.  Floria Brandau D 09/05/2016 5:09 PM

## 2016-09-05 NOTE — ED Notes (Addendum)
Date and time results received: 09/05/16 1:30 PM (use smartphrase ".now" to insert current time)  Test: Troponin Critical Value: 0.10  Name of Provider Notified: Dr. Caprice Renshaw  Orders Received? Or Actions Taken?: Orders Received - See Orders for details

## 2016-09-05 NOTE — ED Triage Notes (Signed)
Pt in via EMS from home; family reports to EMS increased shortness of breath and generalized weakness over the last few days, having to increase home oxygen from 2L to 3L.  Pt alert to voice upon arrival.  Pt with labored breathing, sats 96% on 4L nasal cannula.  MD notified.

## 2016-09-05 NOTE — ED Provider Notes (Signed)
[facility] Emergency Department Provider Note       Time seen: ----------------------------------------- 9:47 AM on 09/05/2016 -----------------------------------------   L5 caveat: Review of systems history is limited by altered mental status.  I have reviewed the triage vital signs and the nursing notes.   HISTORY   Chief Complaint Chest Pain and Shortness of Breath    HPI [name] is a 83 y.o. female who presents to the ED for altered mental status and difficulty breathing. It is difficult to discern how long this been going on. Family reported that she has not been acting normally and she is having more trouble breathing. Patient states she does not feel well but cannot elaborate.   Past Medical History:  Diagnosis Date  . Arrhythmia   . Arthritis   . Asthma   . Atrial fibrillation ([place])   . Cancer ([place])    lung cancer  . COPD (chronic obstructive pulmonary disease) ([place])   . Coronary artery disease   . Depression   . Diabetes mellitus without complication ([place])   . Heart attack   . Heart disease   . Heart failure ([place])   . Heartburn   . HLD (hyperlipidemia)   . Hypertension   . Hyperthyroidism   . Kidney failure   . Sleep apnea   . Stroke [facility])     Patient Active Problem List   Diagnosis Date Noted  . Hyponatremia 08/16/2016  . Hyperkalemia 08/16/2016  . Renal insufficiency 08/16/2016  . Pernicious anemia 08/16/2016  . Orthostatic hypotension 08/15/2016  . Pre-syncope 08/12/2016  . Chest pain, rule out acute myocardial infarction 07/04/2016  . Recurrent UTI 12/29/2015  . Dysuria 12/29/2015  . Acute kidney injury ([place]) 06/27/2015  . Hypoglycemia 06/27/2015  . OBSTRUCTIVE SLEEP APNEA 03/15/2007  . CORONARY ARTERY DISEASE 03/15/2007  . ASTHMA, CHRONIC OBSTRUCTIVE NOS 03/15/2007  . FIBROMYALGIA 03/15/2007    Past Surgical History:  Procedure Laterality Date  . CHOLECYSTECTOMY  1983  . EYE SURGERY  2012/2016  .  THYROID SURGERY  1963  . TOTAL HIP ARTHROPLASTY Left 2008    Allergies Bactrim [sulfamethoxazole-trimethoprim]; Darvon [propoxyphene]; Albuterol; Fentanyl; Latex; and Tetracyclines & related  Social History Social History  Substance Use Topics  . Smoking status: Never Smoker  . Smokeless tobacco: Never Used  . Alcohol use No    Review of Systems Respiratory: Positive for trouble breathing Review of systems otherwise unknown. ____________________________________________   PHYSICAL EXAM:  VITAL SIGNS: ED Triage Vitals  Enc Vitals Group     BP      Pulse      Resp      Temp      Temp src      SpO2      Weight      Height      Head Circumference      Peak Flow      Pain Score      Pain Loc      Pain Edu?      Excl. in [place]?     Constitutional: Alert But disoriented, mild distress Eyes: Conjunctivae are normal. Normal extraocular movements. ENT   Head: Normocephalic and atraumatic.   Nose: No congestion/rhinnorhea.   Mouth/Throat: Mucous membranes are moist.   Neck: No stridor. Cardiovascular: Slow rate, regular rhythm. No murmurs, rubs, or gallops. Respiratory: Normal respiratory effort without tachypnea nor retractions. Breath sounds are clear and equal bilaterally. No wheezes/rales/rhonchi. Gastrointestinal: Distended, soft, Normal bowel sounds Musculoskeletal: Nontender with normal  range of motion in extremities. Lower extremity edema is noted Neurologic:  Generalized weakness, confusion is noted Skin:  Skin is warm, dry and intact. No rash noted. Psychiatric: Mood and affect are normal. Speech and behavior are normal.  ____________________________________________  EKG: Interpreted by me. Junctional rhythm with a rate of 52 bpm, wide QRS, long QT, right bundle branch block.  ____________________________________________  ED COURSE:  Pertinent labs & imaging results that were available during my care of the patient were reviewed by me and  considered in my medical decision making (see chart for details). Patient presents for altered mental status and shortness of breath, we will assess with labs and imaging as indicated.   Procedures ____________________________________________   LABS (pertinent positives/negatives)  Labs Reviewed  CBC WITH DIFFERENTIAL/PLATELET - Abnormal; Notable for the following:       Result Value   RBC 3.04 (*)    Hemoglobin 9.0 (*)    HCT 25.4 (*)    Neutro Abs 7.2 (*)    Lymphs Abs 0.7 (*)    All other components within normal limits  COMPREHENSIVE METABOLIC PANEL - Abnormal; Notable for the following:    Sodium 123 (*)    Potassium 6.1 (*)    Chloride 88 (*)    Glucose, Bld 215 (*)    BUN 47 (*)    Creatinine, Ser 2.65 (*)    GFR calc non Af Amer 15 (*)    GFR calc Af Amer 17 (*)    All other components within normal limits  CULTURE, BLOOD (ROUTINE X 2)  CULTURE, BLOOD (ROUTINE X 2)  URINE CULTURE  LACTIC ACID, PLASMA  BLOOD GAS, VENOUS  BRAIN NATRIURETIC PEPTIDE   CRITICAL CARE Performed by: Earleen Newport   Total critical care time: 30 minutes  Critical care time was exclusive of separately billable procedures and treating other patients.  Critical care was necessary to treat or prevent imminent or life-threatening deterioration.  Critical care was time spent personally by me on the following activities: development of treatment plan with patient and/or surrogate as well as nursing, discussions with consultants, evaluation of patient's response to treatment, examination of patient, obtaining history from patient or surrogate, ordering and performing treatments and interventions, ordering and review of laboratory studies, ordering and review of radiographic studies, pulse oximetry and re-evaluation of patient's condition.  RADIOLOGY Images were viewed by me  Chest x-ray  IMPRESSION: There are currently pleural effusions bilaterally with what appears to be loculation  of the effusion on the right. Consolidation in the left lower lobe may represent pneumonia in this area. There is radiation therapy fibrosis in the right perihilar region with peripheral scarring in the right mid lung. Stable cardiomegaly. There is aortic atherosclerosis.  Followup PA and lateral chest radiographs recommended in 3-4 weeks following trial of antibiotic therapy to ensure resolution and exclude underlying malignancy. ____________________________________________  FINAL ASSESSMENT AND PLAN  Altered mental status, dyspnea, junctional bradycardia, acute renal failure, hyperkalemia  Plan: Patient's labs and imaging were dictated above. Patient had presented for dyspnea and altered mental status. She has evidence of oral effusions and possible underlying pneumonia. She was started on sepsis protocols. She also has evidence of renal insufficiency with high potassium. She will receive IV fluids, insulin, D50, Kayexalate. I will discuss with the hospitalist for admission.   Earleen Newport, MD   Note: This note was generated in part or whole with voice recognition software. Voice recognition is usually quite accurate but there are transcription  errors that can and very often do occur. I apologize for any typographical errors that were not detected and corrected.     Earleen Newport, MD 09/05/16 1235

## 2016-09-05 NOTE — H&P (Signed)
[place] [facility] [place] NAME: [name]    MR#:  [id number]  DATE OF BIRTH:  10-09-1925  DATE OF ADMISSION:  09/05/2016  PRIMARY CARE PHYSICIAN: Lorelee Market, MD   REQUESTING/REFERRING PHYSICIAN: Dr. Lenise Arena  CHIEF COMPLAINT:   Chief Complaint  Patient presents with  . Chest Pain  . Shortness of Breath    HISTORY OF PRESENT ILLNESS:  [name]  is a 83 y.o. female with a known history of Lung cancer in remission, chronic atrial fibrillation, COPD, diabetes, hypertension, depression, hypothyroidism, obstructive sleep apnea, history of previous CVA who presents to the hospital but in by her family due to altered mental status shortness of breath. Patient herself is a poor historian presently and most of the history obtained from the daughter at bedside. As per the daughter patient has been more lethargic and weak over the past week. She is also been more and more confused over the past week much worse in the past day and a half. She also has been having significant shortness of breath on minimal exertion and also having significant weakness. She took her to her primary care physician's office who diagnosed her with a UTI and started her on oral ciprofloxacin which she is taking for the past 2-3 days. Despite being on the antibiotics her symptoms are not improving so she came to the ER for further evaluation. Patient was noted to be in acute kidney injury and also noted to be hyperkalemic with chest x-ray findings suggestive pneumonia. Hospitalist services were contacted further treatment and evaluation.  PAST MEDICAL HISTORY:   Past Medical History:  Diagnosis Date  . Arrhythmia   . Arthritis   . Asthma   . Atrial fibrillation ([place])   . Cancer ([place])    lung cancer  . COPD (chronic obstructive pulmonary disease) ([place])   . Coronary artery disease   . Depression   . Diabetes mellitus without complication ([place])   . Heart attack    . Heart disease   . Heart failure ([place])   . Heartburn   . HLD (hyperlipidemia)   . Hypertension   . Hyperthyroidism   . Kidney failure   . Sleep apnea   . Stroke [facility])     PAST SURGICAL HISTORY:   Past Surgical History:  Procedure Laterality Date  . CHOLECYSTECTOMY  1983  . EYE SURGERY  2012/2016  . THYROID SURGERY  1963  . TOTAL HIP ARTHROPLASTY Left 2008    SOCIAL HISTORY:   Social History  Substance Use Topics  . Smoking status: Never Smoker  . Smokeless tobacco: Current User    Types: Snuff  . Alcohol use No    FAMILY HISTORY:   Family History  Problem Relation Age of Onset  . Diabetes Other   . Hypertension Other   . Hematuria Father   . Kidney failure Father   . Kidney cancer Brother   . Kidney failure Mother   . Kidney Stones Brother     DRUG ALLERGIES:   Allergies  Allergen Reactions  . Bactrim [Sulfamethoxazole-Trimethoprim]     "Diabetic seizures"  . Darvon [Propoxyphene] Other (See Comments)    Patient states she was real weak, felt like she was going to pass out.  . Albuterol Palpitations and Other (See Comments)    Irregular heart beat, CAN TOLERATE XOPENEX  . Fentanyl Nausea And Vomiting and Rash  . Latex Rash  . Tetracyclines & Related Rash    REVIEW  OF SYSTEMS:   Review of Systems  Constitutional: Positive for malaise/fatigue. Negative for fever and weight loss.  HENT: Negative for congestion, nosebleeds and tinnitus.   Eyes: Negative for blurred vision, double vision and redness.  Respiratory: Positive for cough, sputum production and shortness of breath. Negative for hemoptysis.   Cardiovascular: Negative for chest pain, orthopnea, leg swelling and PND.  Gastrointestinal: Negative for abdominal pain, diarrhea, melena, nausea and vomiting.  Genitourinary: Negative for dysuria, hematuria and urgency.  Musculoskeletal: Negative for falls and joint pain.  Neurological: Positive for weakness. Negative for dizziness, tingling,  sensory change, focal weakness, seizures and headaches.  Endo/Heme/Allergies: Negative for polydipsia. Does not bruise/bleed easily.  Psychiatric/Behavioral: Negative for depression and memory loss. The patient is not nervous/anxious.     MEDICATIONS AT HOME:   Prior to Admission medications   Medication Sig Start Date End Date Taking? Authorizing Provider  ALPRAZolam (XANAX) 0.25 MG tablet Take 1 tablet (0.25 mg total) by mouth 2 (two) times daily as needed for anxiety. 07/06/16  Yes Vaughan Basta, MD  amiodarone (PACERONE) 200 MG tablet Take 200-800 mg by mouth daily. Take 3 tabs daily x 5 days, then take 2 tabs daily x 5 day, then take 1 tab daily 08/23/16  Yes Historical Provider, MD  amLODipine (NORVASC) 10 MG tablet Take 1 tablet (10 mg total) by mouth daily. Patient taking differently: Take 10 mg by mouth daily. At 12 pm 08/17/16  Yes Theodoro Grist, MD  aspirin EC 81 MG EC tablet Take 1 tablet (81 mg total) by mouth daily. Patient taking differently: Take 81 mg by mouth daily. At 12 pm 07/07/16  Yes Vaughan Basta, MD  Cholecalciferol (VITAMIN D3) 5000 units TABS Take 5,000 Units by mouth daily. At 12 pm   Yes Historical Provider, MD  ciprofloxacin (CIPRO) 250 MG tablet Take 250 mg by mouth 2 (two) times daily. For 7 days 09/02/16  Yes Historical Provider, MD  clopidogrel (PLAVIX) 75 MG tablet Take 1 tablet (75 mg total) by mouth daily. Patient taking differently: Take 75 mg by mouth daily. At 12 pm 07/07/16  Yes Vaughan Basta, MD  gabapentin (NEURONTIN) 100 MG capsule Take 100 mg by mouth 2 (two) times daily. 05/26/15  Yes Historical Provider, MD  glipiZIDE (GLUCOTROL XL) 10 MG 24 hr tablet Take 10 mg by mouth daily. At 12 pm   Yes Historical Provider, MD  iron polysaccharides (NIFEREX) 150 MG capsule Take 1 capsule (150 mg total) by mouth daily. Patient taking differently: Take 150 mg by mouth every other day. At bedtime 07/07/16  Yes Vaughan Basta, MD  isosorbide  mononitrate (IMDUR) 60 MG 24 hr tablet Take 60 mg by mouth 2 (two) times daily. 05/26/15  Yes Historical Provider, MD  levothyroxine (SYNTHROID, LEVOTHROID) 125 MCG tablet Take 125 mcg by mouth daily before breakfast. 06/09/15  Yes Historical Provider, MD  meclizine (ANTIVERT) 25 MG tablet Take 25 mg by mouth 3 (three) times daily.   Yes Historical Provider, MD  meloxicam (MOBIC) 7.5 MG tablet Take 7.5 mg by mouth every other day. 05/18/15  Yes Historical Provider, MD  montelukast (SINGULAIR) 10 MG tablet Take 10 mg by mouth every evening.    Yes Historical Provider, MD  nitroGLYCERIN (NITROSTAT) 0.4 MG SL tablet Place 1 tablet (0.4 mg total) under the tongue every 5 (five) minutes as needed for chest pain. 07/06/16  Yes Vaughan Basta, MD  pantoprazole (PROTONIX) 40 MG tablet Take 1 tablet (40 mg total) by mouth daily. 07/07/16  Yes Vaughan Basta, MD  rosuvastatin (CRESTOR) 20 MG tablet Take 20 mg by mouth every evening.    Yes Historical Provider, MD  sucralfate (CARAFATE) 1 g tablet Take 1 g by mouth daily before breakfast.   Yes Historical Provider, MD  traMADol-acetaminophen (ULTRACET) 37.5-325 MG tablet Take 1 tablet by mouth every 6 (six) hours as needed for moderate pain.    Yes Historical Provider, MD      VITAL SIGNS:  Blood pressure (!) 164/58, pulse (!) 52, temperature 97.9 F (36.6 C), temperature source Oral, resp. rate 20, height '5\' 6"'$  (1.676 m), weight 100 kg (220 lb 7.4 oz), SpO2 96 %.  PHYSICAL EXAMINATION:  Physical Exam  GENERAL:  83 y.o.-year-old patient lying in bed in mild resp. distress.  EYES: Pupils equal, round, reactive to light and accommodation. No scleral icterus. Extraocular muscles intact.  HEENT: Head atraumatic, normocephalic. Oropharynx and nasopharynx clear. No oropharyngeal erythema, moist oral mucosa  NECK:  Supple, no jugular venous distention. No thyroid enlargement, no tenderness.  LUNGS: Normal breath sounds bilaterally, no wheezing,  bibasilar rales, No rhonchi. No use of accessory muscles of respiration.  CARDIOVASCULAR: S1, S2 RRR. No murmurs, rubs, gallops, clicks.  ABDOMEN: Soft, nontender, nondistended. Bowel sounds present. No organomegaly or mass.  EXTREMITIES: No pedal edema, cyanosis, or clubbing. + 2 pedal & radial pulses b/l.   NEUROLOGIC: Cranial nerves II through XII are intact. No focal Motor or sensory deficits appreciated b/l. Globally weak.  PSYCHIATRIC: The patient is alert and oriented x 2.   SKIN: No obvious rash, lesion, or ulcer.   LABORATORY PANEL:   CBC  Recent Labs Lab 09/05/16 0958  WBC 8.8  HGB 9.0*  HCT 25.4*  PLT 319   ------------------------------------------------------------------------------------------------------------------  Chemistries   Recent Labs Lab 09/05/16 1155  NA 123*  K 6.1*  CL 88*  CO2 26  GLUCOSE 215*  BUN 47*  CREATININE 2.65*  CALCIUM 9.2  AST 29  ALT 17  ALKPHOS 50  BILITOT 0.7   ------------------------------------------------------------------------------------------------------------------  Cardiac Enzymes  Recent Labs Lab 09/05/16 1155  TROPONINI 0.10*   ------------------------------------------------------------------------------------------------------------------  RADIOLOGY:  Dg Chest Port 1 View  Result Date: 09/05/2016 CLINICAL DATA:  Shortness of breath.  History of lung carcinoma EXAM: PORTABLE CHEST 1 VIEW COMPARISON:  September 12, 2016 FINDINGS: There is persistent radiation therapy change with fibrosis in the right perihilar region with peripheral scarring. There is a new partially loculated right pleural effusion. On the left, there is airspace consolidation with effusion. There is cardiomegaly. Pulmonary vascularity is stable, within normal limits on the left and showing evidence of distortion on the right, likely due to previous radiation therapy. There is no adenopathy evident by radiography. Port-A-Cath tip is in the  superior vena cava. No pneumothorax. There is aortic atherosclerosis. No evident bone lesions. IMPRESSION: There are currently pleural effusions bilaterally with what appears to be loculation of the effusion on the right. Consolidation in the left lower lobe may represent pneumonia in this area. There is radiation therapy fibrosis in the right perihilar region with peripheral scarring in the right mid lung. Stable cardiomegaly. There is aortic atherosclerosis. Followup PA and lateral chest radiographs recommended in 3-4 weeks following trial of antibiotic therapy to ensure resolution and exclude underlying malignancy. Electronically Signed   By: Lowella Grip III M.D.   On: 09/05/2016 09:59     IMPRESSION AND PLAN:   83 year old female with past medical history of COPD, diabetes, hypertension, hypothyroidism, history of previous CVA, history  of lung cancer, who presents to the hospital due to altered mental status, shortness of breath and noted to be in acute kidney injury with hyperkalemia and also noted to have a pneumonia.  1. Sepsis-suspected to be secondary to pneumonia as noted in the chest x-ray. -We'll treat with broad-spectrum IV antibiotics with vancomycin, Zosyn and follow blood, sputum cultures. -As per the family patient was also told she had a UTI but we don't have UA here yet and will await UA  2. Altered Mental status-metabolic encephalopathy secondary to sepsis, hyponatremia acute kidney injury. -Continue treat underlying sepsis with IV antibiotics, give IV fluids for the acute kidney injury and hyponatremia. Follow mental status.  3. Acute kidney injury-secondary to dehydration and poor by mouth intake. Next-hydrate with IV fluids, follow BUN and creatinine. Renal dose meds avoid nephrotoxins.  4. Hyperkalemia-secondary to the acute kidney injury. Patient has been given insulin, D50, Kayexalate. -No acute EKG changes, follow potassium.  5. Pneumonia-suspected to be source of  patient's sepsis. -Continue broad-spectrum IV antibiotics with vancomycin and Zosyn and narrow once cultures are known.  6. Neuropathy-continue gabapentin.  7. Diabetes type 2 without complication-Place on sliding scale insulin.  8. Hypothyroidism-continue Synthroid.  9. Essential hypertension-hold antihypertensives for now, and will resume when hemodynamics improved.  2. Hyperlipidemia-continue Crestor.  11. GERD-continue Protonix.    All the records are reviewed and case discussed with ED provider. Management plans discussed with the patient, family and they are in agreement.  CODE STATUS: DNR  TOTAL TIME TAKING CARE OF THIS PATIENT: 45 minutes.    Henreitta Leber M.D on 09/05/2016 at 2:08 PM  Between 7am to 6pm - Pager - [phone]  After 6pm go to [email or web address] - password [email or web address] [facility]  Office  [phone]  CC: Primary care physician; Lorelee Market, MD

## 2016-09-06 ENCOUNTER — Inpatient Hospital Stay: Payer: Medicare Other

## 2016-09-06 DIAGNOSIS — N179 Acute kidney failure, unspecified: Secondary | ICD-10-CM

## 2016-09-06 DIAGNOSIS — Z515 Encounter for palliative care: Secondary | ICD-10-CM

## 2016-09-06 DIAGNOSIS — R0602 Shortness of breath: Secondary | ICD-10-CM

## 2016-09-06 DIAGNOSIS — R4781 Slurred speech: Secondary | ICD-10-CM

## 2016-09-06 DIAGNOSIS — Z7189 Other specified counseling: Secondary | ICD-10-CM

## 2016-09-06 LAB — URINALYSIS, COMPLETE (UACMP) WITH MICROSCOPIC
BACTERIA UA: NONE SEEN
Bilirubin Urine: NEGATIVE
GLUCOSE, UA: NEGATIVE mg/dL
KETONES UR: NEGATIVE mg/dL
LEUKOCYTES UA: NEGATIVE
NITRITE: NEGATIVE
PROTEIN: 100 mg/dL — AB
Specific Gravity, Urine: 1.019 (ref 1.005–1.030)
pH: 5 (ref 5.0–8.0)

## 2016-09-06 LAB — BASIC METABOLIC PANEL
Anion gap: 9 (ref 5–15)
BUN: 45 mg/dL — AB (ref 6–20)
CHLORIDE: 89 mmol/L — AB (ref 101–111)
CO2: 24 mmol/L (ref 22–32)
CREATININE: 2.25 mg/dL — AB (ref 0.44–1.00)
Calcium: 8.7 mg/dL — ABNORMAL LOW (ref 8.9–10.3)
GFR calc Af Amer: 21 mL/min — ABNORMAL LOW (ref >60)
GFR calc non Af Amer: 18 mL/min — ABNORMAL LOW (ref >60)
Glucose, Bld: 213 mg/dL — ABNORMAL HIGH (ref 65–99)
POTASSIUM: 5.8 mmol/L — AB (ref 3.5–5.1)
Sodium: 122 mmol/L — ABNORMAL LOW (ref 135–145)

## 2016-09-06 LAB — CBC
HEMATOCRIT: 25.2 % — AB (ref 35.0–47.0)
Hemoglobin: 8.6 g/dL — ABNORMAL LOW (ref 12.0–16.0)
MCH: 28.9 pg (ref 26.0–34.0)
MCHC: 33.9 g/dL (ref 32.0–36.0)
MCV: 85.3 fL (ref 80.0–100.0)
PLATELETS: 330 10*3/uL (ref 150–440)
RBC: 2.96 MIL/uL — ABNORMAL LOW (ref 3.80–5.20)
RDW: 13.5 % (ref 11.5–14.5)
WBC: 8.4 10*3/uL (ref 3.6–11.0)

## 2016-09-06 LAB — GLUCOSE, CAPILLARY
GLUCOSE-CAPILLARY: 223 mg/dL — AB (ref 65–99)
Glucose-Capillary: 208 mg/dL — ABNORMAL HIGH (ref 65–99)

## 2016-09-06 LAB — TROPONIN I: Troponin I: 0.12 ng/mL (ref <0.03)

## 2016-09-06 MED ORDER — METHYLPREDNISOLONE SODIUM SUCC 40 MG IJ SOLR
40.0000 mg | Freq: Two times a day (BID) | INTRAMUSCULAR | Status: DC
  Administered 2016-09-06: 40 mg via INTRAVENOUS
  Filled 2016-09-06: qty 1

## 2016-09-06 MED ORDER — LEVALBUTEROL HCL 1.25 MG/0.5ML IN NEBU
1.2500 mg | INHALATION_SOLUTION | Freq: Four times a day (QID) | RESPIRATORY_TRACT | Status: DC
  Administered 2016-09-06: 1.25 mg via RESPIRATORY_TRACT

## 2016-09-06 MED ORDER — BIOTENE DRY MOUTH MT LIQD: 15.0000 mL | OROMUCOSAL | Status: DC | PRN

## 2016-09-06 MED ORDER — VANCOMYCIN HCL IN DEXTROSE 1-5 GM/200ML-% IV SOLN
1000.0000 mg | INTRAVENOUS | Status: DC
  Filled 2016-09-06: qty 200

## 2016-09-06 MED ORDER — GLYCOPYRROLATE 0.2 MG/ML IJ SOLN
0.2000 mg | INTRAMUSCULAR | Status: DC | PRN
  Filled 2016-09-06: qty 1

## 2016-09-06 MED ORDER — LEVALBUTEROL HCL 1.25 MG/0.5ML IN NEBU: 1.2500 mg | INHALATION_SOLUTION | Freq: Four times a day (QID) | RESPIRATORY_TRACT | Status: DC

## 2016-09-06 MED ORDER — POLYVINYL ALCOHOL 1.4 % OP SOLN
1.0000 [drp] | Freq: Four times a day (QID) | OPHTHALMIC | Status: DC | PRN
  Filled 2016-09-06: qty 15

## 2016-09-06 MED ORDER — SODIUM POLYSTYRENE SULFONATE 15 GM/60ML PO SUSP
30.0000 g | Freq: Once | ORAL | Status: DC
  Filled 2016-09-06: qty 120

## 2016-09-06 MED ORDER — SODIUM CHLORIDE 0.9 % IV BOLUS (SEPSIS)
500.0000 mL | Freq: Once | INTRAVENOUS | Status: AC
  Administered 2016-09-06: 500 mL via INTRAVENOUS

## 2016-09-06 MED ORDER — LORAZEPAM 2 MG/ML IJ SOLN
0.5000 mg | INTRAMUSCULAR | Status: DC | PRN
  Administered 2016-09-06 – 2016-09-07 (×2): 1 mg via INTRAVENOUS
  Filled 2016-09-06 (×2): qty 1

## 2016-09-06 MED ORDER — FENTANYL CITRATE (PF) 100 MCG/2ML IJ SOLN: 50.0000 ug | INTRAMUSCULAR | Status: DC | PRN

## 2016-09-06 NOTE — Consult Note (Signed)
[facility]  CONSULT NOTE    Date: 09/06/2016                  Patient Name:  [name]  MRN: [id number]  DOB: [birth year]  Age / Sex: 83 y.o., female         PCP: Lorelee Market, MD                 Service Requesting Consult: Dr. Tressia Miners                 Reason for Consult: Acute Renal Failure            History of Present Illness: [name] is a 83 y.o.  female with COPD, diabetes mellitus type II, hypothyroidism, obstructive sleep apnea, CVA, history of lung cancer, who was admitted to [facility] on 09/05/2016 for Shortness of breath [R06.02] Pleural effusion [J90] Acute renal failure, unspecified acute renal failure type ([place]) [N17.9] Community acquired pneumonia of left lower lobe of lung ([place]) [J18.1]   [name] are at bedside.   Patient was admitted to [facility] from 3/16 to 3/20 for syncope and found to have a junctional rhythm.  Patient confused and unable to give much of a history. Patient did not get IV contrast. Admitted with hypotension. Creatinine on last admission 0.91. On admission creatinine 2.65 and Potassium 6.1, sodium 122.   Medications: Outpatient medications: Prescriptions Prior to Admission  Medication Sig Dispense Refill Last Dose  . ALPRAZolam (XANAX) 0.25 MG tablet Take 1 tablet (0.25 mg total) by mouth 2 (two) times daily as needed for anxiety. 20 tablet 0 PRN at PRN  . amiodarone (PACERONE) 200 MG tablet Take 200-800 mg by mouth daily. Take 3 tabs daily x 5 days, then take 2 tabs daily x 5 day, then take 1 tab daily   09/04/2016 at 1130  . amLODipine (NORVASC) 10 MG tablet Take 1 tablet (10 mg total) by mouth daily. (Patient taking differently: Take 10 mg by mouth daily. At 12 pm) 30 tablet 3 09/04/2016 at pm  . aspirin EC 81 MG EC tablet Take 1 tablet (81 mg total) by mouth daily. (Patient taking differently: Take 81 mg by mouth daily. At 12 pm) 30 tablet 0 09/04/2016 at 1200  . Cholecalciferol (VITAMIN D3) 5000 units  TABS Take 5,000 Units by mouth daily. At 12 pm   09/04/2016 at pm  . ciprofloxacin (CIPRO) 250 MG tablet Take 250 mg by mouth 2 (two) times daily. For 7 days   09/04/2016 at pm  . clopidogrel (PLAVIX) 75 MG tablet Take 1 tablet (75 mg total) by mouth daily. (Patient taking differently: Take 75 mg by mouth daily. At 12 pm) 30 tablet 0 09/04/2016 at 1200  . gabapentin (NEURONTIN) 100 MG capsule Take 100 mg by mouth 2 (two) times daily.   09/04/2016 at pm  . glipiZIDE (GLUCOTROL XL) 10 MG 24 hr tablet Take 10 mg by mouth daily. At 12 pm   09/04/2016 at pm  . iron polysaccharides (NIFEREX) 150 MG capsule Take 1 capsule (150 mg total) by mouth daily. (Patient taking differently: Take 150 mg by mouth every other day. At bedtime) 30 capsule 0 09/04/2016 at pm  . isosorbide mononitrate (IMDUR) 60 MG 24 hr tablet Take 60 mg by mouth 2 (two) times daily.   09/04/2016 at 2100  . levothyroxine (SYNTHROID, LEVOTHROID) 125 MCG tablet Take 125 mcg by mouth daily before breakfast.   09/04/2016 at am  .  meclizine (ANTIVERT) 25 MG tablet Take 25 mg by mouth 3 (three) times daily.   09/04/2016 at pm  . meloxicam (MOBIC) 7.5 MG tablet Take 7.5 mg by mouth every other day.   09/04/2016 at am  . montelukast (SINGULAIR) 10 MG tablet Take 10 mg by mouth every evening.    09/04/2016 at pm  . nitroGLYCERIN (NITROSTAT) 0.4 MG SL tablet Place 1 tablet (0.4 mg total) under the tongue every 5 (five) minutes as needed for chest pain. 10 tablet 12 PRN at PRN  . pantoprazole (PROTONIX) 40 MG tablet Take 1 tablet (40 mg total) by mouth daily. 30 tablet 0 09/04/2016 at am  . rosuvastatin (CRESTOR) 20 MG tablet Take 20 mg by mouth every evening.    09/04/2016 at pm  . sucralfate (CARAFATE) 1 g tablet Take 1 g by mouth daily before breakfast.   09/04/2016 at am  . traMADol-acetaminophen (ULTRACET) 37.5-325 MG tablet Take 1 tablet by mouth every 6 (six) hours as needed for moderate pain.    PRN at PRN    Current medications: Current Facility-Administered  Medications  Medication Dose Route Frequency Provider Last Rate Last Dose  . 0.9 %  sodium chloride infusion   Intravenous Continuous Henreitta Leber, MD 100 mL/hr at 09/06/16 0555 100 mL/hr at 09/06/16 0555  . acetaminophen (TYLENOL) tablet 650 mg  650 mg Oral Q6H PRN Henreitta Leber, MD       Or  . acetaminophen (TYLENOL) suppository 650 mg  650 mg Rectal Q6H PRN Henreitta Leber, MD      . ALPRAZolam Duanne Moron) tablet 0.25 mg  0.25 mg Oral BID PRN Henreitta Leber, MD   0.25 mg at 09/06/16 0015  . amiodarone (PACERONE) tablet 200 mg  200 mg Oral Daily Henreitta Leber, MD   200 mg at 09/06/16 0846  . aspirin EC tablet 81 mg  81 mg Oral Daily Henreitta Leber, MD   81 mg at 09/06/16 0847  . cholecalciferol (VITAMIN D) tablet 5,000 Units  5,000 Units Oral Daily Henreitta Leber, MD   5,000 Units at 09/06/16 0846  . clopidogrel (PLAVIX) tablet 75 mg  75 mg Oral Daily Henreitta Leber, MD   75 mg at 09/06/16 1200  . gabapentin (NEURONTIN) capsule 100 mg  100 mg Oral BID Henreitta Leber, MD   100 mg at 09/06/16 0846  . heparin injection 5,000 Units  5,000 Units Subcutaneous Q8H Henreitta Leber, MD   5,000 Units at 09/06/16 0518  . insulin aspart (novoLOG) injection 0-5 Units  0-5 Units Subcutaneous QHS Henreitta Leber, MD      . insulin aspart (novoLOG) injection 0-9 Units  0-9 Units Subcutaneous TID WC Henreitta Leber, MD   3 Units at 09/06/16 1149  . levalbuterol (XOPENEX) nebulizer solution 1.25 mg  1.25 mg Nebulization Q6H Gladstone Lighter, MD      . levothyroxine (SYNTHROID, LEVOTHROID) tablet 125 mcg  125 mcg Oral QAC breakfast Henreitta Leber, MD   125 mcg at 09/06/16 0519  . meclizine (ANTIVERT) tablet 25 mg  25 mg Oral TID Henreitta Leber, MD   25 mg at 09/06/16 0846  . methylPREDNISolone sodium succinate (SOLU-MEDROL) 40 mg/mL injection 40 mg  40 mg Intravenous Q12H Gladstone Lighter, MD   40 mg at 09/06/16 1128  . montelukast (SINGULAIR) tablet 10 mg  10 mg Oral QPM Henreitta Leber, MD   10 mg at  09/05/16 1742  . ondansetron (ZOFRAN)  tablet 4 mg  4 mg Oral Q6H PRN Henreitta Leber, MD       Or  . ondansetron (ZOFRAN) injection 4 mg  4 mg Intravenous Q6H PRN Henreitta Leber, MD      . pantoprazole (PROTONIX) EC tablet 40 mg  40 mg Oral Daily Henreitta Leber, MD   40 mg at 09/06/16 0846  . piperacillin-tazobactam (ZOSYN) IVPB 3.375 g  3.375 g Intravenous Q12H Henreitta Leber, MD   3.375 g at 09/06/16 1129  . rosuvastatin (CRESTOR) tablet 20 mg  20 mg Oral QPM Henreitta Leber, MD   20 mg at 09/05/16 1742  . sodium chloride flush (NS) 0.9 % injection 3 mL  3 mL Intravenous Q12H Henreitta Leber, MD   3 mL at 09/05/16 2138  . sodium polystyrene (KAYEXALATE) 15 GM/60ML suspension 30 g  30 g Oral Once Gladstone Lighter, MD      . sucralfate (CARAFATE) tablet 1 g  1 g Oral QAC breakfast Henreitta Leber, MD   1 g at 09/06/16 0845  . traMADol-acetaminophen (ULTRACET) 37.5-325 MG per tablet 1 tablet  1 tablet Oral Q6H PRN Henreitta Leber, MD      . vancomycin (VANCOCIN) IVPB 1000 mg/200 mL premix  1,000 mg Intravenous Q36H Napoleon Form, RPH          Allergies: Allergies  Allergen Reactions  . Bactrim [Sulfamethoxazole-Trimethoprim]     "Diabetic seizures"  . Darvon [Propoxyphene] Other (See Comments)    Patient states she was real weak, felt like she was going to pass out.  . Albuterol Palpitations and Other (See Comments)    Irregular heart beat, CAN TOLERATE XOPENEX  . Fentanyl Nausea And Vomiting and Rash  . Latex Rash  . Tetracyclines & Related Rash      Past Medical History: Past Medical History:  Diagnosis Date  . Arrhythmia   . Arthritis   . Asthma   . Atrial fibrillation ([place])   . Cancer ([place])    lung cancer  . COPD (chronic obstructive pulmonary disease) ([place])   . Coronary artery disease   . Depression   . Diabetes mellitus without complication ([place])   . Heart attack   . Heart disease   . Heart failure ([place])   . Heartburn   . HLD (hyperlipidemia)   .  Hypertension   . Hyperthyroidism   . Kidney failure   . Sleep apnea   . Stroke [facility])      Past Surgical History: Past Surgical History:  Procedure Laterality Date  . CHOLECYSTECTOMY  1983  . EYE SURGERY  2012/2016  . THYROID SURGERY  1963  . TOTAL HIP ARTHROPLASTY Left 2008     Family History: Family History  Problem Relation Age of Onset  . Diabetes Other   . Hypertension Other   . Hematuria Father   . Kidney failure Father   . Kidney cancer Brother   . Kidney failure Mother   . Kidney Stones Brother      Social History: Social History   Social History  . Marital status: Widowed    Spouse name: N/A  . Number of children: N/A  . Years of education: N/A   Occupational History  . Not on file.   Social History Main Topics  . Smoking status: Never Smoker  . Smokeless tobacco: Current User    Types: Snuff  . Alcohol use No  . Drug use: No  . Sexual activity: Not on  file   Other Topics Concern  . Not on file   Social History Narrative  . No narrative on file     Review of Systems: ROS  Vital Signs: Blood pressure (!) 153/55, pulse (!) 51, temperature 98.1 F (36.7 C), temperature source Oral, resp. rate (!) 22, height '5\' 6"'$  (1.676 m), weight 94.7 kg (208 lb 11.2 oz), SpO2 96 %.  Weight trends: Filed Weights   09/05/16 0951 09/05/16 1700  Weight: 100 kg (220 lb 7.4 oz) 94.7 kg (208 lb 11.2 oz)    Physical Exam: General: NAD, laying in bed  Head: Normocephalic, atraumatic. Moist oral mucosal membranes  Eyes: Anicteric, PERRL  Neck: Supple, trachea midline  Lungs:  Clear to auscultation  Heart: Regular rate and rhythm  Abdomen:  Soft, nontender,   Extremities: No peripheral edema.  Neurologic: Nonfocal, moving all four extremities  Skin: No lesions        Lab results: Basic Metabolic Panel:  Recent Labs Lab 09/05/16 1155 09/06/16 0836  NA 123* 122*  K 6.1* 5.8*  CL 88* 89*  CO2 26 24  GLUCOSE 215* 213*  BUN 47* 45*  CREATININE  2.65* 2.25*  CALCIUM 9.2 8.7*    Liver Function Tests:  Recent Labs Lab 09/05/16 1155  AST 29  ALT 17  ALKPHOS 50  BILITOT 0.7  PROT 7.5  ALBUMIN 4.0   No results for input(s): LIPASE, AMYLASE in the last 168 hours. No results for input(s): AMMONIA in the last 168 hours.  CBC:  Recent Labs Lab 09/05/16 0958 09/06/16 0836  WBC 8.8 8.4  NEUTROABS 7.2*  --   HGB 9.0* 8.6*  HCT 25.4* 25.2*  MCV 83.7 85.3  PLT 319 330    Cardiac Enzymes:  Recent Labs Lab 09/05/16 1155 09/05/16 1858 09/05/16 2301 09/06/16 0836  TROPONINI 0.10* 0.12* 0.12* 0.12*    BNP: Invalid input(s): POCBNP  CBG:  Recent Labs Lab 09/05/16 1820 09/05/16 2048 09/06/16 0744 09/06/16 1131  GLUCAP 194* 192* 208* 223*    Microbiology: Results for orders placed or performed during the hospital encounter of 09/05/16  Blood Culture (routine x 2)     Status: None (Preliminary result)   Collection Time: 09/05/16 10:23 AM  Result Value Ref Range Status   Specimen Description BLOOD LEFT ARM  Final   Special Requests   Final    BOTTLES DRAWN AEROBIC AND ANAEROBIC Blood Culture results may not be optimal due to an excessive volume of blood received in culture bottles   Culture NO GROWTH < 24 HOURS  Final   Report Status PENDING  Incomplete  Blood Culture (routine x 2)     Status: None (Preliminary result)   Collection Time: 09/05/16 10:28 AM  Result Value Ref Range Status   Specimen Description BLOOD RIGHT HAND  Final   Special Requests   Final    BOTTLES DRAWN AEROBIC AND ANAEROBIC Blood Culture adequate volume   Culture NO GROWTH < 24 HOURS  Final   Report Status PENDING  Incomplete    Coagulation Studies: No results for input(s): LABPROT, INR in the last 72 hours.  Urinalysis: No results for input(s): COLORURINE, LABSPEC, PHURINE, GLUCOSEU, HGBUR, BILIRUBINUR, KETONESUR, PROTEINUR, UROBILINOGEN, NITRITE, LEUKOCYTESUR in the last 72 hours.  Invalid input(s): APPERANCEUR     Imaging: Ct Head Wo Contrast  Result Date: 09/06/2016 CLINICAL DATA:  Altered mental status and difficulty breathing. Slurred speech. EXAM: CT HEAD WITHOUT CONTRAST TECHNIQUE: Contiguous axial images were obtained from the base of  the skull through the vertex without intravenous contrast. COMPARISON:  08/14/2016.  12/10/2013 FINDINGS: Brain: Hypodensity in the left cerebellar hemisphere and near the left cerebellar peduncle. This appears to be new from the exam in 2015. Findings raise concern for age-indeterminate infarct. This could be acute or subacute. No evidence for acute hemorrhage, mass lesion, midline shift or hydrocephalus. Hypodensities in the white matter probably represent chronic changes. Vascular: No hyperdense vessel or unexpected calcification. Skull: No acute bone abnormality. Sinuses/Orbits: Small amount of disease in the right sphenoid sinus. The other visualized paranasal sinuses are clear. Mastoid air cells are clear. Other: None IMPRESSION: Hypodense areas in the left cerebellar hemisphere could represent an acute or subacute infarct. This area could be better characterized with MRI. No acute hemorrhage. These results were called by telephone at the time of interpretation on 09/06/2016 at 11:20 am to the Debbie nurse, Yves Dill, who verbally acknowledged these results. Electronically Signed   By: Markus Daft M.D.   On: 09/06/2016 11:23   US Renal  Result Date: 09/06/2016 CLINICAL DATA:  Acute renal failure EXAM: RENAL / URINARY TRACT ULTRASOUND COMPLETE COMPARISON:  Renal ultrasound of October 29, 2015 FINDINGS: Right Kidney: Length: 10.9 cm. The renal cortical echotexture remains lower than that of the adjacent liver. There is an upper pole cyst measuring 2.5 x 2.6 x 2.6 cm. There is no hydronephrosis. Left Kidney: Length: 11.4 cm. The renal cortical echotexture is similar to that on the right. There is no focal mass or hydronephrosis. Bladder: Urinary bladder is decompressed  with a Foley catheter. IMPRESSION: Normal renal ultrasound examination.  There is no hydronephrosis. Electronically Signed   By: David  Martinique M.D.   On: 09/06/2016 11:11   Dg Chest Port 1 View  Result Date: 09/05/2016 CLINICAL DATA:  Shortness of breath.  History of lung carcinoma EXAM: PORTABLE CHEST 1 VIEW COMPARISON:  September 12, 2016 FINDINGS: There is persistent radiation therapy change with fibrosis in the right perihilar region with peripheral scarring. There is a new partially loculated right pleural effusion. On the left, there is airspace consolidation with effusion. There is cardiomegaly. Pulmonary vascularity is stable, within normal limits on the left and showing evidence of distortion on the right, likely due to previous radiation therapy. There is no adenopathy evident by radiography. Port-A-Cath tip is in the superior vena cava. No pneumothorax. There is aortic atherosclerosis. No evident bone lesions. IMPRESSION: There are currently pleural effusions bilaterally with what appears to be loculation of the effusion on the right. Consolidation in the left lower lobe may represent pneumonia in this area. There is radiation therapy fibrosis in the right perihilar region with peripheral scarring in the right mid lung. Stable cardiomegaly. There is aortic atherosclerosis. Followup PA and lateral chest radiographs recommended in 3-4 weeks following trial of antibiotic therapy to ensure resolution and exclude underlying malignancy. Electronically Signed   By: Lowella Grip III M.D.   On: 09/05/2016 09:59      Assessment & Plan: Ms. PRESCILLA MONGER is a 83 y.o.  female with COPD, diabetes mellitus type II, hypothyroidism, obstructive sleep apnea, CVA, history of lung cancer, who was admitted to [facility] on 09/05/2016  1. Acute renal failure with hyperkalemia and hyponatremia: seems to be prerenal azotemia and possible ATN.  Baseline creatinine of 0.91 on 08/16/16.  - Agree with IV fluids - Foley  catheter. Monitor urine output. Discussed dialysis with Debbie family.  2. Anemia with renal failure: hemoglobin 8.6.  - may consider epo  3. Sepsis: with right lower lobe pneumonia. Concern for urinary tract infection. Urine culture pending - empiric antibiotics: zosyn and vanco.  - solumedrol.     LOS: [place], [place] 4/10/201812:18 PM

## 2016-09-06 NOTE — Progress Notes (Signed)
Daughter [name] asked for something to help pt "relax". Pt lying on bed with eyes closed, responding to voice. PRN Ativan '1mg'$  given IV per family's request. Family members still present at bedside as of this time.

## 2016-09-06 NOTE — Progress Notes (Signed)
Palliative:  Full note to follow. I have spoken and reviewed Ms. Garms condition with HCPOA/dtr/caregiver [name]. [name] is clear that her mother has had no QOL and her wishes have been clear not to be prolonged in this state. They have decided to proceed with full comfort care at this time. Anticipate likely hospital death. They are calling family to the bedside.   Vinie Sill, NP Palliative Medicine Team Pager # [phone] (M-F 8a-5p) Team Phone # [phone] (Nights/Weekends)

## 2016-09-06 NOTE — Consult Note (Signed)
Consultation Note Date: 09/06/2016   Patient Name: [name]  DOB: [birth year]  MRN: [id number]  Age / Sex: 83 y.o., female  PCP: Lorelee Market, MD Referring Physician: Gladstone Lighter, MD  Reason for Consultation: Establishing goals of care   HPI/Patient Profile: 83 y.o. female  with past medical history of lung cancer in remission, chronic atrial fibrillation, COPD, diabetes, hypertension, depression, hypothyroidism, obstructive sleep apnea, history of previous CVA  admitted on 09/05/2016 with chest pain and shortness of breath. Diagnosed with sepsis pneumonia, acute renal failure, hyponatremia, hyperkalemia. Has had worsening mental status and decline over the course of today. Family at bedside has witnessed this decline.   Clinical Assessment and Goals of Care: I met today with daughter/HCPOA [name]. [name] has been her mother's primary caregiver since November. She has witness decline and poor QOL over these past months. [name] says that she watched her father die and is seeing these same signs in her mother. She says that her mother has had no QOL and that she has been clear that she would not want her life prolonged artificially. She says her mother is more than ready to die when her time comes.  We discussed her current status and my assessment. She is unresponsive, pupils small and slightly reactive. After our discussion [name] has decided on full comfort care given her mothers wishes. She is notifying the rest of her family.   Primary Decision Maker HCPOA daughter [name]    SUMMARY OF RECOMMENDATIONS   - DNR - Comfort Care  Code Status/Advance Care Planning:  DNR   Symptom Management:   Pain/dyspnea: Fentanyl 50-100 mcg every hour prn.   Secretions: Robinul prn.   Anxiety: Ativan 0.5-1 mg every 4 hours prn.   Palliative Prophylaxis:   Frequent Pain Assessment, Oral Care and Turn  Reposition  Additional Recommendations (Limitations, Scope, Preferences):  Full Comfort Care  Psycho-social/Spiritual:   Desire for further Chaplaincy support:yes  Additional Recommendations: Caregiving  Support/Resources and Grief/Bereavement Support  Prognosis:   Hours - Days  Discharge Planning: Anticipated Hospital Death      Primary Diagnoses: Present on Admission: . Sepsis ([place])   I have reviewed the medical record, interviewed the patient and family, and examined the patient. The following aspects are pertinent.  Past Medical History:  Diagnosis Date  . Arrhythmia   . Arthritis   . Asthma   . Atrial fibrillation ([place])   . Cancer ([place])    lung cancer  . COPD (chronic obstructive pulmonary disease) ([place])   . Coronary artery disease   . Depression   . Diabetes mellitus without complication ([place])   . Heart attack   . Heart disease   . Heart failure ([place])   . Heartburn   . HLD (hyperlipidemia)   . Hypertension   . Hyperthyroidism   . Kidney failure   . Sleep apnea   . Stroke [facility])    Social History   Social History  . Marital status: Widowed    Spouse name: N/A  . Number  of children: N/A  . Years of education: N/A   Social History Main Topics  . Smoking status: Never Smoker  . Smokeless tobacco: Current User    Types: Snuff  . Alcohol use No  . Drug use: No  . Sexual activity: Not Asked   Other Topics Concern  . None   Social History Narrative  . None   Family History  Problem Relation Age of Onset  . Diabetes Other   . Hypertension Other   . Hematuria Father   . Kidney failure Father   . Kidney cancer Brother   . Kidney failure Mother   . Kidney Stones Brother    Scheduled Meds: . levalbuterol  1.25 mg Nebulization Q6H  . sodium chloride flush  3 mL Intravenous Q12H   Continuous Infusions: . sodium chloride 50 mL/hr at 09/06/16 1512   PRN Meds:.acetaminophen **OR** acetaminophen, antiseptic oral rinse, fentaNYL (SUBLIMAZE)  injection, glycopyrrolate, LORazepam, ondansetron **OR** ondansetron (ZOFRAN) IV, polyvinyl alcohol Allergies  Allergen Reactions  . Bactrim [Sulfamethoxazole-Trimethoprim]     "Diabetic seizures"  . Darvon [Propoxyphene] Other (See Comments)    Patient states she was real weak, felt like she was going to pass out.  . Albuterol Palpitations and Other (See Comments)    Irregular heart beat, CAN TOLERATE XOPENEX  . Fentanyl Nausea And Vomiting and Rash  . Latex Rash  . Tetracyclines & Related Rash   Review of Systems  Unable to perform ROS: Acuity of condition    Physical Exam  Constitutional: She appears well-developed.  HENT:  Head: Normocephalic and atraumatic.  Cardiovascular: Bradycardia present.   Pulmonary/Chest: No accessory muscle usage. No tachypnea. No respiratory distress. She has decreased breath sounds. She has rhonchi.  Abdominal: Soft. Normal appearance.  Neurological: She is unresponsive.  Nursing note and vitals reviewed.   Vital Signs: BP (!) 146/48 (BP Location: Right Arm)   Pulse (!) 49   Temp 98.6 F (37 C) (Oral)   Resp 16   Ht '5\' 6"'$  (1.676 m)   Wt 94.7 kg (208 lb 11.2 oz)   SpO2 98%   BMI 33.69 kg/m  Pain Assessment: No/denies pain POSS *See Group Information*: 1-Acceptable,Awake and alert Pain Score: 0-No pain   SpO2: SpO2: 98 % O2 Device:SpO2: 98 % O2 Flow Rate: .O2 Flow Rate (L/min): 4 L/min  IO: Intake/output summary:  Intake/Output Summary (Last 24 hours) at 09/06/16 1633 Last data filed at 09/06/16 1512  Gross per 24 hour  Intake             2115 ml  Output              550 ml  Net             1565 ml    LBM: Last BM Date: 09/05/16 Baseline Weight: Weight: 100 kg (220 lb 7.4 oz) Most recent weight: Weight: 94.7 kg (208 lb 11.2 oz)     Palliative Assessment/Data:     Time Total: 51mn  Greater than 50%  of this time was spent counseling and coordinating care related to the above assessment and plan.  Signed by: AVinie Sill NP Palliative Medicine Team Pager # [phone](M-F 8a-5p) Team Phone # [phone](Nights/Weekends)

## 2016-09-06 NOTE — Progress Notes (Signed)
Appreciate palliative care input Rapid decline noted in the last few hours. Has hyponatremia, hyperkalemia, acute renal failure and possible acute infarct. Family leaning towards comfort care

## 2016-09-06 NOTE — Consult Note (Signed)
Reason for Consult: confusion/stroke Referring Physician: Dr. Tressia Miners  CC: Confusion   HPI: [name] is an 83 y.o. female with a known history of Lung cancer in remission, chronic atrial fibrillation, COPD, diabetes, hypertension, depression, hypothyroidism, obstructive sleep apnea, history of previous CVA who presents to the hospital but in by her family due to altered mental status shortness of breath. As per family patient has not been herself for last 2-3 days and not talking along with generalized weakness.   Neurology consulted for ? L parietal stroke    Past Medical History:  Diagnosis Date  . Arrhythmia   . Arthritis   . Asthma   . Atrial fibrillation ([place])   . Cancer ([place])    lung cancer  . COPD (chronic obstructive pulmonary disease) ([place])   . Coronary artery disease   . Depression   . Diabetes mellitus without complication ([place])   . Heart attack   . Heart disease   . Heart failure ([place])   . Heartburn   . HLD (hyperlipidemia)   . Hypertension   . Hyperthyroidism   . Kidney failure   . Sleep apnea   . Stroke [facility])     Past Surgical History:  Procedure Laterality Date  . CHOLECYSTECTOMY  1983  . EYE SURGERY  2012/2016  . THYROID SURGERY  1963  . TOTAL HIP ARTHROPLASTY Left 2008    Family History  Problem Relation Age of Onset  . Diabetes Other   . Hypertension Other   . Hematuria Father   . Kidney failure Father   . Kidney cancer Brother   . Kidney failure Mother   . Kidney Stones Brother     Social History:  reports that she has never smoked. Her smokeless tobacco use includes Snuff. She reports that she does not drink alcohol or use drugs.  Allergies  Allergen Reactions  . Bactrim [Sulfamethoxazole-Trimethoprim]     "Diabetic seizures"  . Darvon [Propoxyphene] Other (See Comments)    Patient states she was real weak, felt like she was going to pass out.  . Albuterol Palpitations and Other (See Comments)    Irregular heart beat, CAN  TOLERATE XOPENEX  . Fentanyl Nausea And Vomiting and Rash  . Latex Rash  . Tetracyclines & Related Rash    Medications: I have reviewed the patient's current medications.  ROS: Not able to obtain due to confusion   Physical Examination: Blood pressure (!) 146/48, pulse (!) 49, temperature 98.6 F (37 C), temperature source Oral, resp. rate 16, height '5\' 6"'$  (1.676 m), weight 94.7 kg (208 lb 11.2 oz), SpO2 98 %.    Neurological Examination   Mental Status: Alert,to name onley  Cranial Nerves: II: Discs flat bilaterally; Visual fields grossly normal, pupils equal, round, reactive to light and accommodation III,IV, VI: ptosis not present, extra-ocular motions intact bilaterally V,VII: smile symmetric, facial light touch sensation normal bilaterally VIII: hearing normal bilaterally IX,X: gag reflex present XI: bilateral shoulder shrug XII: midline tongue extension Motor: Generalized weakness but moving all her extremities.   Tone and bulk:normal tone throughout; no atrophy noted Sensory: Pinprick and light touch intact throughout, bilaterally Deep Tendon Reflexes: not tested Right: downgoing   Left: downgoing Cerebellar: Not able to test Gait: not tested       Laboratory Studies:   Basic Metabolic Panel:  Recent Labs Lab 09/05/16 1155 09/06/16 0836  NA 123* 122*  K 6.1* 5.8*  CL 88* 89*  CO2 26 24  GLUCOSE 215* 213*  BUN 47* 45*  CREATININE 2.65* 2.25*  CALCIUM 9.2 8.7*    Liver Function Tests:  Recent Labs Lab 09/05/16 1155  AST 29  ALT 17  ALKPHOS 50  BILITOT 0.7  PROT 7.5  ALBUMIN 4.0   No results for input(s): LIPASE, AMYLASE in the last 168 hours. No results for input(s): AMMONIA in the last 168 hours.  CBC:  Recent Labs Lab 09/05/16 0958 09/06/16 0836  WBC 8.8 8.4  NEUTROABS 7.2*  --   HGB 9.0* 8.6*  HCT 25.4* 25.2*  MCV 83.7 85.3  PLT 319 330    Cardiac Enzymes:  Recent Labs Lab 09/05/16 1155 09/05/16 1858 09/05/16 2301  09/06/16 0836  TROPONINI 0.10* 0.12* 0.12* 0.12*    BNP: Invalid input(s): POCBNP  CBG:  Recent Labs Lab 09/05/16 1820 09/05/16 2048 09/06/16 0744 09/06/16 1131  GLUCAP 194* 192* 208* 223*    Microbiology: Results for orders placed or performed during the hospital encounter of 09/05/16  Blood Culture (routine x 2)     Status: None (Preliminary result)   Collection Time: 09/05/16 10:23 AM  Result Value Ref Range Status   Specimen Description BLOOD LEFT ARM  Final   Special Requests   Final    BOTTLES DRAWN AEROBIC AND ANAEROBIC Blood Culture results may not be optimal due to an excessive volume of blood received in culture bottles   Culture NO GROWTH < 24 HOURS  Final   Report Status PENDING  Incomplete  Blood Culture (routine x 2)     Status: None (Preliminary result)   Collection Time: 09/05/16 10:28 AM  Result Value Ref Range Status   Specimen Description BLOOD RIGHT HAND  Final   Special Requests   Final    BOTTLES DRAWN AEROBIC AND ANAEROBIC Blood Culture adequate volume   Culture NO GROWTH < 24 HOURS  Final   Report Status PENDING  Incomplete    Coagulation Studies: No results for input(s): LABPROT, INR in the last 72 hours.  Urinalysis:  Recent Labs Lab 09/06/16 0800  COLORURINE YELLOW*  LABSPEC 1.019  PHURINE 5.0  GLUCOSEU NEGATIVE  HGBUR MODERATE*  BILIRUBINUR NEGATIVE  KETONESUR NEGATIVE  PROTEINUR 100*  NITRITE NEGATIVE  LEUKOCYTESUR NEGATIVE    Lipid Panel:     Component Value Date/Time   CHOL 137 07/05/2016 0811   CHOL 168 12/11/2013 0507   TRIG 328 (H) 07/05/2016 0811   TRIG 291 (H) 12/11/2013 0507   HDL 35 (L) 07/05/2016 0811   HDL 35 (L) 12/11/2013 0507   CHOLHDL 3.9 07/05/2016 0811   VLDL 66 (H) 07/05/2016 0811   VLDL 58 (H) 12/11/2013 0507   LDLCALC 36 07/05/2016 0811   LDLCALC 75 12/11/2013 0507    HgbA1C:  Lab Results  Component Value Date   HGBA1C 6.6 (H) 10/27/2015    Urine Drug Screen:  No results found for:  LABOPIA, COCAINSCRNUR, LABBENZ, AMPHETMU, THCU, LABBARB  Alcohol Level: No results for input(s): ETH in the last 168 hours.   Imaging: Ct Head Wo Contrast  Result Date: 09/06/2016 CLINICAL DATA:  Altered mental status and difficulty breathing. Slurred speech. EXAM: CT HEAD WITHOUT CONTRAST TECHNIQUE: Contiguous axial images were obtained from the base of the skull through the vertex without intravenous contrast. COMPARISON:  08/14/2016.  12/10/2013 FINDINGS: Brain: Hypodensity in the left cerebellar hemisphere and near the left cerebellar peduncle. This appears to be new from the exam in 2015. Findings raise concern for age-indeterminate infarct. This could be acute or subacute. No evidence for  acute hemorrhage, mass lesion, midline shift or hydrocephalus. Hypodensities in the white matter probably represent chronic changes. Vascular: No hyperdense vessel or unexpected calcification. Skull: No acute bone abnormality. Sinuses/Orbits: Small amount of disease in the right sphenoid sinus. The other visualized paranasal sinuses are clear. Mastoid air cells are clear. Other: None IMPRESSION: Hypodense areas in the left cerebellar hemisphere could represent an acute or subacute infarct. This area could be better characterized with MRI. No acute hemorrhage. These results were called by telephone at the time of interpretation on 09/06/2016 at 11:20 am to the patient's nurse, Yves Dill, who verbally acknowledged these results. Electronically Signed   By: Markus Daft M.D.   On: 09/06/2016 11:23   Mr Jodene Nam Neck Wo Contrast  Result Date: 09/06/2016 CLINICAL DATA:  Altered mental status. Possible left cerebellar infarct on CT. EXAM: MRI HEAD WITHOUT CONTRAST MRA NECK WITHOUT CONTRAST TECHNIQUE: Multiplanar, multiecho pulse sequences of the brain and surrounding structures were obtained without intravenous contrast. Angiographic images of the neck were obtained using MRA technique without intravenous contrast. Carotid  stenosis measurements (when applicable) are obtained utilizing NASCET criteria, using the distal internal carotid diameter as the denominator. COMPARISON:  Head CT 09/06/2016. Neck MRA 08/14/2016. Brain MRI 12/11/2013. FINDINGS: MRI HEAD FINDINGS Brain: There is no evidence of acute infarct, intracranial hemorrhage, mass, midline shift, or extra-axial fluid collection. Patchy T2 hyperintensities in the subcortical and deep cerebral white matter have mildly progressed from the 2015 MRI and are nonspecific but compatible with moderate chronic small vessel ischemic disease. A small chronic right cerebellar infarct is unchanged. Cerebral atrophy is most notable in the parietal lobes. Mild to moderate cerebellar atrophy is also noted. There is no evidence of infarct or other focal abnormality in the left cerebellum to correspond with the abnormal density on CT, which was likely related to streak artifact from imaging through the skullbase. Vascular: Major intracranial vascular flow voids are preserved. Skull and upper cervical spine: Unremarkable bone marrow signal. Sinuses/Orbits: Bilateral cataract extraction. Mild right sphenoid sinus mucosal thickening and/or fluid. Clear mastoid air cells. Other: None. MRA NECK FINDINGS Assessment is limited by lack of IV contrast. The aortic arch and proximal arch vessels were not imaged. Artifact obscures portions of the cervical carotid and vertebral arteries, mainly proximally. There is a medial course of both proximal common carotid arteries which are patent. There is no evidence of flow limiting carotid stenosis, with suspected minimal narrowing of both proximal internal carotid arteries better demonstrated on the recent prior MRA. The vertebral arteries are patent with antegrade flow bilaterally and with the left being slightly larger than the right. The mid and distal V2 segments are widely patent, with the more proximal vertebral arteries inadequately evaluated. Signal loss  in both the distal V3 and proximal V4 segments is likely artifactual. IMPRESSION: 1. No acute intracranial abnormality. 2. Moderate chronic small vessel ischemic disease, mildly progressed from 2015. 3. Limited neck MRA due to lack of IV contrast as detailed above. No significant stenosis identified. Electronically Signed   By: Logan Bores M.D.   On: 09/06/2016 15:10   Mr Brain Wo Contrast  Result Date: 09/06/2016 CLINICAL DATA:  Altered mental status. Possible left cerebellar infarct on CT. EXAM: MRI HEAD WITHOUT CONTRAST MRA NECK WITHOUT CONTRAST TECHNIQUE: Multiplanar, multiecho pulse sequences of the brain and surrounding structures were obtained without intravenous contrast. Angiographic images of the neck were obtained using MRA technique without intravenous contrast. Carotid stenosis measurements (when applicable) are obtained utilizing NASCET criteria, using  the distal internal carotid diameter as the denominator. COMPARISON:  Head CT 09/06/2016. Neck MRA 08/14/2016. Brain MRI 12/11/2013. FINDINGS: MRI HEAD FINDINGS Brain: There is no evidence of acute infarct, intracranial hemorrhage, mass, midline shift, or extra-axial fluid collection. Patchy T2 hyperintensities in the subcortical and deep cerebral white matter have mildly progressed from the 2015 MRI and are nonspecific but compatible with moderate chronic small vessel ischemic disease. A small chronic right cerebellar infarct is unchanged. Cerebral atrophy is most notable in the parietal lobes. Mild to moderate cerebellar atrophy is also noted. There is no evidence of infarct or other focal abnormality in the left cerebellum to correspond with the abnormal density on CT, which was likely related to streak artifact from imaging through the skullbase. Vascular: Major intracranial vascular flow voids are preserved. Skull and upper cervical spine: Unremarkable bone marrow signal. Sinuses/Orbits: Bilateral cataract extraction. Mild right sphenoid sinus  mucosal thickening and/or fluid. Clear mastoid air cells. Other: None. MRA NECK FINDINGS Assessment is limited by lack of IV contrast. The aortic arch and proximal arch vessels were not imaged. Artifact obscures portions of the cervical carotid and vertebral arteries, mainly proximally. There is a medial course of both proximal common carotid arteries which are patent. There is no evidence of flow limiting carotid stenosis, with suspected minimal narrowing of both proximal internal carotid arteries better demonstrated on the recent prior MRA. The vertebral arteries are patent with antegrade flow bilaterally and with the left being slightly larger than the right. The mid and distal V2 segments are widely patent, with the more proximal vertebral arteries inadequately evaluated. Signal loss in both the distal V3 and proximal V4 segments is likely artifactual. IMPRESSION: 1. No acute intracranial abnormality. 2. Moderate chronic small vessel ischemic disease, mildly progressed from 2015. 3. Limited neck MRA due to lack of IV contrast as detailed above. No significant stenosis identified. Electronically Signed   By: Logan Bores M.D.   On: 09/06/2016 15:10   US Renal  Result Date: 09/06/2016 CLINICAL DATA:  Acute renal failure EXAM: RENAL / URINARY TRACT ULTRASOUND COMPLETE COMPARISON:  Renal ultrasound of October 29, 2015 FINDINGS: Right Kidney: Length: 10.9 cm. The renal cortical echotexture remains lower than that of the adjacent liver. There is an upper pole cyst measuring 2.5 x 2.6 x 2.6 cm. There is no hydronephrosis. Left Kidney: Length: 11.4 cm. The renal cortical echotexture is similar to that on the right. There is no focal mass or hydronephrosis. Bladder: Urinary bladder is decompressed with a Foley catheter. IMPRESSION: Normal renal ultrasound examination.  There is no hydronephrosis. Electronically Signed   By: David  Martinique M.D.   On: 09/06/2016 11:11   Dg Chest Port 1 View  Result Date:  09/05/2016 CLINICAL DATA:  Shortness of breath.  History of lung carcinoma EXAM: PORTABLE CHEST 1 VIEW COMPARISON:  September 12, 2016 FINDINGS: There is persistent radiation therapy change with fibrosis in the right perihilar region with peripheral scarring. There is a new partially loculated right pleural effusion. On the left, there is airspace consolidation with effusion. There is cardiomegaly. Pulmonary vascularity is stable, within normal limits on the left and showing evidence of distortion on the right, likely due to previous radiation therapy. There is no adenopathy evident by radiography. Port-A-Cath tip is in the superior vena cava. No pneumothorax. There is aortic atherosclerosis. No evident bone lesions. IMPRESSION: There are currently pleural effusions bilaterally with what appears to be loculation of the effusion on the right. Consolidation in the left lower  lobe may represent pneumonia in this area. There is radiation therapy fibrosis in the right perihilar region with peripheral scarring in the right mid lung. Stable cardiomegaly. There is aortic atherosclerosis. Followup PA and lateral chest radiographs recommended in 3-4 weeks following trial of antibiotic therapy to ensure resolution and exclude underlying malignancy. Electronically Signed   By: Lowella Grip III M.D.   On: 09/05/2016 09:59     Assessment/Plan:  83 y.o. female with a known history of Lung cancer in remission, chronic atrial fibrillation, COPD, diabetes, hypertension, depression, hypothyroidism, obstructive sleep apnea, history of previous CVA who presents to the hospital but in by her family due to altered mental status shortness of breath. As per family patient has not been herself for last 2-3 days and not talking along with generalized weakness.   Neurology consulted for ? L parietal stroke  - MRI reviewed no acute abnormality - but looking at current condition as pt deteriorating and likely will progress to comfort  measures - appreciate primary care team and palliative care evaluation 09/06/2016, 4:29 PM

## 2016-09-06 NOTE — Progress Notes (Signed)
PT Cancellation Note  Patient Details Name: [name] MRN: [id number] DOB: 1926-03-30   Cancelled Treatment:    Reason Eval/Treat Not Completed: Medical issues which prohibited therapy; Pt's Ka 5.8 and above hospital guidelines for participation with PT services at this time.  Will attempt to evaluate pt at a future date/time as medically appropriate.    Linus Salmons PT, DPT 09/06/16, 12:14 PM

## 2016-09-06 NOTE — Progress Notes (Signed)
Patient is alert and family visiting at bedside. Vital signs stable. No signs of distress. Will continue to monitor.

## 2016-09-06 NOTE — Progress Notes (Signed)
Pt has not voided since shift change. Per pt., she has "the urge to pee but cannot". Diapers dry when checked. Bladder scan done and revealed 281m. Paged and spoke to Dr. DMarcille Blanco made aware of the above and latest troponin result of 0.12. Ordered for indwelling foley to stay for 8-12hrs and NS 5059mbolus. Will carry out above orders and continue to monitor.

## 2016-09-06 NOTE — Progress Notes (Signed)
[facility] [place] NAME: [name]    MR#:  [id number]  DATE OF BIRTH:  05-Nov-1925  SUBJECTIVE:  CHIEF COMPLAINT:   Chief Complaint  Patient presents with  . Chest Pain  . Shortness of Breath   - Patient with home oxygen 2 L, came in with lethargy and respiratory failure. -More alert today but confused. Remains on 4 L nasal cannula and still feels very short of breath. -Several lab abnormalities noted.  REVIEW OF SYSTEMS:  Review of Systems  Unable to perform ROS: Mental status change    DRUG ALLERGIES:   Allergies  Allergen Reactions  . Bactrim [Sulfamethoxazole-Trimethoprim]     "Diabetic seizures"  . Darvon [Propoxyphene] Other (See Comments)    Patient states she was real weak, felt like she was going to pass out.  . Albuterol Palpitations and Other (See Comments)    Irregular heart beat, CAN TOLERATE XOPENEX  . Fentanyl Nausea And Vomiting and Rash  . Latex Rash  . Tetracyclines & Related Rash    VITALS:  Blood pressure (!) 153/55, pulse (!) 51, temperature 98.1 F (36.7 C), temperature source Oral, resp. rate (!) 22, height '5\' 6"'$  (1.676 m), weight 94.7 kg (208 lb 11.2 oz), SpO2 96 %.  PHYSICAL EXAMINATION:  Physical Exam  GENERAL:  83 y.o.-year-old elderly patient lying in the bed with no acute distress.  EYES: Pupils equal, round, reactive to light and accommodation. No scleral icterus. Extraocular muscles intact.  HEENT: Head atraumatic, normocephalic. Oropharynx and nasopharynx clear. No oral thrush NECK:  Supple, no jugular venous distention. No thyroid enlargement, no tenderness.  LUNGS: Tight breath sounds bilaterally, scattered expiratory wheezing, no rales,rhonchi or crepitation. No use of accessory muscles of respiration. Superficial shallow breath sounds CARDIOVASCULAR: S1, S2 normal. No  rubs, or gallops. 3/6 systolic murmur is present ABDOMEN: Soft, nontender, nondistended. Bowel sounds present. No  organomegaly or mass.  EXTREMITIES: No pedal edema, cyanosis, or clubbing.  NEUROLOGIC: Cranial nerves II through XII are intact. Muscle strength 5/5 in all extremities. Sensation intact. Gait not checked. Global weakness is noted PSYCHIATRIC: The patient is alert and oriented to self.  SKIN: No obvious rash, lesion, or ulcer.    LABORATORY PANEL:   CBC  Recent Labs Lab 09/06/16 0836  WBC 8.4  HGB 8.6*  HCT 25.2*  PLT 330   ------------------------------------------------------------------------------------------------------------------  Chemistries   Recent Labs Lab 09/05/16 1155 09/06/16 0836  NA 123* 122*  K 6.1* 5.8*  CL 88* 89*  CO2 26 24  GLUCOSE 215* 213*  BUN 47* 45*  CREATININE 2.65* 2.25*  CALCIUM 9.2 8.7*  AST 29  --   ALT 17  --   ALKPHOS 50  --   BILITOT 0.7  --    ------------------------------------------------------------------------------------------------------------------  Cardiac Enzymes  Recent Labs Lab 09/05/16 2301  TROPONINI 0.12*   ------------------------------------------------------------------------------------------------------------------  RADIOLOGY:  Dg Chest Port 1 View  Result Date: 09/05/2016 CLINICAL DATA:  Shortness of breath.  History of lung carcinoma EXAM: PORTABLE CHEST 1 VIEW COMPARISON:  September 12, 2016 FINDINGS: There is persistent radiation therapy change with fibrosis in the right perihilar region with peripheral scarring. There is a new partially loculated right pleural effusion. On the left, there is airspace consolidation with effusion. There is cardiomegaly. Pulmonary vascularity is stable, within normal limits on the left and showing evidence of distortion on the right, likely due to previous radiation therapy. There is no adenopathy evident by radiography. Port-A-Cath tip  is in the superior vena cava. No pneumothorax. There is aortic atherosclerosis. No evident bone lesions. IMPRESSION: There are currently pleural  effusions bilaterally with what appears to be loculation of the effusion on the right. Consolidation in the left lower lobe may represent pneumonia in this area. There is radiation therapy fibrosis in the right perihilar region with peripheral scarring in the right mid lung. Stable cardiomegaly. There is aortic atherosclerosis. Followup PA and lateral chest radiographs recommended in 3-4 weeks following trial of antibiotic therapy to ensure resolution and exclude underlying malignancy. Electronically Signed   By: Lowella Grip III M.D.   On: 09/05/2016 09:59    EKG:   Orders placed or performed during the hospital encounter of 09/05/16  . ED EKG  . ED EKG    ASSESSMENT AND PLAN:   83 year old female with past medical history of COPD, diabetes, hypertension, hypothyroidism, history of previous CVA, history of lung cancer, who presents to the hospital due to altered mental status, shortness of breath and noted to be in acute kidney injury with hyperkalemia and also noted to have a pneumonia.  1. Sepsis-suspected to be secondary to pneumonia as noted on the chest x-ray. -Follow blood cultures. Currently on vancomycin and Zosyn. -Check MRSA PCR and discontinue vancomycin if negative. -Urine analysis is pending at this time.  2. Acute renal failure-renal function was normal 2 weeks ago. Patient states she has been drinking fluids very well. Continue with the Foley catheter secondary to urinary retention. Strict input and output monitoring. -Check a renal ultrasound as there has been no significant improvement in kidney function today. If remains like that, we'll consult nephrology.  3. Hyponatremia and hyperkalemia-hyponatremia could be secondary to hypovolemia. Continue with IV fluids and monitor. Discontinue any medications that would exacerbate this. -Hyperkalemia has been treated appropriately and potassium is better today.  4. Altered mental status-likely metabolic encephalopathy  secondary to sepsis and electrolyte abnormalities and acute renal failure. -More alert than yesterday, however still not back to baseline. CT head to rule out anyCVA -No focal deficits. Monitor closely.  5. Elevated troponin-demand ischemia. Monitor at this time  6. COPD and history of lung cancer-on 2 L home oxygen. Currently on 4 L. Acute hypoxic respiratory failure secondary to pneumonia. Continue antibiotics. -Added duo nebs and also steroids due to COPD exacerbation. Monitor closely.  7. Diabetes type 2 - on sliding scale insulin.  8. Hypothyroidism-continue Synthroid.  9. DVT prophylaxis-on subcutaneous heparin  10. Anemia of chronic disease-follows at the cancer center. Received B12 shot last month. Monitor. No transfusion if hemoglobin is greater than 7   Physical therapy when more stable. Family updated at bedside  All the records are reviewed and case discussed with Care Management/Social Workerr. Management plans discussed with the patient, family and they are in agreement.  CODE STATUS: DNR  TOTAL TIME TAKING CARE OF THIS PATIENT: 39 minutes.   POSSIBLE D/C IN 2-3 DAYS, DEPENDING ON CLINICAL CONDITION.   Gladstone Lighter M.D on 09/06/2016 at 9:44 AM  Between 7am to 6pm - Pager - [phone]  After 6pm go to [email or web address] - password [email or web address] [email or web address]  Office  [phone]  CC: Primary care physician; Lorelee Market, MD

## 2016-09-06 NOTE — Progress Notes (Signed)
Nephro MD ([place]) per conversation told this writer not to give kayxelate.

## 2016-09-06 NOTE — Progress Notes (Signed)
Pharmacy Antibiotic Note  [name] is a 83 y.o. female admitted on 09/05/2016 with sepsis.  Pharmacy has been consulted for Vancomycin and Zosyn  dosing.  Plan: Will dose vancomycin 1000 mg iv q 36 hours and f/u MRSA PCR.   Will continue Zosyn 3.375 g EI q 12 hours and decrease interval to q 8 hours if SCr continues to improve.    Height: '5\' 6"'$  (167.6 cm) Weight: 208 lb 11.2 oz (94.7 kg) IBW/kg (Calculated) : 59.3  Temp (24hrs), Avg:98.1 F (36.7 C), Min:98.1 F (36.7 C), Max:98.1 F (36.7 C)   Recent Labs Lab 09/05/16 0958 09/05/16 1023 09/05/16 1155 09/06/16 0836  WBC 8.8  --   --  8.4  CREATININE  --   --  2.65* 2.25*  LATICACIDVEN  --  1.3  --   --     Estimated Creatinine Clearance: 20.1 mL/min (A) (by C-G formula based on SCr of 2.25 mg/dL (H)).    Allergies  Allergen Reactions  . Bactrim [Sulfamethoxazole-Trimethoprim]     "Diabetic seizures"  . Darvon [Propoxyphene] Other (See Comments)    Patient states she was real weak, felt like she was going to pass out.  . Albuterol Palpitations and Other (See Comments)    Irregular heart beat, CAN TOLERATE XOPENEX  . Fentanyl Nausea And Vomiting and Rash  . Latex Rash  . Tetracyclines & Related Rash    Antimicrobials this admission: Vancomycin 4/9 >>  Zosyn  4/9  >>   Dose adjustments this admission:   Microbiology results: BCx: NGTD UCx:  sent MRSA PCR: sent  Thank you for allowing pharmacy to be a part of this patient's care.  Ulice Dash D 09/06/2016 2:29 PM

## 2016-09-06 NOTE — Progress Notes (Signed)
Inpatient Diabetes Program Recommendations  AACE/ADA: New Consensus Statement on Inpatient Glycemic Control (2015)  Target Ranges:  Prepandial:   less than 140 mg/dL      Peak postprandial:   less than 180 mg/dL (1-2 hours)      Critically ill patients:  140 - 180 mg/dL   Lab Results  Component Value Date   GLUCAP 208 (H) 09/06/2016   HGBA1C 6.6 (H) 10/27/2015    Review of Glycemic Control:  Results for [name], [name] (MRN [id number]) as of 09/06/2016 10:22  Ref. Range 09/05/2016 18:20 09/05/2016 20:48 09/06/2016 07:44  Glucose-Capillary Latest Ref Range: 65 - 99 mg/dL 194 (H) 192 (H) 208 (H)   Diabetes history: Type 2 diabetes Outpatient Diabetes medications: Glucotrol XL 10 mg daily Current orders for Inpatient glycemic control:  Novolog sensitive tid with meals and HS, Solumedrol 40 mg IV q 12 hours  Inpatient Diabetes Program Recommendations:    Consider increasing Novolog correction to moderate tid with meals if appropriate while on steroids.   Thanks, Adah Perl, RN, BC-ADM Inpatient Diabetes Coordinator Pager [phone] (8a-5p)

## 2016-09-07 LAB — URINE CULTURE: Culture: NO GROWTH

## 2016-09-07 MED ORDER — LORAZEPAM 2 MG/ML IJ SOLN
0.5000 mg | INTRAMUSCULAR | Status: DC | PRN
  Administered 2016-09-07: 1 mg via INTRAVENOUS
  Filled 2016-09-07: qty 1

## 2016-09-07 MED ORDER — LEVALBUTEROL HCL 1.25 MG/0.5ML IN NEBU: 1.2500 mg | INHALATION_SOLUTION | Freq: Four times a day (QID) | RESPIRATORY_TRACT | Status: DC | PRN

## 2016-09-07 MED ORDER — HYDROMORPHONE HCL 1 MG/ML IJ SOLN
0.5000 mg | INTRAMUSCULAR | Status: DC | PRN
Start: 2016-09-07 — End: 2016-09-07

## 2016-09-07 MED ORDER — LORAZEPAM 2 MG/ML IJ SOLN
2.0000 mg | Freq: Once | INTRAMUSCULAR | Status: DC
  Filled 2016-09-07: qty 1

## 2016-09-07 MED ORDER — MORPHINE SULFATE (CONCENTRATE) 20 MG/ML PO SOLN: 10.0000 mg | ORAL | 0 refills | Status: AC | PRN

## 2016-09-07 MED ORDER — LORAZEPAM 2 MG PO TABS: 2.0000 mg | ORAL_TABLET | ORAL | 0 refills | Status: AC | PRN

## 2016-09-07 MED ORDER — LORAZEPAM 2 MG/ML IJ SOLN
2.0000 mg | INTRAMUSCULAR | Status: DC | PRN
  Administered 2016-09-07 (×2): 2 mg via INTRAVENOUS
  Filled 2016-09-07: qty 1

## 2016-09-07 NOTE — Progress Notes (Signed)
New hospice home referral received from [place] following a Palliative Medicine consult. [name] is an 83 year old woman with a history of Lung Cancer in remission, chronic atrial fibrillation, COPD, diabetes, hypertension, depression, hypothyroidism, obstructive sleep apnea, history of previous CVA  admitted on 09/05/2016 with chest pain and shortness of breath. Diagnosed with sepsis pneumonia, acute renal failure, hyponatremia, hyperkalemia. Has had worsening mental status and decline over the course of the last day. Palliative Medicine was consulted for goals of care and met with family this morning. They have chosen to focus on her comfort with transfer to the hopsice home. She has required multiple doses of lorazepam for anxiety over the last 24 hours. Writer met with patient's daughter [name] to initiate education regarding hospice services, philosophy and team approach to care with good understanding voiced. Questions answered, consents signed, patient information faxed to referral.  Patient seen lying in bed, opens eyes to touch, no verbal response, she is not eating or swallowing oral medications. Support given to family at bedside. Hospital care team all aware of planned transport. Report called to the Hospice home, EMS notified for transport. Flo Shanks RN, BSN, [facility] [place], hospital liaison [phone] c

## 2016-09-07 NOTE — Progress Notes (Signed)
EMS arrived for transport.

## 2016-09-07 NOTE — Progress Notes (Signed)
EMS called by Hospice liason, foley care, gown change. Patient discharge to [facility] home

## 2016-09-07 NOTE — Progress Notes (Signed)
Daily Progress Note   Patient Name: [name]       Date: 09/07/2016 DOB: 30-Oct-1925  Age: 83 y.o. MRN#: [id number] Attending Physician: Gladstone Lighter, MD Primary Care Physician: Lorelee Market, MD Admit Date: 09/05/2016  Reason for Consultation/Follow-up: Establishing goals of care  Subjective: Ms. Biss is a little more alert at times but very confused. She has been talking and singing but not really responding or conversing with family members.   Length of Stay: 2  Current Medications: Scheduled Meds:  . LORazepam  2 mg Intravenous Once  . sodium chloride flush  3 mL Intravenous Q12H    Continuous Infusions: . sodium chloride 10 mL/hr at 09/06/16 1618    PRN Meds: acetaminophen **OR** acetaminophen, antiseptic oral rinse, glycopyrrolate, HYDROmorphone (DILAUDID) injection, levalbuterol, LORazepam, ondansetron **OR** ondansetron (ZOFRAN) IV, polyvinyl alcohol  Physical Exam       Constitutional: She appears well-developed.  HENT:  Head: Normocephalic and atraumatic.  Cardiovascular: Bradycardia present.   Pulmonary/Chest: No accessory muscle usage. No tachypnea. No respiratory distress. She has decreased breath sounds. She has rhonchi.  Abdominal: Soft. Normal appearance.  Neurological: Alert but confused Nursing note and vitals reviewed.    Vital Signs: BP (!) 157/48   Pulse (!) 46   Temp 98.1 F (36.7 C) (Oral)   Resp 18   Ht '5\' 6"'$  (1.676 m)   Wt 94.7 kg (208 lb 11.2 oz)   SpO2 95%   BMI 33.69 kg/m  SpO2: SpO2: 95 % O2 Device: O2 Device: Nasal Cannula O2 Flow Rate: O2 Flow Rate (L/min): 4 L/min  Intake/output summary:  Intake/Output Summary (Last 24 hours) at 09/07/16 1124 Last data filed at 09/07/16 0753  Gross per 24 hour  Intake               50  ml  Output              275 ml  Net             -225 ml   LBM: Last BM Date: 09/05/16 Baseline Weight: Weight: 100 kg (220 lb 7.4 oz) Most recent weight: Weight: 94.7 kg (208 lb 11.2 oz)       Palliative Assessment/Data:      Patient Active Problem List   Diagnosis Date Noted  . Acute renal  failure ([place])   . Goals of care, counseling/discussion   . Palliative care encounter   . Sepsis ([place]) 09/05/2016  . Hyponatremia 08/16/2016  . Hyperkalemia 08/16/2016  . Renal insufficiency 08/16/2016  . Pernicious anemia 08/16/2016  . Orthostatic hypotension 08/15/2016  . Pre-syncope 08/12/2016  . Chest pain, rule out acute myocardial infarction 07/04/2016  . Recurrent UTI 12/29/2015  . Dysuria 12/29/2015  . Acute kidney injury ([place]) 06/27/2015  . Hypoglycemia 06/27/2015  . OBSTRUCTIVE SLEEP APNEA 03/15/2007  . CORONARY ARTERY DISEASE 03/15/2007  . ASTHMA, CHRONIC OBSTRUCTIVE NOS 03/15/2007  . FIBROMYALGIA 03/15/2007    Palliative Care Assessment & Plan   HPI: 83 y.o. female  with past medical history of lung cancer in remission, chronic atrial fibrillation, COPD, diabetes, hypertension, depression, hypothyroidism, obstructive sleep apnea, history of previous CVA  admitted on 09/05/2016 with chest pain and shortness of breath. Diagnosed with sepsis pneumonia, acute renal failure, hyponatremia, hyperkalemia. Has had worsening mental status and decline over the course of today. Family at bedside has witnessed this decline.   Assessment: I met again today with [name] and also with multiple other family members (Ms. Steig's children). They all endorse that comfort and QOL is most important for Ms. Inman and that her QOL has been suffering. At this time they are hopeful to spend good quality time with their mother. We discussed concerns of limitation of visitors to allow rest and private visitation with her children. We discussed signs/symptoms at EOL and we discussed how to manage some of her  agitation with rest, decreased stimulation, and medication. Questions/concerns addressed. Emotional support provided.   Recommendations/Plan:  Pain/dyspnea: Fentanyl 50-100 mcg every hour prn.   Secretions: Robinul prn.   Anxiety: Ativan 0.5-1 mg every 4 hours prn.    Goals of Care and Additional Recommendations:  Limitations on Scope of Treatment: Full Comfort Care  Code Status:  DNR  Prognosis:   Hours - Days  Discharge Planning:  Hospice facility   Thank you for allowing the Palliative Medicine Team to assist in the care of this patient.   Total Time 46mn Prolonged Time Billed  no       Greater than 50%  of this time was spent counseling and coordinating care related to the above assessment and plan.  AVinie Sill NP Palliative Medicine Team Pager # [phone](M-F 8a-5p) Team Phone # [phone](Nights/Weekends)

## 2016-09-07 NOTE — Discharge Summary (Signed)
[facility] NAME: [name]    MR#:  [id number]  DATE OF BIRTH:  10-22-1925  DATE OF ADMISSION:  09/05/2016   ADMITTING PHYSICIAN: Henreitta Leber, MD  DATE OF DISCHARGE: 09/07/2016  PRIMARY CARE PHYSICIAN: Lorelee Market, MD   ADMISSION DIAGNOSIS:   Shortness of breath [R06.02] Pleural effusion [J90] Acute renal failure, unspecified acute renal failure type ([place]) [N17.9] Community acquired pneumonia of left lower lobe of lung ([place]) [J18.1]  DISCHARGE DIAGNOSIS:   Active Problems:   Sepsis ([place])   Acute renal failure ([place])   Goals of care, counseling/discussion   Palliative care encounter   SECONDARY DIAGNOSIS:   Past Medical History:  Diagnosis Date  . Arrhythmia   . Arthritis   . Asthma   . Atrial fibrillation ([place])   . Cancer ([place])    lung cancer  . COPD (chronic obstructive pulmonary disease) ([place])   . Coronary artery disease   . Depression   . Diabetes mellitus without complication ([place])   . Heart attack   . Heart disease   . Heart failure ([place])   . Heartburn   . HLD (hyperlipidemia)   . Hypertension   . Hyperthyroidism   . Kidney failure   . Sleep apnea   . Stroke [facility])     HOSPITAL COURSE:   83 year old female with past medical history of COPD, diabetes, hypertension, hypothyroidism, history of previous CVA, history of lung cancer, who presents to the hospital due to altered mental status, shortness of breath and noted to be in acute kidney injury with hyperkalemia and also noted to have a pneumonia.  Patient was being treated for her pneumonia and acute renal failure. In spite of antibiotics and IV lower legs per more than 12 hours, her function hasn't improved much. Then she developed an episode of slurred speech and stat CT head revealed possible left parietal infarct. -Patient's clinical condition rapidly deteriorated. She was having hyperventilation and increased oxygen  requirements. MRI of the brain did not confirm an acute infarct. However due to other medical issues and clinically deteriorating, palliative care was concentrated and family wanted the patient to be comfort care. Patient is already a DO NOT RESUSCITATE.  Patient is stable for transfer to hospice home today.  Her diagnoses are as following:  1. Sepsis secondary to pneumonia 2. Acute renal failure 3. Hyponatremia 4. Hyperkalemia 5. Metabolic encephalopathy 6. COPD on 2 L home oxygen 7. History of lung cancer 8. History of CVA 9. Diabetes mellitus 10. Hypothyroidism 11. Anemia of chronic disease   DISCHARGE CONDITIONS:   Critical CONSULTS OBTAINED:   Treatment Team:  Lavonia Dana, MD  DRUG ALLERGIES:   Allergies  Allergen Reactions  . Bactrim [Sulfamethoxazole-Trimethoprim]     "Diabetic seizures"  . Darvon [Propoxyphene] Other (See Comments)    Patient states she was real weak, felt like she was going to pass out.  . Albuterol Palpitations and Other (See Comments)    Irregular heart beat, CAN TOLERATE XOPENEX  . Fentanyl Nausea And Vomiting and Rash  . Latex Rash  . Tetracyclines & Related Rash   DISCHARGE MEDICATIONS:   Allergies as of 09/07/2016      Reactions   Bactrim [sulfamethoxazole-trimethoprim]    "Diabetic seizures"   Darvon [propoxyphene] Other (See Comments)   Patient states she was real weak, felt like she was going to pass out.   Albuterol Palpitations, Other (See Comments)   Irregular heart beat, CAN  TOLERATE XOPENEX   Fentanyl Nausea And Vomiting, Rash   Latex Rash   Tetracyclines & Related Rash      Medication List    STOP taking these medications   ALPRAZolam 0.25 MG tablet Commonly known as:  XANAX   amiodarone 200 MG tablet Commonly known as:  PACERONE   amLODipine 10 MG tablet Commonly known as:  NORVASC   aspirin 81 MG EC tablet   ciprofloxacin 250 MG tablet Commonly known as:  CIPRO   clopidogrel 75 MG tablet Commonly  known as:  PLAVIX   gabapentin 100 MG capsule Commonly known as:  NEURONTIN   glipiZIDE 10 MG 24 hr tablet Commonly known as:  GLUCOTROL XL   iron polysaccharides 150 MG capsule Commonly known as:  NIFEREX   isosorbide mononitrate 60 MG 24 hr tablet Commonly known as:  IMDUR   levothyroxine 125 MCG tablet Commonly known as:  SYNTHROID, LEVOTHROID   meclizine 25 MG tablet Commonly known as:  ANTIVERT   meloxicam 7.5 MG tablet Commonly known as:  MOBIC   montelukast 10 MG tablet Commonly known as:  SINGULAIR   nitroGLYCERIN 0.4 MG SL tablet Commonly known as:  NITROSTAT   pantoprazole 40 MG tablet Commonly known as:  PROTONIX   rosuvastatin 20 MG tablet Commonly known as:  CRESTOR   sucralfate 1 g tablet Commonly known as:  CARAFATE   traMADol-acetaminophen 37.5-325 MG tablet Commonly known as:  ULTRACET   Vitamin D3 5000 units Tabs     TAKE these medications   LORazepam 2 MG tablet Commonly known as:  ATIVAN Take 1 tablet (2 mg total) by mouth every 4 (four) hours as needed for anxiety.   morphine 20 MG/ML concentrated solution Commonly known as:  ROXANOL Take 0.5 mLs (10 mg total) by mouth every 2 (two) hours as needed for severe pain.         DISCHARGE LOCATION:   Hospice home  If you experience worsening of your admission symptoms, develop shortness of breath, life threatening emergency, suicidal or homicidal thoughts you must seek medical attention immediately by calling 911 or calling your MD immediately  if symptoms less severe.  You Must read complete instructions/literature along with all the possible adverse reactions/side effects for all the Medicines you take and that have been prescribed to you. Take any new Medicines after you have completely understood and accpet all the possible adverse reactions/side effects.   Please note  You were cared for by a hospitalist during your hospital stay. If you have any questions about your discharge  medications or the care you received while you were in the hospital after you are discharged, you can call the unit and asked to speak with the hospitalist on call if the hospitalist that took care of you is not available. Once you are discharged, your primary care physician will handle any further medical issues. Please note that NO REFILLS for any discharge medications will be authorized once you are discharged, as it is imperative that you return to your primary care physician (or establish a relationship with a primary care physician if you do not have one) for your aftercare needs so that they can reassess your need for medications and monitor your lab values.    On the day of Discharge:  VITAL SIGNS:   Blood pressure (!) 157/48, pulse (!) 46, temperature 98.1 F (36.7 C), temperature source Oral, resp. rate 18, height '5\' 6"'$  (1.676 m), weight 94.7 kg (208 lb 11.2 oz), SpO2  95 %.  PHYSICAL EXAMINATION:    GENERAL:  83 y.o.-year-old patient lying in the bed, critically ill appearing, alert.  EYES: Pupils equal, round, reactive to light and accommodation. no scleral icterus. Extraocular muscles intact.  HEENT: Head atraumatic, normocephalic. Oropharynx and nasopharynx clear.  NECK:  Supple, no jugular venous distention. No thyroid enlargement, no tenderness.  LUNGS: Normal breath sounds bilaterally, no wheezing, rales,rhonchi or crepitation. No use of accessory muscles of respiration. Decreased bibasilar breath sounds, CARDIOVASCULAR: S1, S2 normal. No rubs, or gallops. 3/6 systolic murmur present. ABDOMEN: Soft, non-tender, non-distended. Bowel sounds present. No organomegaly or mass.  EXTREMITIES: No cyanosis, or clubbing. 1+ pedal edema present NEUROLOGIC: Alert and confused. Not following any commands.  PSYCHIATRIC: The patient is alert and disoriented SKIN: No obvious rash, lesion, or ulcer.   DATA REVIEW:   CBC  Recent Labs Lab 09/06/16 0836  WBC 8.4  HGB 8.6*  HCT 25.2*   PLT 330    Chemistries   Recent Labs Lab 09/05/16 1155 09/06/16 0836  NA 123* 122*  K 6.1* 5.8*  CL 88* 89*  CO2 26 24  GLUCOSE 215* 213*  BUN 47* 45*  CREATININE 2.65* 2.25*  CALCIUM 9.2 8.7*  AST 29  --   ALT 17  --   ALKPHOS 50  --   BILITOT 0.7  --      Microbiology Results  Results for orders placed or performed during the hospital encounter of 09/05/16  Blood Culture (routine x 2)     Status: None (Preliminary result)   Collection Time: 09/05/16 10:23 AM  Result Value Ref Range Status   Specimen Description BLOOD LEFT ARM  Final   Special Requests   Final    BOTTLES DRAWN AEROBIC AND ANAEROBIC Blood Culture results may not be optimal due to an excessive volume of blood received in culture bottles   Culture NO GROWTH 2 DAYS  Final   Report Status PENDING  Incomplete  Blood Culture (routine x 2)     Status: None (Preliminary result)   Collection Time: 09/05/16 10:28 AM  Result Value Ref Range Status   Specimen Description BLOOD RIGHT HAND  Final   Special Requests   Final    BOTTLES DRAWN AEROBIC AND ANAEROBIC Blood Culture adequate volume   Culture NO GROWTH 2 DAYS  Final   Report Status PENDING  Incomplete  Culture, Urine     Status: None   Collection Time: 09/05/16  4:50 PM  Result Value Ref Range Status   Specimen Description URINE, CATHETERIZED  Final   Special Requests NONE  Final   Culture   Final    NO GROWTH Performed at [facility], [place] [address], [place], [place] [address]    Report Status 09/07/2016 FINAL  Final    RADIOLOGY:  Ct Head Wo Contrast  Result Date: 09/06/2016 CLINICAL DATA:  Altered mental status and difficulty breathing. Slurred speech. EXAM: CT HEAD WITHOUT CONTRAST TECHNIQUE: Contiguous axial images were obtained from the base of the skull through the vertex without intravenous contrast. COMPARISON:  08/14/2016.  12/10/2013 FINDINGS: Brain: Hypodensity in the left cerebellar hemisphere and near the left cerebellar  peduncle. This appears to be new from the exam in 2015. Findings raise concern for age-indeterminate infarct. This could be acute or subacute. No evidence for acute hemorrhage, mass lesion, midline shift or hydrocephalus. Hypodensities in the white matter probably represent chronic changes. Vascular: No hyperdense vessel or unexpected calcification. Skull: No acute bone abnormality. Sinuses/Orbits:  Small amount of disease in the right sphenoid sinus. The other visualized paranasal sinuses are clear. Mastoid air cells are clear. Other: None IMPRESSION: Hypodense areas in the left cerebellar hemisphere could represent an acute or subacute infarct. This area could be better characterized with MRI. No acute hemorrhage. These results were called by telephone at the time of interpretation on 09/06/2016 at 11:20 am to the patient's nurse, Yves Dill, who verbally acknowledged these results. Electronically Signed   By: Markus Daft M.D.   On: 09/06/2016 11:23   Mr Jodene Nam Neck Wo Contrast  Result Date: 09/06/2016 CLINICAL DATA:  Altered mental status. Possible left cerebellar infarct on CT. EXAM: MRI HEAD WITHOUT CONTRAST MRA NECK WITHOUT CONTRAST TECHNIQUE: Multiplanar, multiecho pulse sequences of the brain and surrounding structures were obtained without intravenous contrast. Angiographic images of the neck were obtained using MRA technique without intravenous contrast. Carotid stenosis measurements (when applicable) are obtained utilizing NASCET criteria, using the distal internal carotid diameter as the denominator. COMPARISON:  Head CT 09/06/2016. Neck MRA 08/14/2016. Brain MRI 12/11/2013. FINDINGS: MRI HEAD FINDINGS Brain: There is no evidence of acute infarct, intracranial hemorrhage, mass, midline shift, or extra-axial fluid collection. Patchy T2 hyperintensities in the subcortical and deep cerebral white matter have mildly progressed from the 2015 MRI and are nonspecific but compatible with moderate chronic small  vessel ischemic disease. A small chronic right cerebellar infarct is unchanged. Cerebral atrophy is most notable in the parietal lobes. Mild to moderate cerebellar atrophy is also noted. There is no evidence of infarct or other focal abnormality in the left cerebellum to correspond with the abnormal density on CT, which was likely related to streak artifact from imaging through the skullbase. Vascular: Major intracranial vascular flow voids are preserved. Skull and upper cervical spine: Unremarkable bone marrow signal. Sinuses/Orbits: Bilateral cataract extraction. Mild right sphenoid sinus mucosal thickening and/or fluid. Clear mastoid air cells. Other: None. MRA NECK FINDINGS Assessment is limited by lack of IV contrast. The aortic arch and proximal arch vessels were not imaged. Artifact obscures portions of the cervical carotid and vertebral arteries, mainly proximally. There is a medial course of both proximal common carotid arteries which are patent. There is no evidence of flow limiting carotid stenosis, with suspected minimal narrowing of both proximal internal carotid arteries better demonstrated on the recent prior MRA. The vertebral arteries are patent with antegrade flow bilaterally and with the left being slightly larger than the right. The mid and distal V2 segments are widely patent, with the more proximal vertebral arteries inadequately evaluated. Signal loss in both the distal V3 and proximal V4 segments is likely artifactual. IMPRESSION: 1. No acute intracranial abnormality. 2. Moderate chronic small vessel ischemic disease, mildly progressed from 2015. 3. Limited neck MRA due to lack of IV contrast as detailed above. No significant stenosis identified. Electronically Signed   By: Logan Bores M.D.   On: 09/06/2016 15:10   Mr Brain Wo Contrast  Result Date: 09/06/2016 CLINICAL DATA:  Altered mental status. Possible left cerebellar infarct on CT. EXAM: MRI HEAD WITHOUT CONTRAST MRA NECK WITHOUT  CONTRAST TECHNIQUE: Multiplanar, multiecho pulse sequences of the brain and surrounding structures were obtained without intravenous contrast. Angiographic images of the neck were obtained using MRA technique without intravenous contrast. Carotid stenosis measurements (when applicable) are obtained utilizing NASCET criteria, using the distal internal carotid diameter as the denominator. COMPARISON:  Head CT 09/06/2016. Neck MRA 08/14/2016. Brain MRI 12/11/2013. FINDINGS: MRI HEAD FINDINGS Brain: There is no evidence of acute  infarct, intracranial hemorrhage, mass, midline shift, or extra-axial fluid collection. Patchy T2 hyperintensities in the subcortical and deep cerebral white matter have mildly progressed from the 2015 MRI and are nonspecific but compatible with moderate chronic small vessel ischemic disease. A small chronic right cerebellar infarct is unchanged. Cerebral atrophy is most notable in the parietal lobes. Mild to moderate cerebellar atrophy is also noted. There is no evidence of infarct or other focal abnormality in the left cerebellum to correspond with the abnormal density on CT, which was likely related to streak artifact from imaging through the skullbase. Vascular: Major intracranial vascular flow voids are preserved. Skull and upper cervical spine: Unremarkable bone marrow signal. Sinuses/Orbits: Bilateral cataract extraction. Mild right sphenoid sinus mucosal thickening and/or fluid. Clear mastoid air cells. Other: None. MRA NECK FINDINGS Assessment is limited by lack of IV contrast. The aortic arch and proximal arch vessels were not imaged. Artifact obscures portions of the cervical carotid and vertebral arteries, mainly proximally. There is a medial course of both proximal common carotid arteries which are patent. There is no evidence of flow limiting carotid stenosis, with suspected minimal narrowing of both proximal internal carotid arteries better demonstrated on the recent prior MRA.  The vertebral arteries are patent with antegrade flow bilaterally and with the left being slightly larger than the right. The mid and distal V2 segments are widely patent, with the more proximal vertebral arteries inadequately evaluated. Signal loss in both the distal V3 and proximal V4 segments is likely artifactual. IMPRESSION: 1. No acute intracranial abnormality. 2. Moderate chronic small vessel ischemic disease, mildly progressed from 2015. 3. Limited neck MRA due to lack of IV contrast as detailed above. No significant stenosis identified. Electronically Signed   By: Logan Bores M.D.   On: 09/06/2016 15:10   US Renal  Result Date: 09/06/2016 CLINICAL DATA:  Acute renal failure EXAM: RENAL / URINARY TRACT ULTRASOUND COMPLETE COMPARISON:  Renal ultrasound of October 29, 2015 FINDINGS: Right Kidney: Length: 10.9 cm. The renal cortical echotexture remains lower than that of the adjacent liver. There is an upper pole cyst measuring 2.5 x 2.6 x 2.6 cm. There is no hydronephrosis. Left Kidney: Length: 11.4 cm. The renal cortical echotexture is similar to that on the right. There is no focal mass or hydronephrosis. Bladder: Urinary bladder is decompressed with a Foley catheter. IMPRESSION: Normal renal ultrasound examination.  There is no hydronephrosis. Electronically Signed   By: David  Martinique M.D.   On: 09/06/2016 11:11     Management plans discussed with the patient, family and they are in agreement.  CODE STATUS:     Code Status Orders        Start     Ordered   09/06/16 1620  Do not attempt resuscitation (DNR)  Continuous    Question Answer Comment  In the event of cardiac or respiratory ARREST Do not call a "code blue"   In the event of cardiac or respiratory ARREST Do not perform Intubation, CPR, defibrillation or ACLS   In the event of cardiac or respiratory ARREST Use medication by any route, position, wound care, and other measures to relive pain and suffering. May use oxygen, suction and  manual treatment of airway obstruction as needed for comfort.      09/06/16 1619    Code Status History    Date Active Date Inactive Code Status Order ID Comments User Context   09/05/2016  4:22 PM 09/06/2016  4:19 PM DNR [id number]  Henreitta Leber,  MD Inpatient   08/12/2016  6:47 PM 08/16/2016  7:14 PM Full Code [id number]  Henreitta Leber, MD Inpatient   07/05/2016 12:07 AM 07/06/2016  8:06 PM Full Code [id number]  Harvie Bridge, DO Inpatient   06/27/2015  5:31 PM 06/29/2015  8:52 PM Full Code [id number]  Lytle Butte, MD ED    Advance Directive Documentation     Most Recent Value  Type of Advance Directive  Healthcare Power of Attorney, Living will  Pre-existing out of facility DNR order (yellow form or pink MOST form)  -  "MOST" Form in Place?  -      TOTAL TIME TAKING CARE OF THIS PATIENT: 38 minutes.    Gladstone Lighter M.D on 09/07/2016 at 11:03 AM  Between 7am to 6pm - Pager - [phone]  After 6pm go to [email or web address] - Proofreader  [facility]  Office  [phone]  CC: Primary care physician; Lorelee Market, MD   Note: This dictation was prepared with Dragon dictation along with smaller phrase technology. Any transcriptional errors that result from this process are unintentional.

## 2016-09-07 NOTE — Progress Notes (Signed)
Per MD and palliative NP patient is appropriate for hospice home and family chose [facility]. Per [facility] Glad hospice liaison patient can come today to the hospice home. MD aware of above. Clinical Education officer, museum (CSW) prepared EMS form including DNR. RN aware of above. Please reconsult if future social work needs arise. CSW signing off.   [employer], LCSW [phone]

## 2016-09-10 LAB — CULTURE, BLOOD (ROUTINE X 2)
CULTURE: NO GROWTH
Culture: NO GROWTH
Special Requests: ADEQUATE

## 2016-09-20 ENCOUNTER — Inpatient Hospital Stay: Payer: Medicare Other

## 2016-09-20 ENCOUNTER — Inpatient Hospital Stay: Payer: Medicare Other | Admitting: Oncology

## 2017-01-26 ENCOUNTER — Other Ambulatory Visit: Payer: Self-pay | Admitting: Neurology

## 2017-01-26 DIAGNOSIS — R531 Weakness: Secondary | ICD-10-CM

## 2017-01-26 DIAGNOSIS — R410 Disorientation, unspecified: Secondary | ICD-10-CM

## 2017-02-01 ENCOUNTER — Ambulatory Visit
Admission: RE | Admit: 2017-02-01 | Discharge: 2017-02-01 | Disposition: A | Discharge status: Home / Self Care | Payer: Medicare Other | Source: Ambulatory Visit | Attending: Neurology | Admitting: Neurology

## 2017-02-01 DIAGNOSIS — J323 Chronic sphenoidal sinusitis: Secondary | ICD-10-CM | POA: Diagnosis not present

## 2017-02-01 DIAGNOSIS — G319 Degenerative disease of nervous system, unspecified: Secondary | ICD-10-CM | POA: Insufficient documentation

## 2017-02-01 DIAGNOSIS — R410 Disorientation, unspecified: Secondary | ICD-10-CM | POA: Insufficient documentation

## 2017-02-01 DIAGNOSIS — I6782 Cerebral ischemia: Secondary | ICD-10-CM | POA: Insufficient documentation

## 2017-02-01 DIAGNOSIS — R531 Weakness: Secondary | ICD-10-CM | POA: Insufficient documentation

## 2017-04-29 DEATH — deceased
